# Patient Record
Sex: Female | Born: 1960 | Race: White | Hispanic: No | Smoking: Former smoker
Health system: Southern US, Community
[De-identification: ages and names within clinical notes are randomized; demographics above are authoritative.]

## PROBLEM LIST (undated history)

## (undated) DIAGNOSIS — E782 Mixed hyperlipidemia: Secondary | ICD-10-CM

## (undated) DIAGNOSIS — G43009 Migraine without aura, not intractable, without status migrainosus: Secondary | ICD-10-CM

## (undated) DIAGNOSIS — L551 Sunburn of second degree: Secondary | ICD-10-CM

## (undated) DIAGNOSIS — I10 Essential (primary) hypertension: Secondary | ICD-10-CM

## (undated) DIAGNOSIS — J440 Chronic obstructive pulmonary disease with acute lower respiratory infection: Secondary | ICD-10-CM

## (undated) DIAGNOSIS — C50311 Malignant neoplasm of lower-inner quadrant of right female breast: Secondary | ICD-10-CM

## (undated) HISTORY — DX: Essential (primary) hypertension: I10

## (undated) HISTORY — DX: Sunburn of second degree: L55.1

## (undated) HISTORY — PX: ABDOMINAL HYSTERECTOMY: SHX81

## (undated) HISTORY — DX: Chronic obstructive pulmonary disease with (acute) lower respiratory infection: J44.0

## (undated) HISTORY — DX: Mixed hyperlipidemia: E78.2

## (undated) HISTORY — DX: Malignant neoplasm of lower-inner quadrant of right female breast: C50.311

## (undated) HISTORY — PX: OTHER SURGICAL HISTORY: SHX169

## (undated) HISTORY — DX: Migraine without aura, not intractable, without status migrainosus: G43.009

## (undated) HISTORY — PX: NASAL HEMORRHAGE CONTROL: SHX287

## (undated) HISTORY — PX: CHOLECYSTECTOMY: SHX55

---

## 1998-12-08 HISTORY — PX: CARDIAC CATHETERIZATION: SHX172

## 2016-03-10 DIAGNOSIS — Z6821 Body mass index (BMI) 21.0-21.9, adult: Secondary | ICD-10-CM | POA: Diagnosis not present

## 2016-03-10 DIAGNOSIS — H906 Mixed conductive and sensorineural hearing loss, bilateral: Secondary | ICD-10-CM | POA: Diagnosis not present

## 2016-03-10 DIAGNOSIS — H9190 Unspecified hearing loss, unspecified ear: Secondary | ICD-10-CM | POA: Diagnosis not present

## 2016-03-10 DIAGNOSIS — H90A31 Mixed conductive and sensorineural hearing loss, unilateral, right ear with restricted hearing on the contralateral side: Secondary | ICD-10-CM | POA: Diagnosis not present

## 2016-03-10 DIAGNOSIS — Z974 Presence of external hearing-aid: Secondary | ICD-10-CM | POA: Diagnosis not present

## 2016-03-10 DIAGNOSIS — H90A22 Sensorineural hearing loss, unilateral, left ear, with restricted hearing on the contralateral side: Secondary | ICD-10-CM | POA: Diagnosis not present

## 2016-04-01 DIAGNOSIS — J9801 Acute bronchospasm: Secondary | ICD-10-CM | POA: Diagnosis not present

## 2016-04-01 DIAGNOSIS — J069 Acute upper respiratory infection, unspecified: Secondary | ICD-10-CM | POA: Diagnosis not present

## 2016-04-09 DIAGNOSIS — R290 Tetany: Secondary | ICD-10-CM | POA: Diagnosis not present

## 2016-04-09 DIAGNOSIS — G4452 New daily persistent headache (NDPH): Secondary | ICD-10-CM | POA: Diagnosis not present

## 2016-04-28 DIAGNOSIS — L550 Sunburn of first degree: Secondary | ICD-10-CM | POA: Diagnosis not present

## 2016-06-02 DIAGNOSIS — R1084 Generalized abdominal pain: Secondary | ICD-10-CM | POA: Diagnosis not present

## 2016-06-02 DIAGNOSIS — R0789 Other chest pain: Secondary | ICD-10-CM | POA: Diagnosis not present

## 2016-06-02 DIAGNOSIS — R05 Cough: Secondary | ICD-10-CM | POA: Diagnosis not present

## 2016-06-02 DIAGNOSIS — R0602 Shortness of breath: Secondary | ICD-10-CM | POA: Diagnosis not present

## 2016-06-02 DIAGNOSIS — R079 Chest pain, unspecified: Secondary | ICD-10-CM | POA: Diagnosis not present

## 2016-06-16 DIAGNOSIS — E782 Mixed hyperlipidemia: Secondary | ICD-10-CM | POA: Diagnosis not present

## 2016-06-16 DIAGNOSIS — G43009 Migraine without aura, not intractable, without status migrainosus: Secondary | ICD-10-CM | POA: Diagnosis not present

## 2016-06-16 DIAGNOSIS — F419 Anxiety disorder, unspecified: Secondary | ICD-10-CM | POA: Diagnosis not present

## 2016-06-16 DIAGNOSIS — I10 Essential (primary) hypertension: Secondary | ICD-10-CM | POA: Diagnosis not present

## 2016-06-16 DIAGNOSIS — R5383 Other fatigue: Secondary | ICD-10-CM | POA: Diagnosis not present

## 2016-06-16 DIAGNOSIS — L821 Other seborrheic keratosis: Secondary | ICD-10-CM | POA: Diagnosis not present

## 2016-07-03 DIAGNOSIS — R290 Tetany: Secondary | ICD-10-CM | POA: Diagnosis not present

## 2016-07-03 DIAGNOSIS — Z6820 Body mass index (BMI) 20.0-20.9, adult: Secondary | ICD-10-CM | POA: Diagnosis not present

## 2016-07-03 DIAGNOSIS — G43009 Migraine without aura, not intractable, without status migrainosus: Secondary | ICD-10-CM | POA: Diagnosis not present

## 2016-08-13 DIAGNOSIS — L820 Inflamed seborrheic keratosis: Secondary | ICD-10-CM | POA: Diagnosis not present

## 2016-08-13 DIAGNOSIS — L821 Other seborrheic keratosis: Secondary | ICD-10-CM | POA: Diagnosis not present

## 2016-08-13 DIAGNOSIS — D2239 Melanocytic nevi of other parts of face: Secondary | ICD-10-CM | POA: Diagnosis not present

## 2016-10-16 DIAGNOSIS — R799 Abnormal finding of blood chemistry, unspecified: Secondary | ICD-10-CM | POA: Diagnosis not present

## 2016-10-16 DIAGNOSIS — E782 Mixed hyperlipidemia: Secondary | ICD-10-CM | POA: Diagnosis not present

## 2016-10-23 DIAGNOSIS — J9801 Acute bronchospasm: Secondary | ICD-10-CM | POA: Diagnosis not present

## 2016-10-23 DIAGNOSIS — J060 Acute laryngopharyngitis: Secondary | ICD-10-CM | POA: Diagnosis not present

## 2016-10-23 DIAGNOSIS — J069 Acute upper respiratory infection, unspecified: Secondary | ICD-10-CM | POA: Diagnosis not present

## 2016-12-19 DIAGNOSIS — J180 Bronchopneumonia, unspecified organism: Secondary | ICD-10-CM | POA: Diagnosis not present

## 2016-12-19 DIAGNOSIS — J111 Influenza due to unidentified influenza virus with other respiratory manifestations: Secondary | ICD-10-CM | POA: Diagnosis not present

## 2016-12-30 DIAGNOSIS — G43009 Migraine without aura, not intractable, without status migrainosus: Secondary | ICD-10-CM | POA: Diagnosis not present

## 2016-12-30 DIAGNOSIS — I10 Essential (primary) hypertension: Secondary | ICD-10-CM | POA: Diagnosis not present

## 2016-12-30 DIAGNOSIS — F411 Generalized anxiety disorder: Secondary | ICD-10-CM | POA: Diagnosis not present

## 2016-12-30 DIAGNOSIS — R5383 Other fatigue: Secondary | ICD-10-CM | POA: Diagnosis not present

## 2016-12-30 DIAGNOSIS — E782 Mixed hyperlipidemia: Secondary | ICD-10-CM | POA: Diagnosis not present

## 2016-12-30 DIAGNOSIS — K219 Gastro-esophageal reflux disease without esophagitis: Secondary | ICD-10-CM | POA: Diagnosis not present

## 2017-01-14 DIAGNOSIS — R0789 Other chest pain: Secondary | ICD-10-CM | POA: Diagnosis not present

## 2017-01-14 DIAGNOSIS — Z79899 Other long term (current) drug therapy: Secondary | ICD-10-CM | POA: Diagnosis not present

## 2017-01-14 DIAGNOSIS — R079 Chest pain, unspecified: Secondary | ICD-10-CM | POA: Diagnosis not present

## 2017-01-14 DIAGNOSIS — M79601 Pain in right arm: Secondary | ICD-10-CM | POA: Diagnosis not present

## 2017-05-19 DIAGNOSIS — L551 Sunburn of second degree: Secondary | ICD-10-CM | POA: Diagnosis not present

## 2017-05-25 DIAGNOSIS — W1789XA Other fall from one level to another, initial encounter: Secondary | ICD-10-CM | POA: Diagnosis not present

## 2017-05-25 DIAGNOSIS — S99922A Unspecified injury of left foot, initial encounter: Secondary | ICD-10-CM | POA: Diagnosis not present

## 2017-05-25 DIAGNOSIS — M79672 Pain in left foot: Secondary | ICD-10-CM | POA: Diagnosis not present

## 2017-05-25 DIAGNOSIS — S0990XA Unspecified injury of head, initial encounter: Secondary | ICD-10-CM | POA: Diagnosis not present

## 2017-07-20 DIAGNOSIS — I10 Essential (primary) hypertension: Secondary | ICD-10-CM | POA: Diagnosis not present

## 2017-07-20 DIAGNOSIS — E039 Hypothyroidism, unspecified: Secondary | ICD-10-CM | POA: Diagnosis not present

## 2017-07-20 DIAGNOSIS — E782 Mixed hyperlipidemia: Secondary | ICD-10-CM | POA: Diagnosis not present

## 2017-07-31 DIAGNOSIS — E782 Mixed hyperlipidemia: Secondary | ICD-10-CM | POA: Diagnosis not present

## 2017-07-31 DIAGNOSIS — G43009 Migraine without aura, not intractable, without status migrainosus: Secondary | ICD-10-CM | POA: Diagnosis not present

## 2017-07-31 DIAGNOSIS — J060 Acute laryngopharyngitis: Secondary | ICD-10-CM | POA: Diagnosis not present

## 2017-07-31 DIAGNOSIS — F411 Generalized anxiety disorder: Secondary | ICD-10-CM | POA: Diagnosis not present

## 2017-08-13 DIAGNOSIS — R197 Diarrhea, unspecified: Secondary | ICD-10-CM | POA: Diagnosis not present

## 2017-08-16 DIAGNOSIS — R197 Diarrhea, unspecified: Secondary | ICD-10-CM | POA: Diagnosis not present

## 2017-09-04 DIAGNOSIS — R197 Diarrhea, unspecified: Secondary | ICD-10-CM | POA: Diagnosis not present

## 2017-09-04 DIAGNOSIS — Z1211 Encounter for screening for malignant neoplasm of colon: Secondary | ICD-10-CM | POA: Diagnosis not present

## 2017-09-07 LAB — HM COLONOSCOPY

## 2017-09-22 DIAGNOSIS — K589 Irritable bowel syndrome without diarrhea: Secondary | ICD-10-CM | POA: Diagnosis not present

## 2017-09-28 DIAGNOSIS — Z1231 Encounter for screening mammogram for malignant neoplasm of breast: Secondary | ICD-10-CM | POA: Diagnosis not present

## 2017-10-06 DIAGNOSIS — R922 Inconclusive mammogram: Secondary | ICD-10-CM | POA: Diagnosis not present

## 2017-10-06 DIAGNOSIS — R928 Other abnormal and inconclusive findings on diagnostic imaging of breast: Secondary | ICD-10-CM | POA: Diagnosis not present

## 2017-11-03 DIAGNOSIS — I10 Essential (primary) hypertension: Secondary | ICD-10-CM | POA: Diagnosis not present

## 2017-11-03 DIAGNOSIS — R5383 Other fatigue: Secondary | ICD-10-CM | POA: Diagnosis not present

## 2017-11-03 DIAGNOSIS — K219 Gastro-esophageal reflux disease without esophagitis: Secondary | ICD-10-CM | POA: Diagnosis not present

## 2017-11-03 DIAGNOSIS — E782 Mixed hyperlipidemia: Secondary | ICD-10-CM | POA: Diagnosis not present

## 2017-11-03 DIAGNOSIS — J060 Acute laryngopharyngitis: Secondary | ICD-10-CM | POA: Diagnosis not present

## 2018-03-01 DIAGNOSIS — L2089 Other atopic dermatitis: Secondary | ICD-10-CM | POA: Diagnosis not present

## 2018-04-15 DIAGNOSIS — J060 Acute laryngopharyngitis: Secondary | ICD-10-CM | POA: Diagnosis not present

## 2018-05-06 DIAGNOSIS — R5383 Other fatigue: Secondary | ICD-10-CM | POA: Diagnosis not present

## 2018-05-06 DIAGNOSIS — E782 Mixed hyperlipidemia: Secondary | ICD-10-CM | POA: Diagnosis not present

## 2018-05-06 DIAGNOSIS — L551 Sunburn of second degree: Secondary | ICD-10-CM | POA: Diagnosis not present

## 2018-05-06 DIAGNOSIS — K219 Gastro-esophageal reflux disease without esophagitis: Secondary | ICD-10-CM | POA: Diagnosis not present

## 2018-05-06 DIAGNOSIS — I10 Essential (primary) hypertension: Secondary | ICD-10-CM | POA: Diagnosis not present

## 2018-07-05 DIAGNOSIS — E034 Atrophy of thyroid (acquired): Secondary | ICD-10-CM | POA: Diagnosis not present

## 2018-08-03 DIAGNOSIS — L821 Other seborrheic keratosis: Secondary | ICD-10-CM | POA: Diagnosis not present

## 2018-08-03 DIAGNOSIS — L728 Other follicular cysts of the skin and subcutaneous tissue: Secondary | ICD-10-CM | POA: Diagnosis not present

## 2018-08-03 DIAGNOSIS — L739 Follicular disorder, unspecified: Secondary | ICD-10-CM | POA: Diagnosis not present

## 2018-10-22 DIAGNOSIS — E038 Other specified hypothyroidism: Secondary | ICD-10-CM | POA: Diagnosis not present

## 2018-10-22 DIAGNOSIS — E782 Mixed hyperlipidemia: Secondary | ICD-10-CM | POA: Diagnosis not present

## 2018-10-22 DIAGNOSIS — I10 Essential (primary) hypertension: Secondary | ICD-10-CM | POA: Diagnosis not present

## 2018-10-25 DIAGNOSIS — K219 Gastro-esophageal reflux disease without esophagitis: Secondary | ICD-10-CM | POA: Diagnosis not present

## 2018-10-25 DIAGNOSIS — E782 Mixed hyperlipidemia: Secondary | ICD-10-CM | POA: Diagnosis not present

## 2018-10-25 DIAGNOSIS — I10 Essential (primary) hypertension: Secondary | ICD-10-CM | POA: Diagnosis not present

## 2018-11-11 DIAGNOSIS — L820 Inflamed seborrheic keratosis: Secondary | ICD-10-CM | POA: Diagnosis not present

## 2018-11-11 DIAGNOSIS — L728 Other follicular cysts of the skin and subcutaneous tissue: Secondary | ICD-10-CM | POA: Diagnosis not present

## 2019-02-11 DIAGNOSIS — Z1231 Encounter for screening mammogram for malignant neoplasm of breast: Secondary | ICD-10-CM | POA: Diagnosis not present

## 2019-02-11 LAB — HM MAMMOGRAPHY: HM Mammogram: NORMAL (ref 0–4)

## 2019-05-17 DIAGNOSIS — L551 Sunburn of second degree: Secondary | ICD-10-CM | POA: Diagnosis not present

## 2019-09-26 DIAGNOSIS — I10 Essential (primary) hypertension: Secondary | ICD-10-CM | POA: Diagnosis not present

## 2019-09-26 DIAGNOSIS — K219 Gastro-esophageal reflux disease without esophagitis: Secondary | ICD-10-CM | POA: Diagnosis not present

## 2019-09-26 DIAGNOSIS — E782 Mixed hyperlipidemia: Secondary | ICD-10-CM | POA: Diagnosis not present

## 2019-09-26 DIAGNOSIS — Z23 Encounter for immunization: Secondary | ICD-10-CM | POA: Diagnosis not present

## 2019-09-30 DIAGNOSIS — E782 Mixed hyperlipidemia: Secondary | ICD-10-CM | POA: Diagnosis not present

## 2019-09-30 DIAGNOSIS — R5383 Other fatigue: Secondary | ICD-10-CM | POA: Diagnosis not present

## 2019-09-30 DIAGNOSIS — E038 Other specified hypothyroidism: Secondary | ICD-10-CM | POA: Diagnosis not present

## 2020-01-26 ENCOUNTER — Other Ambulatory Visit: Payer: Self-pay | Admitting: Physician Assistant

## 2020-01-26 ENCOUNTER — Other Ambulatory Visit: Payer: Self-pay | Admitting: Family Medicine

## 2020-02-29 ENCOUNTER — Other Ambulatory Visit: Payer: Self-pay | Admitting: Physician Assistant

## 2020-03-26 ENCOUNTER — Ambulatory Visit: Payer: Self-pay | Admitting: Physician Assistant

## 2020-03-30 ENCOUNTER — Ambulatory Visit: Payer: Self-pay | Admitting: Physician Assistant

## 2020-03-30 ENCOUNTER — Other Ambulatory Visit: Payer: Self-pay | Admitting: Physician Assistant

## 2020-04-10 ENCOUNTER — Ambulatory Visit: Payer: Self-pay | Admitting: Physician Assistant

## 2020-04-19 ENCOUNTER — Ambulatory Visit: Payer: 59 | Admitting: Physician Assistant

## 2020-04-19 ENCOUNTER — Other Ambulatory Visit: Payer: Self-pay

## 2020-04-19 ENCOUNTER — Encounter: Payer: Self-pay | Admitting: Physician Assistant

## 2020-04-19 VITALS — BP 108/62 | HR 70 | Temp 97.5°F | Resp 16 | Wt 125.0 lb

## 2020-04-19 DIAGNOSIS — E038 Other specified hypothyroidism: Secondary | ICD-10-CM | POA: Insufficient documentation

## 2020-04-19 DIAGNOSIS — Z23 Encounter for immunization: Secondary | ICD-10-CM

## 2020-04-19 DIAGNOSIS — Z1231 Encounter for screening mammogram for malignant neoplasm of breast: Secondary | ICD-10-CM

## 2020-04-19 DIAGNOSIS — E782 Mixed hyperlipidemia: Secondary | ICD-10-CM | POA: Insufficient documentation

## 2020-04-19 DIAGNOSIS — J060 Acute laryngopharyngitis: Secondary | ICD-10-CM | POA: Insufficient documentation

## 2020-04-19 DIAGNOSIS — F419 Anxiety disorder, unspecified: Secondary | ICD-10-CM

## 2020-04-19 DIAGNOSIS — D229 Melanocytic nevi, unspecified: Secondary | ICD-10-CM

## 2020-04-19 HISTORY — DX: Other specified hypothyroidism: E03.8

## 2020-04-19 HISTORY — DX: Acute laryngopharyngitis: J06.0

## 2020-04-19 HISTORY — DX: Melanocytic nevi, unspecified: D22.9

## 2020-04-19 HISTORY — DX: Encounter for immunization: Z23

## 2020-04-19 HISTORY — DX: Anxiety disorder, unspecified: F41.9

## 2020-04-19 MED ORDER — TRIAMCINOLONE ACETONIDE 40 MG/ML IJ SUSP
60.0000 mg | Freq: Once | INTRAMUSCULAR | Status: AC
  Administered 2020-04-19: 60 mg via INTRAMUSCULAR

## 2020-04-19 MED ORDER — ALBUTEROL SULFATE HFA 108 (90 BASE) MCG/ACT IN AERS
2.0000 | INHALATION_SPRAY | Freq: Four times a day (QID) | RESPIRATORY_TRACT | 2 refills | Status: DC | PRN
Start: 2020-04-19 — End: 2021-11-05

## 2020-04-19 MED ORDER — AZITHROMYCIN 250 MG PO TABS: ORAL_TABLET | ORAL | 0 refills | Status: DC

## 2020-04-19 NOTE — Assessment & Plan Note (Signed)
Well controlled.  ?No changes to medicines.  ?Continue to work on eating a healthy diet and exercise.  ?Labs drawn today.  ?

## 2020-04-19 NOTE — Assessment & Plan Note (Signed)
Continue current meds as directed 

## 2020-04-19 NOTE — Assessment & Plan Note (Signed)
Kenalog 60 given IM otc decongestants rx for zpack

## 2020-04-19 NOTE — Assessment & Plan Note (Signed)
Referral to dermatology.  

## 2020-04-19 NOTE — Assessment & Plan Note (Signed)
Mammogram scheduled.

## 2020-04-19 NOTE — Progress Notes (Signed)
Established Patient Office Visit  Subjective:  Patient ID: Lori Wise, female    DOB: [birth year]  Age: 59 y.o. MRN: [id number]  CC:  Chief Complaint  Patient presents with  . Hypothyroidism  . Hyperlipidemia  . Hypertension    HPI Lori Wise presents for follow up hypertension  Pt presents for follow up of hypertension. . The patient is tolerating the medication well without side effects. Compliance with treatment has been good; including taking medication as directed , maintains a healthy diet and regular exercise regimen , and following up as directed. Is currently on tenormin 50m qd  Mixed hyperlipidemia  Pt presents with hyperlipidemia.  Compliance with treatment has been good The patient is compliant with medications, maintains a low cholesterol diet , follows up as directed , and maintains an exercise regimen . The patient denies experiencing any hypercholesterolemia related symptoms. Currently on fish oil and zetia  Pt with history of anxiety - doing well on xanax as needed   Pt complains of uri symptoms and wheezing - has been going on for a awhile - has run out of her albuterol   Pt has several moles on her back and body - would like to have referral to dermatology for further evaluation  Pt would like to be scheduled for screening mammogram  Past Medical History:  Diagnosis Date  . [place]   . Essential (primary) hypertension   . Migraine without aura, not intractable, without status migrainosus   . Mixed hyperlipidemia   . Sunburn of second degree     Past Surgical History:  Procedure Laterality Date  . ABDOMINAL HYSTERECTOMY    . CARDIAC CATHETERIZATION  2000   normal  . CHOLECYSTECTOMY    . OTHER SURGICAL HISTORY     Ear surgeries x5    Family History  Problem Relation Age of Onset  . Suicidality Mother   . Lung cancer Father     Social History   Socioeconomic  History  . Marital status: Legally Separated    Spouse name: Not on file  . Number of children: 2  . Years of education: Not on file  . Highest education level: Not on file  Occupational History  . Occupation: [employer] service  Tobacco Use  . Smoking status: Current Every Day Smoker    Packs/day: 1.00    Types: Cigarettes  . Smokeless tobacco: Never Used  Substance and Sexual Activity  . Alcohol use: Not on file    Comment: Drinks approximately two times per week.  . Drug use: Never  . Sexual activity: Not on file  Other Topics Concern  . Not on file  Social History Narrative  . Not on file   Social Determinants of Health   Financial Resource Strain:   . Difficulty of Paying Living Expenses:   Food Insecurity:   . Worried About RCharity fundraiserin the Last Year:   . RArboriculturistin the Last Year:   Transportation Needs:   . LFilm/video editor(Medical):   .Marland KitchenLack of Transportation (Non-Medical):   Physical Activity:   . Days of Exercise per Week:   . Minutes of Exercise per Session:   Stress:   . Feeling of Stress :   Social Connections:   . Frequency of Communication with Friends and Family:   . Frequency of Social Gatherings with Friends and Family:   . Attends Religious Services:   .  Active Member of Clubs or Organizations:   . Attends Archivist Meetings:   Marland Kitchen Marital Status:   Intimate Partner Violence:   . Fear of Current or Ex-Partner:   . Emotionally Abused:   Marland Kitchen Physically Abused:   . Sexually Abused:      Current Outpatient Medications:  .  albuterol (VENTOLIN HFA) 108 (90 Base) MCG/ACT inhaler, Inhale 2 puffs into the lungs every 6 (six) hours as needed for wheezing or shortness of breath., Disp: 8 g, Rfl: 2 .  ALPRAZolam (XANAX) 0.5 MG tablet, TAKE ONE TABLET BY MOUTH 3 TIMES DAILY, Disp: 90 tablet, Rfl: 3 .  aspirin 81 MG chewable tablet, Chew 81 mg by mouth daily., Disp: , Rfl:  .  atenolol (TENORMIN) 25 MG tablet, TAKE 2  TABLETS BY MOUTH EVERY DAY *MUST HAVE APPOINTMENT*, Disp: 60 tablet, Rfl: 2 .  azithromycin (ZITHROMAX) 250 MG tablet, 2 po day one then 1 po days 2-5, Disp: 6 tablet, Rfl: 0 .  ezetimibe (ZETIA) 10 MG tablet, TAKE ONE TABLET BY MOUTH DAILY, Disp: 30 tablet, Rfl: 5 .  ibuprofen (ADVIL) 800 MG tablet, Take 800 mg by mouth every 8 (eight) hours as needed., Disp: , Rfl:  .  levothyroxine (SYNTHROID) 25 MCG tablet, TAKE ONE TABLET BY MOUTH EVERY DAY, Disp: 30 tablet, Rfl: 2 .  Omega-3 1000 MG CAPS, Take by mouth., Disp: , Rfl:  .  omeprazole (PRILOSEC) 20 MG capsule, TAKE ONE CAPSULE BY MOUTH EVERY DAY, Disp: 90 capsule, Rfl: 1 .  PREMARIN 0.625 MG tablet, Take 0.625 mg by mouth daily., Disp: , Rfl:    Allergies  Allergen Reactions  . Codeine Hives, Itching, Other (See Comments), Rash and Nausea And Vomiting    ROS CONSTITUTIONAL: Negative for chills, fatigue, fever, unintentional weight gain and unintentional weight loss.  E/N/T: see ENT CARDIOVASCULAR: Negative for chest pain, dizziness, palpitations and pedal edema.  RESPIRATORY: see ENT GASTROINTESTINAL: Negative for abdominal pain, acid reflux symptoms, constipation, diarrhea, nausea and vomiting.  MSK: Negative for arthralgias and myalgias.  INTEGUMENTARY: Negative for rash.  NEUROLOGICAL: Negative for dizziness and headaches.  PSYCHIATRIC: Negative for sleep disturbance and to question depression screen.  Negative for depression, negative for anhedonia.        Objective:    PHYSICAL EXAM:   VS: BP 108/62   Pulse 70   Temp (!) 97.5 F (36.4 C)   Resp 16   Wt 125 lb (56.7 kg)   SpO2 98%   GEN: Well nourished, well developed, in no acute distress  HEENT: normal external ears and nose - normal external auditory canals and TMS - hearing grossly normal -  - Lips, Teeth and Gums - normal  Oropharynx - normal mucosa, palate, and posterior pharynx Neck: no JVD or masses - no thyromegaly Cardiac: RRR; no murmurs, rubs, or  gallops,no edema - no significant varicosities Respiratory:  normal respiratory rate and pattern with no distress -scattered wheezes noted  MS: no deformity or atrophy  Skin: warm and dry, no rash -  Neuro:  Alert and Oriented x 3, Strength and sensation are intact - CN II-Xii grossly intact Psych: euthymic mood, appropriate affect and demeanor  BP 108/62   Pulse 70   Temp (!) 97.5 F (36.4 C)   Resp 16   Wt 125 lb (56.7 kg)   SpO2 98%  Wt Readings from Last 3 Encounters:  04/19/20 125 lb (56.7 kg)     Health Maintenance Due  Topic Date Due  .  Hepatitis C Screening  Never done  . HIV Screening  Never done  . COVID-19 Vaccine (1) Never done  . PAP SMEAR-Modifier  Never done    There are no preventive care reminders to display for this patient.  No results found for: TSH No results found for: WBC, HGB, HCT, MCV, PLT No results found for: NA, K, CHLORIDE, CO2, GLUCOSE, BUN, CREATININE, BILITOT, ALKPHOS, AST, ALT, PROT, ALBUMIN, CALCIUM, ANIONGAP, EGFR, GFR No results found for: CHOL No results found for: HDL No results found for: LDLCALC No results found for: TRIG No results found for: CHOLHDL No results found for: HGBA1C    Assessment & Plan:   Problem List Items Addressed This Visit      Respiratory   Acute laryngopharyngitis    Kenalog 60 given IM otc decongestants rx for zpack      Relevant Medications   azithromycin (ZITHROMAX) 250 MG tablet     Endocrine   Other specified hypothyroidism    Labs pending Continue current dose of med      Relevant Orders   TSH     Musculoskeletal and Integument   Atypical nevus    Referral to dermatology      Relevant Orders   Ambulatory referral to Dermatology     Other   Mixed hyperlipidemia - Primary    Well controlled.  No changes to medicines.  Continue to work on eating a healthy diet and exercise.  Labs drawn today.        Relevant Orders   CBC with Differential/Platelet   Comprehensive  metabolic panel   Lipid panel   Anxiety    Continue current meds as directed      Visit for screening mammogram    Mammogram scheduled      Relevant Orders   MM Digital Screening      Meds ordered this encounter  Medications  . triamcinolone acetonide (KENALOG-40) injection 60 mg  . albuterol (VENTOLIN HFA) 108 (90 Base) MCG/ACT inhaler    Sig: Inhale 2 puffs into the lungs every 6 (six) hours as needed for wheezing or shortness of breath.    Dispense:  8 g    Refill:  2    Order Specific Question:   Supervising Provider    AnswerRochel Brome [id number]  . azithromycin (ZITHROMAX) 250 MG tablet    Sig: 2 po day one then 1 po days 2-5    Dispense:  6 tablet    Refill:  0    Order Specific Question:   Supervising Provider    AnswerShelton Silvas    Follow-up: Return in about 6 months (around 10/20/2020) for chronic fasting follow up.    SARA R Gibran Veselka, [name]

## 2020-04-19 NOTE — Assessment & Plan Note (Signed)
Labs pending Continue current dose of med

## 2020-04-20 LAB — COMPREHENSIVE METABOLIC PANEL
ALT: 7 IU/L (ref 0–32)
AST: 13 IU/L (ref 0–40)
Albumin/Globulin Ratio: 2.2 (ref 1.2–2.2)
Albumin: 4.4 g/dL (ref 3.8–4.9)
Alkaline Phosphatase: 74 IU/L (ref 39–117)
BUN/Creatinine Ratio: 14 (ref 12–28)
BUN: 10 mg/dL (ref 8–27)
Bilirubin Total: 0.3 mg/dL (ref 0.0–1.2)
CO2: 23 mmol/L (ref 20–29)
Calcium: 9.6 mg/dL (ref 8.7–10.3)
Chloride: 105 mmol/L (ref 96–106)
Creatinine, Ser: 0.69 mg/dL (ref 0.57–1.00)
GFR calc Af Amer: 109 mL/min/{1.73_m2} (ref >59)
GFR calc non Af Amer: 95 mL/min/{1.73_m2} (ref >59)
Globulin, Total: 2 g/dL (ref 1.5–4.5)
Glucose: 89 mg/dL (ref 65–99)
Potassium: 4.2 mmol/L (ref 3.5–5.2)
Sodium: 140 mmol/L (ref 134–144)
Total Protein: 6.4 g/dL (ref 6.0–8.5)

## 2020-04-20 LAB — CBC WITH DIFFERENTIAL/PLATELET
Basophils Absolute: 0.1 10*3/uL (ref 0.0–0.2)
Basos: 1 %
EOS (ABSOLUTE): 0.1 10*3/uL (ref 0.0–0.4)
Eos: 1 %
Hematocrit: 39 % (ref 34.0–46.6)
Hemoglobin: 13.3 g/dL (ref 11.1–15.9)
Immature Grans (Abs): 0 10*3/uL (ref 0.0–0.1)
Immature Granulocytes: 0 %
Lymphocytes Absolute: 3.7 10*3/uL — ABNORMAL HIGH (ref 0.7–3.1)
Lymphs: 37 %
MCH: 30.9 pg (ref 26.6–33.0)
MCHC: 34.1 g/dL (ref 31.5–35.7)
MCV: 91 fL (ref 79–97)
Monocytes Absolute: 0.7 10*3/uL (ref 0.1–0.9)
Monocytes: 7 %
Neutrophils Absolute: 5.4 10*3/uL (ref 1.4–7.0)
Neutrophils: 54 %
Platelets: 340 10*3/uL (ref 150–450)
RBC: 4.3 x10E6/uL (ref 3.77–5.28)
RDW: 13 % (ref 11.7–15.4)
WBC: 10 10*3/uL (ref 3.4–10.8)

## 2020-04-20 LAB — LIPID PANEL
Chol/HDL Ratio: 3.6 ratio (ref 0.0–4.4)
Cholesterol, Total: 216 mg/dL — ABNORMAL HIGH (ref 100–199)
HDL: 60 mg/dL (ref >39)
LDL Chol Calc (NIH): 129 mg/dL — ABNORMAL HIGH (ref 0–99)
Triglycerides: 151 mg/dL — ABNORMAL HIGH (ref 0–149)
VLDL Cholesterol Cal: 27 mg/dL (ref 5–40)

## 2020-04-20 LAB — TSH: TSH: 1.47 u[IU]/mL (ref 0.450–4.500)

## 2020-04-20 LAB — CARDIOVASCULAR RISK ASSESSMENT

## 2020-04-30 ENCOUNTER — Telehealth (INDEPENDENT_AMBULATORY_CARE_PROVIDER_SITE_OTHER): Payer: 59 | Admitting: Physician Assistant

## 2020-04-30 ENCOUNTER — Encounter: Payer: Self-pay | Admitting: Physician Assistant

## 2020-04-30 ENCOUNTER — Other Ambulatory Visit: Payer: Self-pay | Admitting: Physician Assistant

## 2020-04-30 VITALS — BP 127/75 | HR 84 | Temp 98.0°F | Ht 64.0 in

## 2020-04-30 DIAGNOSIS — J40 Bronchitis, not specified as acute or chronic: Secondary | ICD-10-CM

## 2020-04-30 HISTORY — DX: Bronchitis, not specified as acute or chronic: J40

## 2020-04-30 MED ORDER — AMOXICILLIN-POT CLAVULANATE 875-125 MG PO TABS: 1.0000 | ORAL_TABLET | Freq: Two times a day (BID) | ORAL | 0 refills | Status: DC

## 2020-04-30 MED ORDER — BENZONATATE 200 MG PO CAPS: 200.0000 mg | ORAL_CAPSULE | Freq: Two times a day (BID) | ORAL | 1 refills | Status: DC | PRN

## 2020-04-30 MED ORDER — PREDNISONE 20 MG PO TABS: ORAL_TABLET | ORAL | 0 refills | Status: DC

## 2020-04-30 NOTE — Assessment & Plan Note (Signed)
rx for augmentin, prednisone and tessalon Follow up if any symptoms change or worsen

## 2020-04-30 NOTE — Progress Notes (Signed)
Virtual Visit via Telephone Note   This visit type was conducted due to national recommendations for restrictions regarding the COVID-19 Pandemic (e.g. social distancing) in an effort to limit this patient's exposure and mitigate transmission in our community.  Due to her co-morbid illnesses, this patient is at least at moderate risk for complications without adequate follow up.  This format is felt to be most appropriate for this patient at this time.  The patient did not have access to video technology/had technical difficulties with video requiring transitioning to audio format only (telephone).  All issues noted in this document were discussed and addressed.  No physical exam could be performed with this format.  Patient verbally consented to a telehealth visit.   Date:  04/30/2020   ID:  [name], DOB [birth year], MRN [id number]  Patient Location: Home Provider Location: Office  PCP:  Marge Duncans, [name]     Chief Complaint:  Cough and congestion  History of Present Illness:    Lori Wise is a 59 y.o. female with cough --- pt was seen in office last week with allergy and uri symptoms - she states her coughing has worsened and now having more congestion in chest - she states she has not had a fever, her congestion is clear and she has not had malaise  The patient does not have symptoms concerning for COVID-19 infection (fever, chills, cough, or new shortness of breath).    Past Medical History:  Diagnosis Date  . Chronic obstructive pulmonary disease with (acute) lower respiratory infection ([place])   . Essential (primary) hypertension   . Migraine without aura, not intractable, without status migrainosus   . Mixed hyperlipidemia   . Sunburn of second degree    Past Surgical History:  Procedure Laterality Date  . ABDOMINAL HYSTERECTOMY    . CARDIAC CATHETERIZATION  2000   normal  . CHOLECYSTECTOMY    . OTHER SURGICAL HISTORY     Ear surgeries x5     Current Meds   Medication Sig  . albuterol (VENTOLIN HFA) 108 (90 Base) MCG/ACT inhaler Inhale 2 puffs into the lungs every 6 (six) hours as needed for wheezing or shortness of breath.  . ALPRAZolam (XANAX) 0.5 MG tablet TAKE ONE TABLET BY MOUTH 3 TIMES DAILY  . aspirin 81 MG chewable tablet Chew 81 mg by mouth daily.  Marland Kitchen atenolol (TENORMIN) 25 MG tablet TAKE 2 TABLETS BY MOUTH EVERY DAY *MUST HAVE APPOINTMENT*  . ezetimibe (ZETIA) 10 MG tablet TAKE ONE TABLET BY MOUTH DAILY  . ibuprofen (ADVIL) 800 MG tablet Take 800 mg by mouth every 8 (eight) hours as needed.  Marland Kitchen levothyroxine (SYNTHROID) 25 MCG tablet TAKE ONE TABLET BY MOUTH EVERY DAY  . Omega-3 1000 MG CAPS Take by mouth.  Marland Kitchen omeprazole (PRILOSEC) 20 MG capsule TAKE ONE CAPSULE BY MOUTH EVERY DAY  . PREMARIN 0.625 MG tablet Take 0.625 mg by mouth daily.     Allergies:   Codeine   Social History   Tobacco Use  . Smoking status: Current Every Day Smoker    Packs/day: 1.00    Types: Cigarettes  . Smokeless tobacco: Never Used  Substance Use Topics  . Alcohol use: Not on file    Comment: Drinks approximately two times per week.  . Drug use: Never     Family Hx: The patient's family history includes Lung cancer in her father; Suicidality in her mother.  ROS:   Please see the history of present illness.  All other systems reviewed and are negative.  Labs/Other Tests and Data Reviewed:    Recent Labs: 04/19/2020: ALT 7; BUN 10; Creatinine, Ser 0.69; Hemoglobin 13.3; Platelets 340; Potassium 4.2; Sodium 140; TSH 1.470   Recent Lipid Panel Lab Results  Component Value Date/Time   CHOL 216 (H) 04/19/2020 10:57 AM   TRIG 151 (H) 04/19/2020 10:57 AM   HDL 60 04/19/2020 10:57 AM   CHOLHDL 3.6 04/19/2020 10:57 AM   LDLCALC 129 (H) 04/19/2020 10:57 AM    Wt Readings from Last 3 Encounters:  04/19/20 125 lb (56.7 kg)     Objective:    Vital Signs:  BP 127/75   Pulse 84   Temp 98 F (36.7 C)   Ht 5\' 4"  (1.626 m)   BMI 21.46 kg/m     VITAL SIGNS:  reviewed  ASSESSMENT & PLAN:    1. Bronchitis --- will send in rx for augmentin, tessalon and prednisone --- to continue albuterol as directed -  Follow up if any symptoms change or worsen  COVID-19 Education: The signs and symptoms of COVID-19 were discussed with the patient and how to seek care for testing (follow up with PCP or arrange E-visit). The importance of social distancing was discussed today.  Time:   Today, I have spen10 minutes with the patient with telehealth technology discussing the above problems.     Medication Adjustments/Labs and Tests Ordered: Current medicines are reviewed at length with the patient today.  Concerns regarding medicines are outlined above.   Tests Ordered: No orders of the defined types were placed in this encounter.   Medication Changes: Meds ordered this encounter  Medications  . predniSONE (DELTASONE) 20 MG tablet    Sig: 1 po tid for 3 days 1 po bid for 3 days 1 po qd for 3 days    Dispense:  18 tablet    Refill:  0    Order Specific Question:   Supervising Provider    AnswerRochel Brome [id number]  . amoxicillin-clavulanate (AUGMENTIN) 875-125 MG tablet    Sig: Take 1 tablet by mouth 2 (two) times daily.    Dispense:  20 tablet    Refill:  0    Order Specific Question:   Supervising Provider    AnswerRochel Brome [id number]  . benzonatate (TESSALON) 200 MG capsule    Sig: Take 1 capsule (200 mg total) by mouth 2 (two) times daily as needed for cough.    Dispense:  20 capsule    Refill:  1    Order Specific Question:   Supervising Provider    AnswerShelton Silvas    Follow Up:  In Person prn  Signed, [name], [name]  04/30/2020 11:13 AM    [place]

## 2020-05-08 ENCOUNTER — Other Ambulatory Visit: Payer: Self-pay

## 2020-05-08 ENCOUNTER — Encounter: Payer: Self-pay | Admitting: Physician Assistant

## 2020-05-08 ENCOUNTER — Ambulatory Visit (INDEPENDENT_AMBULATORY_CARE_PROVIDER_SITE_OTHER): Payer: 59 | Admitting: Physician Assistant

## 2020-05-08 VITALS — BP 118/72 | HR 72 | Temp 98.4°F | Ht 64.0 in | Wt 122.8 lb

## 2020-05-08 DIAGNOSIS — J40 Bronchitis, not specified as acute or chronic: Secondary | ICD-10-CM

## 2020-05-08 MED ORDER — IPRATROPIUM-ALBUTEROL 0.5-2.5 (3) MG/3ML IN SOLN: RESPIRATORY_TRACT | 0 refills | Status: DC

## 2020-05-08 MED ORDER — PREDNISONE 20 MG PO TABS: ORAL_TABLET | ORAL | 0 refills | Status: DC

## 2020-05-08 MED ORDER — BUDESONIDE-FORMOTEROL FUMARATE 160-4.5 MCG/ACT IN AERO
2.0000 | INHALATION_SPRAY | Freq: Two times a day (BID) | RESPIRATORY_TRACT | 3 refills | Status: DC
Start: 2020-05-08 — End: 2020-08-24

## 2020-05-08 MED ORDER — CEFTRIAXONE SODIUM 1 G IJ SOLR: 1.0000 g | Freq: Once | INTRAMUSCULAR | Status: DC

## 2020-05-08 MED ORDER — LEVOFLOXACIN 500 MG PO TABS: 500.0000 mg | ORAL_TABLET | Freq: Every day | ORAL | 0 refills | Status: DC

## 2020-05-08 MED ORDER — BENZONATATE 200 MG PO CAPS: 200.0000 mg | ORAL_CAPSULE | Freq: Two times a day (BID) | ORAL | 1 refills | Status: DC | PRN

## 2020-05-08 NOTE — Progress Notes (Signed)
Acute Office Visit  Subjective:    Patient ID: Lori Wise, female    DOB: 06/30/1961, 59 y.o.   MRN: [id number]  Chief Complaint  Patient presents with  . Cough    HPI Patient is in today for follow up bronchitis Pt has almost completed Augmentin and steroid dosepak - she is still having cough and wheezing -  She has run out of medication for her nebulizer and is still smoking about 1ppd She has not had a fever - cough at times productive  Past Medical History:  Diagnosis Date  . Chronic obstructive pulmonary disease with (acute) lower respiratory infection ([place])   . Essential (primary) hypertension   . Migraine without aura, not intractable, without status migrainosus   . Mixed hyperlipidemia   . Sunburn of second degree     Past Surgical History:  Procedure Laterality Date  . ABDOMINAL HYSTERECTOMY    . CARDIAC CATHETERIZATION  2000   normal  . CHOLECYSTECTOMY    . OTHER SURGICAL HISTORY     Ear surgeries x5    Family History  Problem Relation Age of Onset  . Suicidality Mother   . Lung cancer Father     Social History   Socioeconomic History  . Marital status: Legally Separated    Spouse name: Not on file  . Number of children: 2  . Years of education: Not on file  . Highest education level: Not on file  Occupational History  . Occupation: [employer] service  Tobacco Use  . Smoking status: Current Every Day Smoker    Packs/day: 1.00    Types: Cigarettes  . Smokeless tobacco: Never Used  Substance and Sexual Activity  . Alcohol use: Not on file    Comment: Drinks approximately two times per week.  . Drug use: Never  . Sexual activity: Not on file  Other Topics Concern  . Not on file  Social History Narrative  . Not on file   Social Determinants of Health   Financial Resource Strain:   . Difficulty of Paying Living Expenses:   Food Insecurity:   . Worried About Charity fundraiser in the Last Year:   . Arboriculturist in the Last Year:     Transportation Needs:   . Film/video editor (Medical):   Marland Kitchen Lack of Transportation (Non-Medical):   Physical Activity:   . Days of Exercise per Week:   . Minutes of Exercise per Session:   Stress:   . Feeling of Stress :   Social Connections:   . Frequency of Communication with Friends and Family:   . Frequency of Social Gatherings with Friends and Family:   . Attends Religious Services:   . Active Member of Clubs or Organizations:   . Attends Archivist Meetings:   Marland Kitchen Marital Status:   Intimate Partner Violence:   . Fear of Current or Ex-Partner:   . Emotionally Abused:   Marland Kitchen Physically Abused:   . Sexually Abused:      Current Outpatient Medications:  .  albuterol (VENTOLIN HFA) 108 (90 Base) MCG/ACT inhaler, Inhale 2 puffs into the lungs every 6 (six) hours as needed for wheezing or shortness of breath., Disp: 8 g, Rfl: 2 .  ALPRAZolam (XANAX) 0.5 MG tablet, TAKE ONE TABLET BY MOUTH 3 TIMES DAILY, Disp: 90 tablet, Rfl: 3 .  amoxicillin-clavulanate (AUGMENTIN) 875-125 MG tablet, Take 1 tablet by mouth 2 (two) times daily., Disp: 20 tablet, Rfl: 0 .  Ascorbic Acid (VITAMIN C PO), Take by mouth., Disp: , Rfl:  .  aspirin 81 MG chewable tablet, Chew 81 mg by mouth daily., Disp: , Rfl:  .  atenolol (TENORMIN) 25 MG tablet, TAKE 2 TABLETS BY MOUTH EVERY DAY *MUST HAVE APPOINTMENT*, Disp: 60 tablet, Rfl: 2 .  benzonatate (TESSALON) 200 MG capsule, Take 1 capsule (200 mg total) by mouth 2 (two) times daily as needed for cough., Disp: 30 capsule, Rfl: 1 .  Cyanocobalamin (VITAMIN B-12 PO), Take by mouth., Disp: , Rfl:  .  ezetimibe (ZETIA) 10 MG tablet, TAKE ONE TABLET BY MOUTH DAILY, Disp: 30 tablet, Rfl: 5 .  ibuprofen (ADVIL) 800 MG tablet, Take 800 mg by mouth every 8 (eight) hours as needed., Disp: , Rfl:  .  levothyroxine (SYNTHROID) 25 MCG tablet, TAKE ONE TABLET BY MOUTH EVERY DAY, Disp: 30 tablet, Rfl: 2 .  Omega-3 1000 MG CAPS, Take by mouth., Disp: , Rfl:  .   omeprazole (PRILOSEC) 20 MG capsule, TAKE ONE CAPSULE BY MOUTH EVERY DAY, Disp: 90 capsule, Rfl: 1 .  PREMARIN 0.625 MG tablet, Take 0.625 mg by mouth daily., Disp: , Rfl:  .  VITAMIN E PO, Take by mouth., Disp: , Rfl:  .  ipratropium-albuterol (DUONEB) 0.5-2.5 (3) MG/3ML SOLN, 1 vial in neb q 4-6 hours prn, Disp: 360 mL, Rfl: 0 .  levofloxacin (LEVAQUIN) 500 MG tablet, Take 1 tablet (500 mg total) by mouth daily., Disp: 10 tablet, Rfl: 0 .  predniSONE (DELTASONE) 20 MG tablet, 1 po tid for 3 days 1 po bid for 3 days 1 po qd for 3 days, Disp: 18 tablet, Rfl: 0  Current Facility-Administered Medications:  .  cefTRIAXone (ROCEPHIN) injection 1 g, 1 g, Intramuscular, Once, Marge Duncans, [name]   Allergies  Allergen Reactions  . Codeine Hives, Itching, Other (See Comments), Rash and Nausea And Vomiting    CONSTITUTIONAL: Negative for chills, fatigue, fever, unintentional weight gain and unintentional weight loss.  E/N/T: Negative for ear pain, nasal congestion and sore throat.  CARDIOVASCULAR: Negative for chest pain, dizziness, palpitations and pedal edema.  RESPIRATORY: see HPI  NEUROLOGICAL: Negative for dizziness and headaches.  PSYCHIATRIC: Negative for sleep disturbance and to question depression screen.  Negative for depression, negative for anhedonia.         Objective:    PHYSICAL EXAM:   VS: BP 118/72 (BP Location: Left Arm, Patient Position: Sitting)   Pulse 72   Temp 98.4 F (36.9 C) (Temporal)   Ht 5\' 4"  (1.626 m)   Wt 122 lb 12.8 oz (55.7 kg)   SpO2 95%   BMI 21.08 kg/m   GEN: Well nourished, well developed, in no acute distress   Oropharynx - normal mucosa, palate, and posterior pharynx  Cardiac: RRR; no murmurs, rubs, or gallops,no edema - no significant varicosities Respiratory:  scattered rhonchi and wheezes noted  Skin: warm and dry, no rash  Neuro:  Alert and Oriented x 3, Strength and sensation are intact - CN II-Xii grossly intact Psych: euthymic mood,  appropriate affect and demeanor   Wt Readings from Last 3 Encounters:  05/08/20 122 lb 12.8 oz (55.7 kg)  04/19/20 125 lb (56.7 kg)    Health Maintenance Due  Topic Date Due  . Hepatitis C Screening  Never done  . COVID-19 Vaccine (1) Never done  . HIV Screening  Never done  . PAP SMEAR-Modifier  Never done    There are no preventive care reminders to display for this  patient.        Assessment & Plan:   Problem List Items Addressed This Visit      Respiratory   Bronchitis - Primary    Chest xray ordered Rocephin 1 gram IM rx for levaquin Sample given of breztri - 2 inh twice daily Use nebulizer as directed STOP SMOKING      Relevant Medications   cefTRIAXone (ROCEPHIN) injection 1 g (Start on 05/08/2020  2:00 PM)   predniSONE (DELTASONE) 20 MG tablet   levofloxacin (LEVAQUIN) 500 MG tablet   ipratropium-albuterol (DUONEB) 0.5-2.5 (3) MG/3ML SOLN   benzonatate (TESSALON) 200 MG capsule   Other Relevant Orders   DG Chest 2 View       Meds ordered this encounter  Medications  . cefTRIAXone (ROCEPHIN) injection 1 g  . predniSONE (DELTASONE) 20 MG tablet    Sig: 1 po tid for 3 days 1 po bid for 3 days 1 po qd for 3 days    Dispense:  18 tablet    Refill:  0    Order Specific Question:   Supervising Provider    AnswerRochel Brome [id number]  . levofloxacin (LEVAQUIN) 500 MG tablet    Sig: Take 1 tablet (500 mg total) by mouth daily.    Dispense:  10 tablet    Refill:  0    Order Specific Question:   Supervising Provider    AnswerRochel Brome [id number]  . ipratropium-albuterol (DUONEB) 0.5-2.5 (3) MG/3ML SOLN    Sig: 1 vial in neb q 4-6 hours prn    Dispense:  360 mL    Refill:  0    Order Specific Question:   Supervising Provider    AnswerRochel Brome [id number]  . benzonatate (TESSALON) 200 MG capsule    Sig: Take 1 capsule (200 mg total) by mouth 2 (two) times daily as needed for cough.    Dispense:  30 capsule    Refill:  1    Order Specific  Question:   Supervising Provider    Answer:   COX, KIRSTEN [id number]     [place], [name]

## 2020-05-08 NOTE — Assessment & Plan Note (Signed)
Chest xray ordered Rocephin 1 gram IM rx for levaquin Sample given of breztri - 2 inh twice daily Use nebulizer as directed STOP SMOKING

## 2020-05-22 ENCOUNTER — Ambulatory Visit: Payer: 59 | Admitting: Physician Assistant

## 2020-05-24 ENCOUNTER — Other Ambulatory Visit: Payer: Self-pay | Admitting: Physician Assistant

## 2020-05-28 ENCOUNTER — Other Ambulatory Visit: Payer: Self-pay

## 2020-05-28 ENCOUNTER — Ambulatory Visit: Payer: 59 | Admitting: Physician Assistant

## 2020-05-28 ENCOUNTER — Encounter: Payer: Self-pay | Admitting: Physician Assistant

## 2020-05-28 VITALS — BP 118/58 | HR 73 | Temp 97.5°F | Ht 64.0 in | Wt 127.0 lb

## 2020-05-28 DIAGNOSIS — J40 Bronchitis, not specified as acute or chronic: Secondary | ICD-10-CM

## 2020-05-28 NOTE — Progress Notes (Signed)
Acute Office Visit  Subjective:    Patient ID: Lori Wise, female    DOB: 07-Nov-1961, 59 y.o.   MRN: [id number]  Chief Complaint  Patient presents with   Bronchitis    2 week follow up    HPI Patient is in today for follow up bronchitis - chest xray that was done was normal Pt has finished her antibiotics and prednisone - overall states she is feeling better - not coughing as much or wheezing - states oxygen at home is normal She has had some fatigue (labs one month ago normal) but otherwise no concerns Is still smoking  Past Medical History:  Diagnosis Date   Chronic obstructive pulmonary disease with (acute) lower respiratory infection ([place])    Essential (primary) hypertension    Migraine without aura, not intractable, without status migrainosus    Mixed hyperlipidemia    Sunburn of second degree     Past Surgical History:  Procedure Laterality Date   ABDOMINAL HYSTERECTOMY     CARDIAC CATHETERIZATION  2000   normal   CHOLECYSTECTOMY     OTHER SURGICAL HISTORY     Ear surgeries x5    Family History  Problem Relation Age of Onset   Suicidality Mother    Lung cancer Father     Social History   Socioeconomic History   Marital status: Legally Separated    Spouse name: Not on file   Number of children: 2   Years of education: Not on file   Highest education level: Not on file  Occupational History   Occupation: [employer] service  Tobacco Use   Smoking status: Current Every Day Smoker    Packs/day: 1.00    Types: Cigarettes   Smokeless tobacco: Never Used  Vaping Use   Vaping Use: Never used  Substance and Sexual Activity   Alcohol use: Not on file    Comment: Drinks approximately two times per week.   Drug use: Never   Sexual activity: Not on file  Other Topics Concern   Not on file  Social History Narrative   Not on file   Social Determinants of Health   Financial Resource Strain:    Difficulty of Paying Living  Expenses:   Food Insecurity:    Worried About [place] in the Last Year:    Arboriculturist in the Last Year:   Transportation Needs:    Film/video editor (Medical):    Lack of Transportation (Non-Medical):   Physical Activity:    Days of Exercise per Week:    Minutes of Exercise per Session:   Stress:    Feeling of Stress :   Social Connections:    Frequency of Communication with Friends and Family:    Frequency of Social Gatherings with Friends and Family:    Attends Religious Services:    Active Member of Clubs or Organizations:    Attends Music therapist:    Marital Status:   Intimate Partner Violence:    Fear of Current or Ex-Partner:    Emotionally Abused:    Physically Abused:    Sexually Abused:      Current Outpatient Medications:    albuterol (VENTOLIN HFA) 108 (90 Base) MCG/ACT inhaler, Inhale 2 puffs into the lungs every 6 (six) hours as needed for wheezing or shortness of breath., Disp: 8 g, Rfl: 2   ALPRAZolam (XANAX) 0.5 MG tablet, TAKE ONE TABLET BY MOUTH 3 TIMES DAILY, Disp: 90 tablet,  Rfl: 3   Ascorbic Acid (VITAMIN C PO), Take by mouth., Disp: , Rfl:    aspirin 81 MG chewable tablet, Chew 81 mg by mouth daily., Disp: , Rfl:    atenolol (TENORMIN) 25 MG tablet, TAKE 2 TABLETS BY MOUTH EVERY DAY *MUST HAVE APPOINTMENT*, Disp: 60 tablet, Rfl: 2   benzonatate (TESSALON) 200 MG capsule, Take 1 capsule (200 mg total) by mouth 2 (two) times daily as needed for cough., Disp: 30 capsule, Rfl: 1   budesonide-formoterol (SYMBICORT) 160-4.5 MCG/ACT inhaler, Inhale 2 puffs into the lungs 2 (two) times daily., Disp: 1 Inhaler, Rfl: 3   Cyanocobalamin (VITAMIN B-12 PO), Take by mouth., Disp: , Rfl:    ezetimibe (ZETIA) 10 MG tablet, TAKE ONE TABLET BY MOUTH DAILY, Disp: 30 tablet, Rfl: 5   ibuprofen (ADVIL) 800 MG tablet, Take 800 mg by mouth every 8 (eight) hours as needed., Disp: , Rfl:    ipratropium-albuterol  (DUONEB) 0.5-2.5 (3) MG/3ML SOLN, 1 vial in neb q 4-6 hours prn, Disp: 360 mL, Rfl: 0   levothyroxine (SYNTHROID) 25 MCG tablet, TAKE ONE TABLET BY MOUTH EVERY DAY, Disp: 30 tablet, Rfl: 2   Omega-3 1000 MG CAPS, Take by mouth., Disp: , Rfl:    omeprazole (PRILOSEC) 20 MG capsule, TAKE ONE CAPSULE BY MOUTH EVERY DAY, Disp: 90 capsule, Rfl: 1   PREMARIN 0.625 MG tablet, Take 0.625 mg by mouth daily., Disp: , Rfl:    VITAMIN E PO, Take by mouth., Disp: , Rfl:    Allergies  Allergen Reactions   Codeine Hives, Itching, Other (See Comments), Rash and Nausea And Vomiting    CONSTITUTIONAL: see HPI E/N/T: Negative for ear pain, nasal congestion and sore throat.  CARDIOVASCULAR: Negative for chest pain, dizziness, palpitations and pedal edema.  RESPIRATORY: see HPI  NEUROLOGICAL: Negative for dizziness and headaches.  PSYCHIATRIC: Negative for sleep disturbance and to question depression screen.  Negative for depression, negative for anhedonia.         Objective:    PHYSICAL EXAM:   VS: BP (!) 118/58 (BP Location: Left Arm, Patient Position: Sitting)    Pulse 73    Temp (!) 97.5 F (36.4 C) (Temporal)    Ht 5\' 4"  (1.626 m)    Wt 127 lb (57.6 kg)    SpO2 98%    BMI 21.80 kg/m   GEN: Well nourished, well developed, in no acute distress   Oropharynx - normal mucosa, palate, and posterior pharynx  Cardiac: RRR; no murmurs, rubs, or gallops,no edema - no significant varicosities Respiratory:  rare exp wheezes noted  Skin: warm and dry, no rash  Neuro:  Alert and Oriented x 3, Strength and sensation are intact - CN II-Xii grossly intact Psych: euthymic mood, appropriate affect and demeanor   Wt Readings from Last 3 Encounters:  05/28/20 127 lb (57.6 kg)  05/08/20 122 lb 12.8 oz (55.7 kg)  04/19/20 125 lb (56.7 kg)    Health Maintenance Due  Topic Date Due   Hepatitis C Screening  Never done   COVID-19 Vaccine (1) Never done   HIV Screening  Never done   PAP  SMEAR-Modifier  Never done    There are no preventive care reminders to display for this patient.        Assessment & Plan:   Problem List Items Addressed This Visit      Respiratory   Bronchitis - Primary    Continue current meds as directed Stop smoking  No orders of the defined types were placed in this encounter.    SARA R Kentley Blyden, [name]

## 2020-05-28 NOTE — Assessment & Plan Note (Signed)
Continue current meds as directed Stop smoking

## 2020-05-29 ENCOUNTER — Encounter: Payer: Self-pay | Admitting: Physician Assistant

## 2020-05-30 ENCOUNTER — Other Ambulatory Visit: Payer: Self-pay | Admitting: Family Medicine

## 2020-07-23 ENCOUNTER — Encounter: Payer: Self-pay | Admitting: Physician Assistant

## 2020-07-23 ENCOUNTER — Other Ambulatory Visit: Payer: Self-pay

## 2020-07-23 ENCOUNTER — Ambulatory Visit: Payer: 59 | Admitting: Physician Assistant

## 2020-07-23 VITALS — BP 134/68 | HR 69 | Temp 97.2°F | Ht 64.0 in | Wt 129.0 lb

## 2020-07-23 DIAGNOSIS — J40 Bronchitis, not specified as acute or chronic: Secondary | ICD-10-CM

## 2020-07-23 DIAGNOSIS — M79604 Pain in right leg: Secondary | ICD-10-CM | POA: Diagnosis not present

## 2020-07-23 HISTORY — DX: Pain in right leg: M79.604

## 2020-07-23 NOTE — Assessment & Plan Note (Signed)
Resolved Continue to use inhalers as directed

## 2020-07-23 NOTE — Assessment & Plan Note (Signed)
Use ibuprofen as needed

## 2020-07-23 NOTE — Progress Notes (Signed)
Acute Office Visit  Subjective:    Patient ID: Lori Wise, female    DOB: 05/28/1961, 59 y.o.   MRN: [id number]  Chief Complaint  Patient presents with   Right leg giving out    Also patient wants lungs rechecked    HPI Patient is in today for right leg pain Pt states that she lost her balance while working in her closet and got stuck behind a dresser - did cause some mild bruising on her upper leg - says overall is feeling better  Pt with recent history of bronchitis - says she still has a slight occasional cough - wanted to recheck lungs because she is getting married this weekend Denies wheezing - did have chest xray done which was normal  Past Medical History:  Diagnosis Date   Chronic obstructive pulmonary disease with (acute) lower respiratory infection ([place])    Essential (primary) hypertension    Migraine without aura, not intractable, without status migrainosus    Mixed hyperlipidemia    Sunburn of second degree     Past Surgical History:  Procedure Laterality Date   ABDOMINAL HYSTERECTOMY     CARDIAC CATHETERIZATION  2000   normal   CHOLECYSTECTOMY     OTHER SURGICAL HISTORY     Ear surgeries x5    Family History  Problem Relation Age of Onset   Suicidality Mother    Lung cancer Father     Social History   Socioeconomic History   Marital status: Legally Separated    Spouse name: Not on file   Number of children: 2   Years of education: Not on file   Highest education level: Not on file  Occupational History   Occupation: [employer] service  Tobacco Use   Smoking status: Current Every Day Smoker    Packs/day: 1.00    Types: Cigarettes   Smokeless tobacco: Never Used  Vaping Use   Vaping Use: Never used  Substance and Sexual Activity   Alcohol use: Not on file    Comment: Drinks approximately two times per week.   Drug use: Never   Sexual activity: Not on file  Other Topics Concern   Not on file  Social History  Narrative   Not on file   Social Determinants of Health   Financial Resource Strain:    Difficulty of Paying Living Expenses:   Food Insecurity:    Worried About [place] in the Last Year:    Arboriculturist in the Last Year:   Transportation Needs:    Film/video editor (Medical):    Lack of Transportation (Non-Medical):   Physical Activity:    Days of Exercise per Week:    Minutes of Exercise per Session:   Stress:    Feeling of Stress :   Social Connections:    Frequency of Communication with Friends and Family:    Frequency of Social Gatherings with Friends and Family:    Attends Religious Services:    Active Member of Clubs or Organizations:    Attends Music therapist:    Marital Status:   Intimate Partner Violence:    Fear of Current or Ex-Partner:    Emotionally Abused:    Physically Abused:    Sexually Abused:      Current Outpatient Medications:    albuterol (VENTOLIN HFA) 108 (90 Base) MCG/ACT inhaler, Inhale 2 puffs into the lungs every 6 (six) hours as needed for wheezing or shortness of  breath., Disp: 8 g, Rfl: 2   ALPRAZolam (XANAX) 0.5 MG tablet, TAKE ONE TABLET BY MOUTH 3 TIMES DAILY, Disp: 90 tablet, Rfl: 1   Ascorbic Acid (VITAMIN C PO), Take by mouth., Disp: , Rfl:    aspirin 81 MG chewable tablet, Chew 81 mg by mouth daily., Disp: , Rfl:    atenolol (TENORMIN) 25 MG tablet, TAKE 2 TABLETS BY MOUTH EVERY DAY *MUST HAVE APPOINTMENT*, Disp: 60 tablet, Rfl: 2   benzonatate (TESSALON) 200 MG capsule, Take 1 capsule (200 mg total) by mouth 2 (two) times daily as needed for cough., Disp: 30 capsule, Rfl: 1   budesonide-formoterol (SYMBICORT) 160-4.5 MCG/ACT inhaler, Inhale 2 puffs into the lungs 2 (two) times daily., Disp: 1 Inhaler, Rfl: 3   Cyanocobalamin (VITAMIN B-12 PO), Take by mouth., Disp: , Rfl:    ezetimibe (ZETIA) 10 MG tablet, TAKE ONE TABLET BY MOUTH DAILY, Disp: 30 tablet, Rfl: 5    ibuprofen (ADVIL) 800 MG tablet, Take 800 mg by mouth every 8 (eight) hours as needed., Disp: , Rfl:    ipratropium-albuterol (DUONEB) 0.5-2.5 (3) MG/3ML SOLN, 1 vial in neb q 4-6 hours prn, Disp: 360 mL, Rfl: 0   levothyroxine (SYNTHROID) 25 MCG tablet, TAKE ONE TABLET BY MOUTH EVERY DAY, Disp: 30 tablet, Rfl: 2   Omega-3 1000 MG CAPS, Take by mouth., Disp: , Rfl:    omeprazole (PRILOSEC) 20 MG capsule, TAKE ONE CAPSULE BY MOUTH EVERY DAY, Disp: 90 capsule, Rfl: 1   PREMARIN 0.625 MG tablet, Take 0.625 mg by mouth daily., Disp: , Rfl:    VITAMIN E PO, Take by mouth., Disp: , Rfl:    Allergies  Allergen Reactions   Codeine Hives, Itching, Other (See Comments), Rash and Nausea And Vomiting    CONSTITUTIONAL: Negative for chills, fatigue, fever, unintentional weight gain and unintentional weight loss.  CARDIOVASCULAR: Negative for chest pain, dizziness, palpitations and pedal edema.  RESPIRATORY: see HPI  MSK: see HPI INTEGUMENTARY: see HPI      Objective:    PHYSICAL EXAM:   VS: BP 134/68 (BP Location: Left Arm, Patient Position: Sitting)    Pulse 69    Temp (!) 97.2 F (36.2 C) (Temporal)    Ht 5\' 4"  (1.626 m)    Wt 129 lb (58.5 kg)    SpO2 99%    BMI 22.14 kg/m   GEN: Well nourished, well developed, in no acute distress   Cardiac: RRR; no murmurs, rubs, or gallops,no edema -  Respiratory:  normal respiratory rate and pattern with no distress - normal breath sounds with no rales, rhonchi, wheezes or rubs MS: small bruises noted on upper right leg Psych: euthymic mood, appropriate affect and demeanor   Wt Readings from Last 3 Encounters:  07/23/20 129 lb (58.5 kg)  05/28/20 127 lb (57.6 kg)  05/08/20 122 lb 12.8 oz (55.7 kg)    Health Maintenance Due  Topic Date Due   Hepatitis C Screening  Never done   COVID-19 Vaccine (1) Never done   HIV Screening  Never done   PAP SMEAR-Modifier  Never done   INFLUENZA VACCINE  07/08/2020    There are no preventive  care reminders to display for this patient.        Assessment & Plan:   Problem List Items Addressed This Visit      Respiratory   Bronchitis - Primary    Resolved Continue to use inhalers as directed        Other  Right leg pain    Use ibuprofen as needed          No orders of the defined types were placed in this encounter.    SARA R Embree Brawley, [name]

## 2020-07-25 ENCOUNTER — Other Ambulatory Visit: Payer: Self-pay | Admitting: Physician Assistant

## 2020-07-25 ENCOUNTER — Other Ambulatory Visit: Payer: Self-pay | Admitting: Family Medicine

## 2020-08-07 ENCOUNTER — Other Ambulatory Visit: Payer: Self-pay | Admitting: Physician Assistant

## 2020-08-24 ENCOUNTER — Other Ambulatory Visit: Payer: Self-pay | Admitting: Physician Assistant

## 2020-08-24 ENCOUNTER — Other Ambulatory Visit: Payer: Self-pay | Admitting: Family Medicine

## 2020-09-26 ENCOUNTER — Other Ambulatory Visit: Payer: Self-pay | Admitting: Physician Assistant

## 2020-09-26 ENCOUNTER — Other Ambulatory Visit: Payer: Self-pay | Admitting: Family Medicine

## 2020-10-22 ENCOUNTER — Ambulatory Visit: Payer: 59 | Admitting: Family Medicine

## 2020-10-22 ENCOUNTER — Ambulatory Visit: Payer: 59 | Admitting: Physician Assistant

## 2020-10-22 ENCOUNTER — Other Ambulatory Visit: Payer: Self-pay

## 2020-10-22 ENCOUNTER — Encounter: Payer: Self-pay | Admitting: Family Medicine

## 2020-10-22 VITALS — BP 128/78 | HR 72 | Temp 98.0°F | Ht 64.0 in | Wt 128.0 lb

## 2020-10-22 DIAGNOSIS — Z23 Encounter for immunization: Secondary | ICD-10-CM

## 2020-10-22 DIAGNOSIS — K219 Gastro-esophageal reflux disease without esophagitis: Secondary | ICD-10-CM

## 2020-10-22 DIAGNOSIS — Z79890 Hormone replacement therapy (postmenopausal): Secondary | ICD-10-CM

## 2020-10-22 DIAGNOSIS — J449 Chronic obstructive pulmonary disease, unspecified: Secondary | ICD-10-CM

## 2020-10-22 DIAGNOSIS — E038 Other specified hypothyroidism: Secondary | ICD-10-CM | POA: Diagnosis not present

## 2020-10-22 DIAGNOSIS — F419 Anxiety disorder, unspecified: Secondary | ICD-10-CM

## 2020-10-22 DIAGNOSIS — E782 Mixed hyperlipidemia: Secondary | ICD-10-CM | POA: Diagnosis not present

## 2020-10-22 HISTORY — DX: Gastro-esophageal reflux disease without esophagitis: K21.9

## 2020-10-22 HISTORY — DX: Hormone replacement therapy: Z79.890

## 2020-10-22 HISTORY — DX: Chronic obstructive pulmonary disease, unspecified: J44.9

## 2020-10-22 MED ORDER — ESTRADIOL 0.5 MG PO TABS: 0.5000 mg | ORAL_TABLET | Freq: Every day | ORAL | 3 refills | Status: DC

## 2020-10-22 NOTE — Patient Instructions (Addendum)
Generalized Anxiety Disorder, Adult Generalized anxiety disorder (GAD) is a mental health disorder. People with this condition constantly worry about everyday events. Unlike normal anxiety, worry related to GAD is not triggered by a specific event. These worries also do not fade or get better with time. GAD interferes with life functions, including relationships, work, and school. GAD can vary from mild to severe. People with severe GAD can have intense waves of anxiety with physical symptoms (panic attacks). What are the causes? The exact cause of GAD is not known. What increases the risk? This condition is more likely to develop in:  Women.  People who have a family history of anxiety disorders.  People who are very shy.  People who experience very stressful life events, such as the death of a loved one.  People who have a very stressful family environment. What are the signs or symptoms? People with GAD often worry excessively about many things in their lives, such as their health and family. They may also be overly concerned about:  Doing well at work.  Being on time.  Natural disasters.  Friendships. Physical symptoms of GAD include:  Fatigue.  Muscle tension or having muscle twitches.  Trembling or feeling shaky.  Being easily startled.  Feeling like your heart is pounding or racing.  Feeling out of breath or like you cannot take a deep breath.  Having trouble falling asleep or staying asleep.  Sweating.  Nausea, diarrhea, or irritable bowel syndrome (IBS).  Headaches.  Trouble concentrating or remembering facts.  Restlessness.  Irritability. How is this diagnosed? Your health care provider can diagnose GAD based on your symptoms and medical history. You will also have a physical exam. The health care provider will ask specific questions about your symptoms, including how severe they are, when they started, and if they come and go. Your health care  provider may ask you about your use of alcohol or drugs, including prescription medicines. Your health care provider may refer you to a mental health specialist for further evaluation. Your health care provider will do a thorough examination and may perform additional tests to rule out other possible causes of your symptoms. To be diagnosed with GAD, a person must have anxiety that:  Is out of his or her control.  Affects several different aspects of his or her life, such as work and relationships.  Causes distress that makes him or her unable to take part in normal activities.  Includes at least three physical symptoms of GAD, such as restlessness, fatigue, trouble concentrating, irritability, muscle tension, or sleep problems. Before your health care provider can confirm a diagnosis of GAD, these symptoms must be present more days than they are not, and they must last for six months or longer. How is this treated? The following therapies are usually used to treat GAD:  Medicine. Antidepressant medicine is usually prescribed for long-term daily control. Antianxiety medicines may be added in severe cases, especially when panic attacks occur.  Talk therapy (psychotherapy). Certain types of talk therapy can be helpful in treating GAD by providing support, education, and guidance. Options include: ? Cognitive behavioral therapy (CBT). People learn coping skills and techniques to ease their anxiety. They learn to identify unrealistic or negative thoughts and behaviors and to replace them with positive ones. ? Acceptance and commitment therapy (ACT). This treatment teaches people how to be mindful as a way to cope with unwanted thoughts and feelings. ? Biofeedback. This process trains you to manage your body's response (  physiological response) through breathing techniques and relaxation methods. You will work with a therapist while machines are used to monitor your physical symptoms.  Stress  management techniques. These include yoga, meditation, and exercise. A mental health specialist can help determine which treatment is best for you. Some people see improvement with one type of therapy. However, other people require a combination of therapies. Follow these instructions at home:  Take over-the-counter and prescription medicines only as told by your health care provider.  Try to maintain a normal routine.  Try to anticipate stressful situations and allow extra time to manage them.  Practice any stress management or self-calming techniques as taught by your health care provider.  Do not punish yourself for setbacks or for not making progress.  Try to recognize your accomplishments, even if they are small.  Keep all follow-up visits as told by your health care provider. This is important. Contact a health care provider if:  Your symptoms do not get better.  Your symptoms get worse.  You have signs of depression, such as: ? A persistently sad, cranky, or irritable mood. ? Loss of enjoyment in activities that used to bring you joy. ? Change in weight or eating. ? Changes in sleeping habits. ? Avoiding friends or family members. ? Loss of energy for normal tasks. ? Feelings of guilt or worthlessness. Get help right away if:  You have serious thoughts about hurting yourself or others. If you ever feel like you may hurt yourself or others, or have thoughts about taking your own life, get help right away. You can go to your nearest emergency department or call:  Your local emergency services (911 in the [place]  A suicide crisis helpline, such as the [facility] at 787-794-3488. This is open 24 hours a day. Summary  Generalized anxiety disorder (GAD) is a mental health disorder that involves worry that is not triggered by a specific event.  People with GAD often worry excessively about many things in their lives, such as their health and  family.  GAD may cause physical symptoms such as restlessness, trouble concentrating, sleep problems, frequent sweating, nausea, diarrhea, headaches, and trembling or muscle twitching.  A mental health specialist can help determine which treatment is best for you. Some people see improvement with one type of therapy. However, other people require a combination of therapies. This information is not intended to replace advice given to you by your health care provider. Make sure you discuss any questions you have with your health care provider. Document Revised: 11/06/2017 Document Reviewed: 10/14/2016 Elsevier Patient Education  2020 [employer].  Menopause and Hormone Replacement Therapy Menopause is a normal time of life when menstrual periods stop completely and the ovaries stop producing the female hormones estrogen and progesterone. This lack of hormones can affect your health and cause undesirable symptoms. Hormone replacement therapy (HRT) can relieve some of those symptoms. What is hormone replacement therapy? HRT is the use of artificial (synthetic) hormones to replace hormones that your body has stopped producing because you have reached menopause. What are my options for HRT?  HRT may consist of the synthetic hormones estrogen and progestin, or it may consist of only estrogen (estrogen-only therapy). You and your health care provider will decide which form of HRT is best for you. If you choose to be on HRT and you have a uterus, estrogen and progestin are usually prescribed. Estrogen-only therapy is used for women who do not have a uterus. Possible options  for taking HRT include:  Pills.  Patches.  Gels.  Sprays.  Vaginal cream.  Vaginal rings.  Vaginal inserts. The amount of hormone(s) that you take and how long you take the hormone(s) varies according to your health. It is important to:  Begin HRT with the lowest possible dosage.  Stop HRT as soon as your health care  provider tells you to stop.  Work with your health care provider so that you feel informed and comfortable with your decisions. What are the benefits of HRT? HRT can reduce the frequency and severity of menopausal symptoms. Benefits of HRT vary according to the kind of symptoms that you have, how severe they are, and your overall health. HRT may help to improve the following symptoms of menopause:  Hot flashes and night sweats. These are sudden feelings of heat that spread over the face and body. The skin may turn red, like a blush. Night sweats are hot flashes that happen while you are sleeping or trying to sleep.  Bone loss (osteoporosis). The body loses calcium more quickly after menopause, causing the bones to become weaker. This can increase the risk for bone breaks (fractures).  Vaginal dryness. The lining of the vagina can become thin and dry, which can cause pain during sex or cause infection, burning, or itching.  Urinary tract infections.  Urinary incontinence. This is the inability to control when you pass urine.  Irritability.  Short-term memory problems. What are the risks of HRT? Risks of HRT vary depending on your individual health and medical history. Risks of HRT also depend on whether you receive both estrogen and progestin or you receive estrogen only. HRT may increase the risk of:  Spotting. This is when a small amount of blood leaks from the vagina unexpectedly.  Endometrial cancer. This cancer is in the lining of the uterus (endometrium).  Breast cancer.  Increased density of breast tissue. This can make it harder to find breast cancer on a breast X-ray (mammogram).  Stroke.  Heart disease.  Blood clots.  Gallbladder disease.  Liver disease. Risks of HRT can increase if you have any of the following conditions:  Endometrial cancer.  Liver disease.  Heart disease.  Breast cancer.  History of blood clots.  History of stroke. Follow these  instructions at home:  Take over-the-counter and prescription medicines only as told by your health care provider.  Get mammograms, pelvic exams, and medical checkups as often as told by your health care provider.  Have Pap tests done as often as told by your health care provider. A Pap test is sometimes called a Pap smear. It is a screening test that is used to check for signs of cancer of the cervix and vagina. A Pap test can also identify the presence of infection or precancerous changes. Pap tests may be done: ? Every 3 years, starting at age 31. ? Every 5 years, starting after age 19, in combination with testing for human papillomavirus (HPV). ? More often or less often depending on other medical conditions you have, your age, and other risk factors.  It is up to you to get the results of your Pap test. Ask your health care provider, or the department that is doing the test, when your results will be ready.  Keep all follow-up visits as told by your health care provider. This is important. Contact a health care provider if you have:  Pain or swelling in your legs.  Shortness of breath.  Chest pain.  Lumps or changes in your breasts or armpits.  Slurred speech.  Pain, burning, or bleeding when you urinate.  Unusual vaginal bleeding.  Dizziness or headaches.  Weakness or numbness in any part of your arms or legs.  Pain in your abdomen. Summary  Menopause is a normal time of life when menstrual periods stop completely and the ovaries stop producing the female hormones estrogen and progesterone.  Hormone replacement therapy (HRT) can relieve some of the symptoms of menopause.  HRT can reduce the frequency and severity of menopausal symptoms.  Risks of HRT vary depending on your individual health and medical history. This information is not intended to replace advice given to you by your health care provider. Make sure you discuss any questions you have with your health  care provider. Document Revised: 07/27/2018 Document Reviewed: 07/27/2018 Elsevier Patient Education  2020 [employer].

## 2020-10-22 NOTE — Progress Notes (Signed)
Established Patient Office Visit  Subjective:  Patient ID: Lori Wise, female    DOB: 1961-09-18  Age: 59 y.o. MRN: [id number]  CC:  Chief Complaint  Patient presents with  . Hypothyroidism    69M follow up    HPI [name] presents for COPD/HTN/Migraine/Hyperlipidemia/HTN COPD-sat-use inhalers once a week-trigger-smoking/cold weather/nasal drainage-last time 3 nights ago-duo-neb HTN-checks bp at home 123/87-elevated with anxiety Hyperlipidemia-zetia/diet modification/omega 3 Anxiety-brother with lung cancer-pt helps with doctor and medication-pt uses Xanax prn for anxiety treatment GERD-omeprazole-no longer taking daily, no gallbladder-irritable bowel symptoms HRT-premarin 0.625-pt takes daily-trial of every other day caused worsening symptoms-Wise a new HRT-pt feels premarin is to expensive and wants generic due to cost. Pt continues to smoke knowing risk of HRT + tob use Hysterectomy-cyst behind ovaries-total hysterectomy-89 Past Medical History:  Diagnosis Date  . Chronic obstructive pulmonary disease with (acute) lower respiratory infection ([place])   . Essential (primary) hypertension   . Migraine without aura, not intractable, without status migrainosus   . Mixed hyperlipidemia   . Sunburn of second degree     Past Surgical History:  Procedure Laterality Date  . ABDOMINAL HYSTERECTOMY    . CARDIAC CATHETERIZATION  2000   normal  . CHOLECYSTECTOMY    . OTHER SURGICAL HISTORY     Ear surgeries x5    Family History  Problem Relation Age of Onset  . Suicidality Mother   . Lung cancer Father     Social History   Socioeconomic History  . Marital status: Legally Separated    Spouse name: Not on file  . Number of children: 2  . Years of education: Not on file  . Highest education level: Not on file  Occupational History  . Occupation: [employer] service  Tobacco Use  . Smoking status: Current Every Day Smoker    Packs/day: 1.00    Types: Cigarettes  .  Smokeless tobacco: Never Used  Vaping Use  . Vaping Use: Never used  Substance and Sexual Activity  . Alcohol use: Not on file    Comment: Drinks approximately two times per week.  . Drug use: Never  . Sexual activity: Not on file  Other Topics Concern  . Not on file  Social History Narrative  . Not on file   Social Determinants of Health   Financial Resource Strain:   . Difficulty of Paying Living Expenses: Not on file  Food Insecurity:   . Worried About Charity fundraiser in the Last Year: Not on file  . Ran Out of Food in the Last Year: Not on file  Transportation Wise:   . Lack of Transportation (Medical): Not on file  . Lack of Transportation (Non-Medical): Not on file  Physical Activity:   . Days of Exercise per Week: Not on file  . Minutes of Exercise per Session: Not on file  Stress:   . Feeling of Stress : Not on file  Social Connections:   . Frequency of Communication with Friends and Family: Not on file  . Frequency of Social Gatherings with Friends and Family: Not on file  . Attends Religious Services: Not on file  . Active Member of Clubs or Organizations: Not on file  . Attends Archivist Meetings: Not on file  . Marital Status: Not on file  Intimate Partner Violence:   . Fear of Current or Ex-Partner: Not on file  . Emotionally Abused: Not on file  . Physically Abused: Not on file  .  Sexually Abused: Not on file    Outpatient Medications Prior to Visit  Medication Sig Dispense Refill  . albuterol (VENTOLIN HFA) 108 (90 Base) MCG/ACT inhaler Inhale 2 puffs into the lungs every 6 (six) hours as needed for wheezing or shortness of breath. 8 g 2  . ALPRAZolam (XANAX) 0.5 MG tablet TAKE ONE TABLET BY MOUTH 3 TIMES DAILY 90 tablet 1  . Ascorbic Acid (VITAMIN C PO) Take by mouth.    Marland Kitchen aspirin 81 MG chewable tablet Chew 81 mg by mouth daily.    Marland Kitchen atenolol (TENORMIN) 25 MG tablet TAKE 2 TABLETS BY MOUTH EVERY DAY *MUST HAVE APPOINTMENT* 60 tablet 2    . benzonatate (TESSALON) 200 MG capsule Take 1 capsule (200 mg total) by mouth 2 (two) times daily as needed for cough. 30 capsule 1  . budesonide-formoterol (SYMBICORT) 160-4.5 MCG/ACT inhaler Inhale 2 puffs into the lungs 2 (two) times daily. 10.2 g 3  . Cyanocobalamin (VITAMIN B-12 PO) Take by mouth.    . ezetimibe (ZETIA) 10 MG tablet TAKE ONE TABLET BY MOUTH DAILY 90 tablet 0  . ibuprofen (ADVIL) 800 MG tablet TAKE ONE TABLET BY MOUTH EVERY 8 HOURS AS NEEDED FOR PAIN 60 tablet 2  . ipratropium-albuterol (DUONEB) 0.5-2.5 (3) MG/3ML SOLN 1 vial in neb q 4-6 hours prn 360 mL 0  . levothyroxine (SYNTHROID) 25 MCG tablet TAKE ONE TABLET BY MOUTH EVERY DAY 30 tablet 2  . Omega-3 1000 MG CAPS Take by mouth.    Marland Kitchen omeprazole (PRILOSEC) 20 MG capsule TAKE ONE CAPSULE BY MOUTH EVERY DAY 90 capsule 0  . PREMARIN 0.625 MG tablet Take 0.625 mg by mouth daily.    . Vitamin D, Ergocalciferol, (DRISDOL) 1.25 MG (50000 UNIT) CAPS capsule Take 50,000 Units by mouth every 7 (seven) days.    Marland Kitchen VITAMIN E PO Take by mouth.     No facility-administered medications prior to visit.    Allergies  Allergen Reactions  . Codeine Hives, Itching, Other (See Comments), Rash and Nausea And Vomiting    ROS Review of Systems  Constitutional: Positive for fatigue. Negative for fever.  HENT: Positive for congestion.   Respiratory: Positive for cough and shortness of breath.   Cardiovascular: Negative.   Gastrointestinal: Positive for diarrhea.       GERD  Skin: Negative.   Allergic/Immunologic: Positive for environmental allergies.  Neurological: Positive for headaches.  Psychiatric/Behavioral: The patient is nervous/anxious.       Objective:    Physical Exam Constitutional:      Appearance: Normal appearance.  HENT:     Head: Normocephalic and atraumatic.  Eyes:     Conjunctiva/sclera: Conjunctivae normal.  Cardiovascular:     Rate and Rhythm: Normal rate.     Pulses: Normal pulses.     Heart  sounds: Normal heart sounds.  Pulmonary:     Effort: Pulmonary effort is normal.     Breath sounds: Normal breath sounds.  Musculoskeletal:        General: Normal range of motion.     Cervical back: Normal range of motion and neck supple.  Neurological:     Mental Status: She is alert and oriented to person, place, and time.  Psychiatric:        Mood and Affect: Mood normal.        Behavior: Behavior normal.     BP 128/78 (BP Location: Right Arm, Patient Position: Sitting, Cuff Size: Normal)   Pulse 72   Temp 98 F (  36.7 C) (Temporal)   Ht '5\' 4"'  (1.626 m)   Wt 128 lb (58.1 kg)   SpO2 99%   BMI 21.97 kg/m  Wt Readings from Last 3 Encounters:  10/22/20 128 lb (58.1 kg)  07/23/20 129 lb (58.5 kg)  05/28/20 127 lb (57.6 kg)     Health Maintenance Due  Topic Date Due  . Hepatitis C Screening  Never done  . HIV Screening  Never done  . PAP SMEAR-Modifier  Never done     Lab Results  Component Value Date   TSH 1.470 04/19/2020   Lab Results  Component Value Date   WBC 10.0 04/19/2020   HGB 13.3 04/19/2020   HCT 39.0 04/19/2020   MCV 91 04/19/2020   PLT 340 04/19/2020   Lab Results  Component Value Date   NA 140 04/19/2020   K 4.2 04/19/2020   CO2 23 04/19/2020   GLUCOSE 89 04/19/2020   BUN 10 04/19/2020   CREATININE 0.69 04/19/2020   BILITOT 0.3 04/19/2020   ALKPHOS 74 04/19/2020   AST 13 04/19/2020   ALT 7 04/19/2020   PROT 6.4 04/19/2020   ALBUMIN 4.4 04/19/2020   CALCIUM 9.6 04/19/2020   Lab Results  Component Value Date   CHOL 216 (H) 04/19/2020   Lab Results  Component Value Date   HDL 60 04/19/2020   Lab Results  Component Value Date   LDLCALC 129 (H) 04/19/2020   Lab Results  Component Value Date   TRIG 151 (H) 04/19/2020   Lab Results  Component Value Date   CHOLHDL 3.6 04/19/2020     Assessment & Plan:   1. Encounter for immunization - Flu Vaccine MDCK QUAD PF  2. Mixed hyperlipidemia - Lipid Profile - CMP14+EGFR - CBC  with Differential/Platelet zetia daily-stable 3. Other specified hypothyroidism - T4 AND TSH Levothyroxine 98mg daily 4. Anxiety - CBC with Differential/Platelet Xanax prn-pt understand addictive potential  5. Chronic obstructive pulmonary disease, unspecified COPD type (HCC) symbicort-stable Albuterol prn Pt unable to quit smoking  6. Hormone replacement therapy (HRT) Premarin -pt request change to generic due to cost-d/w risk of HRT and concerns about generic vs brand. Pt understands change in medication may have side effects and differences in effectiveness for menopausal symptoms. Pt and I discussed concerns about HRT and tobacco use-encouraged pt to quit smoking. Estrace low dose -rx daily-pt understands risk/benefit of treatment. Pt tried to wean dose to every other day HRT unsuccessfully. 6/21 mammogram report reviewed-pt understands risk of breast CA and HRT-yearly mammograms 7. Gastroesophageal reflux disease, unspecified whether esophagitis present Omeprazole daily Follow-up: 3 months-COPD/HRT-recent change to Estrace-low dose daily instead of Premarin qod,    Nikoletta Varma LHannah Beat MD

## 2020-10-23 ENCOUNTER — Other Ambulatory Visit: Payer: Self-pay | Admitting: Family Medicine

## 2020-10-23 LAB — LIPID PANEL
Chol/HDL Ratio: 3.8 ratio (ref 0.0–4.4)
Cholesterol, Total: 227 mg/dL — ABNORMAL HIGH (ref 100–199)
HDL: 60 mg/dL (ref >39)
LDL Chol Calc (NIH): 143 mg/dL — ABNORMAL HIGH (ref 0–99)
Triglycerides: 136 mg/dL (ref 0–149)
VLDL Cholesterol Cal: 24 mg/dL (ref 5–40)

## 2020-10-23 LAB — CMP14+EGFR
ALT: 9 IU/L (ref 0–32)
AST: 15 IU/L (ref 0–40)
Albumin/Globulin Ratio: 2.2 (ref 1.2–2.2)
Albumin: 4.3 g/dL (ref 3.8–4.9)
Alkaline Phosphatase: 76 IU/L (ref 44–121)
BUN/Creatinine Ratio: 20 (ref 12–28)
BUN: 12 mg/dL (ref 8–27)
Bilirubin Total: 0.3 mg/dL (ref 0.0–1.2)
CO2: 18 mmol/L — ABNORMAL LOW (ref 20–29)
Calcium: 9.5 mg/dL (ref 8.7–10.3)
Chloride: 106 mmol/L (ref 96–106)
Creatinine, Ser: 0.61 mg/dL (ref 0.57–1.00)
GFR calc Af Amer: 114 mL/min/{1.73_m2} (ref >59)
GFR calc non Af Amer: 99 mL/min/{1.73_m2} (ref >59)
Globulin, Total: 2 g/dL (ref 1.5–4.5)
Glucose: 89 mg/dL (ref 65–99)
Potassium: 4.2 mmol/L (ref 3.5–5.2)
Sodium: 140 mmol/L (ref 134–144)
Total Protein: 6.3 g/dL (ref 6.0–8.5)

## 2020-10-23 LAB — CBC WITH DIFFERENTIAL/PLATELET
Basophils Absolute: 0.1 10*3/uL (ref 0.0–0.2)
Basos: 1 %
EOS (ABSOLUTE): 0.2 10*3/uL (ref 0.0–0.4)
Eos: 2 %
Hematocrit: 39.4 % (ref 34.0–46.6)
Hemoglobin: 13.5 g/dL (ref 11.1–15.9)
Immature Grans (Abs): 0 10*3/uL (ref 0.0–0.1)
Immature Granulocytes: 0 %
Lymphocytes Absolute: 4.4 10*3/uL — ABNORMAL HIGH (ref 0.7–3.1)
Lymphs: 39 %
MCH: 31.4 pg (ref 26.6–33.0)
MCHC: 34.3 g/dL (ref 31.5–35.7)
MCV: 92 fL (ref 79–97)
Monocytes Absolute: 0.7 10*3/uL (ref 0.1–0.9)
Monocytes: 6 %
Neutrophils Absolute: 5.8 10*3/uL (ref 1.4–7.0)
Neutrophils: 52 %
Platelets: 341 10*3/uL (ref 150–450)
RBC: 4.3 x10E6/uL (ref 3.77–5.28)
RDW: 12.9 % (ref 11.7–15.4)
WBC: 11.3 10*3/uL — ABNORMAL HIGH (ref 3.4–10.8)

## 2020-10-23 LAB — T4 AND TSH
T4, Total: 8.8 ug/dL (ref 4.5–12.0)
TSH: 1.88 u[IU]/mL (ref 0.450–4.500)

## 2020-10-23 LAB — CARDIOVASCULAR RISK ASSESSMENT

## 2020-10-23 NOTE — Progress Notes (Unsigned)
cxr Cough-elevated WBC

## 2020-10-25 ENCOUNTER — Other Ambulatory Visit: Payer: Self-pay | Admitting: Physician Assistant

## 2020-11-21 ENCOUNTER — Other Ambulatory Visit: Payer: Self-pay | Admitting: Physician Assistant

## 2020-11-21 ENCOUNTER — Other Ambulatory Visit: Payer: Self-pay | Admitting: Family Medicine

## 2020-12-02 ENCOUNTER — Other Ambulatory Visit: Payer: Self-pay | Admitting: Physician Assistant

## 2020-12-04 ENCOUNTER — Other Ambulatory Visit: Payer: Self-pay | Admitting: Physician Assistant

## 2020-12-04 MED ORDER — IBUPROFEN 800 MG PO TABS: 800.0000 mg | ORAL_TABLET | Freq: Three times a day (TID) | ORAL | 2 refills | Status: DC | PRN

## 2020-12-05 ENCOUNTER — Other Ambulatory Visit: Payer: Self-pay | Admitting: Physician Assistant

## 2020-12-26 ENCOUNTER — Other Ambulatory Visit: Payer: Self-pay | Admitting: Family Medicine

## 2021-01-03 ENCOUNTER — Telehealth (INDEPENDENT_AMBULATORY_CARE_PROVIDER_SITE_OTHER): Payer: 59 | Admitting: Physician Assistant

## 2021-01-03 ENCOUNTER — Encounter: Payer: Self-pay | Admitting: Physician Assistant

## 2021-01-03 VITALS — BP 120/68 | HR 91 | Temp 98.9°F | Ht 65.0 in | Wt 128.0 lb

## 2021-01-03 DIAGNOSIS — J060 Acute laryngopharyngitis: Secondary | ICD-10-CM

## 2021-01-03 DIAGNOSIS — R509 Fever, unspecified: Secondary | ICD-10-CM

## 2021-01-03 DIAGNOSIS — R5381 Other malaise: Secondary | ICD-10-CM

## 2021-01-03 LAB — POC COVID19 BINAXNOW: SARS Coronavirus 2 Ag: POSITIVE — AB

## 2021-01-03 MED ORDER — PREDNISONE 20 MG PO TABS: ORAL_TABLET | ORAL | 0 refills | Status: AC

## 2021-01-03 MED ORDER — BENZONATATE 200 MG PO CAPS: 200.0000 mg | ORAL_CAPSULE | Freq: Two times a day (BID) | ORAL | 1 refills | Status: DC | PRN

## 2021-01-03 MED ORDER — AMOXICILLIN-POT CLAVULANATE 875-125 MG PO TABS: 1.0000 | ORAL_TABLET | Freq: Two times a day (BID) | ORAL | 0 refills | Status: DC

## 2021-01-03 NOTE — Progress Notes (Signed)
Virtual Visit via Telephone Note   This visit type was conducted due to national recommendations for restrictions regarding the COVID-19 Pandemic (e.g. social distancing) in an effort to limit this patient's exposure and mitigate transmission in our community.  Due to her co-morbid illnesses, this patient is at least at moderate risk for complications without adequate follow up.  This format is felt to be most appropriate for this patient at this time.  The patient did not have access to video technology/had technical difficulties with video requiring transitioning to audio format only (telephone).  All issues noted in this document were discussed and addressed.  No physical exam could be performed with this format.  Patient verbally consented to a telehealth visit.   Date:  01/03/2021   ID:  Lori Wise, DOB May 24, 1961, MRN [id number]  Patient Location: Home  Chief Complaint: cpugh/uri Provider Location: Office  PCP:  Marianne Sofia, [name]    History of Present Illness:    Lori Wise is a 60 y.o. female with complaints of fever up to 101, cough, wheezing, headache and malaise - states she is using her inhalers as directed - has had COVID exposure and daughter with same symptoms  The patient does have symptoms concerning for COVID-19 infection (fever, chills, cough, or new shortness of breath).    Past Medical History:  Diagnosis Date  . Chronic obstructive pulmonary disease with (acute) lower respiratory infection ([facility])   . Essential (primary) hypertension   . Migraine without aura, not intractable, without status migrainosus   . Mixed hyperlipidemia   . Sunburn of second degree    Past Surgical History:  Procedure Laterality Date  . ABDOMINAL HYSTERECTOMY    . CARDIAC CATHETERIZATION  2000   normal  . CHOLECYSTECTOMY    . OTHER SURGICAL HISTORY     Ear surgeries x5     Current Meds  Medication Sig  . albuterol (VENTOLIN HFA) 108 (90 Base) MCG/ACT inhaler Inhale 2 puffs  into the lungs every 6 (six) hours as needed for wheezing or shortness of breath.  . ALPRAZolam (XANAX) 0.5 MG tablet TAKE ONE TABLET BY MOUTH 3 TIMES DAILY  . amoxicillin-clavulanate (AUGMENTIN) 875-125 MG tablet Take 1 tablet by mouth 2 (two) times daily.  . Ascorbic Acid (VITAMIN C PO) Take by mouth.  Marland Kitchen aspirin 81 MG chewable tablet Chew 81 mg by mouth daily.  Marland Kitchen atenolol (TENORMIN) 25 MG tablet TAKE 2 TABLETS BY MOUTH EVERY DAY *MUST HAVE APPOINTMENT*  . benzonatate (TESSALON) 200 MG capsule Take 1 capsule (200 mg total) by mouth 2 (two) times daily as needed for cough.  . budesonide-formoterol (SYMBICORT) 160-4.5 MCG/ACT inhaler Inhale 2 puffs into the lungs 2 (two) times daily.  . Cyanocobalamin (VITAMIN B-12 PO) Take by mouth.  . estradiol (ESTRACE) 0.5 MG tablet Take 1 tablet (0.5 mg total) by mouth daily.  Marland Kitchen ezetimibe (ZETIA) 10 MG tablet TAKE ONE TABLET BY MOUTH DAILY  . ibuprofen (ADVIL) 800 MG tablet TAKE ONE TABLET BY MOUTH EVERY 8 HOURS AS NEEDED FOR PAIN  . ipratropium-albuterol (DUONEB) 0.5-2.5 (3) MG/3ML SOLN 1 vial in neb q 4-6 hours prn  . levothyroxine (SYNTHROID) 25 MCG tablet TAKE ONE TABLET BY MOUTH EVERY DAY  . Omega-3 1000 MG CAPS Take by mouth.  Marland Kitchen omeprazole (PRILOSEC) 20 MG capsule TAKE ONE CAPSULE BY MOUTH EVERY DAY  . predniSONE (DELTASONE) 20 MG tablet Take 3 tablets (60 mg total) by mouth daily with breakfast for 3 days, THEN 2 tablets (  40 mg total) daily with breakfast for 3 days, THEN 1 tablet (20 mg total) daily with breakfast for 3 days.  . Vitamin D, Ergocalciferol, (DRISDOL) 1.25 MG (50000 UNIT) CAPS capsule Take 50,000 Units by mouth every 7 (seven) days.  Marland Kitchen VITAMIN E PO Take by mouth.  . [DISCONTINUED] benzonatate (TESSALON) 200 MG capsule Take 1 capsule (200 mg total) by mouth 2 (two) times daily as needed for cough.     Allergies:   Codeine   Social History   Tobacco Use  . Smoking status: Current Every Day Smoker    Packs/day: 1.00    Types:  Cigarettes  . Smokeless tobacco: Never Used  Vaping Use  . Vaping Use: Never used  Substance Use Topics  . Alcohol use: Not Currently    Comment: Drinks approximately two times per week.  . Drug use: Never     Family Hx: The patient's family history includes Lung cancer in her father; Suicidality in her mother.  ROS:   Please see the history of present illness.    All other systems reviewed and are negative.  Labs/Other Tests and Data Reviewed:    Recent Labs: 10/22/2020: ALT 9; BUN 12; Creatinine, Ser 0.61; Hemoglobin 13.5; Platelets 341; Potassium 4.2; Sodium 140; TSH 1.880   Recent Lipid Panel Lab Results  Component Value Date/Time   CHOL 227 (H) 10/22/2020 10:14 AM   TRIG 136 10/22/2020 10:14 AM   HDL 60 10/22/2020 10:14 AM   CHOLHDL 3.8 10/22/2020 10:14 AM   LDLCALC 143 (H) 10/22/2020 10:14 AM    Wt Readings from Last 3 Encounters:  01/03/21 128 lb (58.1 kg)  10/22/20 128 lb (58.1 kg)  07/23/20 129 lb (58.5 kg)     Objective:    Vital Signs:  BP 120/68   Pulse 91   Temp 98.9 F (37.2 C)   Ht 5\' 5"  (1.651 m)   Wt 128 lb (58.1 kg)   SpO2 98%   BMI 21.30 kg/m    VITAL SIGNS:  reviewed Video Visit on 01/03/2021  Component Date Value Ref Range Status  . SARS Coronavirus 2 Ag 01/03/2021 Positive* Negative Final   patient aware    ASSESSMENT & PLAN:   1. COVID 19 - recommend rest, fluids, and quarantine per CDC guideline 2. URI/cough - rx for augmentin, prednisone and tessalon  COVID-19 Education: The signs and symptoms of COVID-19 were discussed with the patient and how to seek care for testing (follow up with PCP or arrange E-visit). The importance of social distancing was discussed today.  Time:   Today, I have spent 10 minutes with the patient with telehealth technology discussing the above problems.     Medication Adjustments/Labs and Tests Ordered: Current medicines are reviewed at length with the patient today.  Concerns regarding medicines  are outlined above.   Tests Ordered: Orders Placed This Encounter  Procedures  . POC COVID-19 BinaxNow    Medication Changes: Meds ordered this encounter  Medications  . predniSONE (DELTASONE) 20 MG tablet    Sig: Take 3 tablets (60 mg total) by mouth daily with breakfast for 3 days, THEN 2 tablets (40 mg total) daily with breakfast for 3 days, THEN 1 tablet (20 mg total) daily with breakfast for 3 days.    Dispense:  18 tablet    Refill:  0    Order Specific Question:   Supervising Provider    AnswerRochel Brome [id number]  . amoxicillin-clavulanate (AUGMENTIN) 875-125 MG tablet  Sig: Take 1 tablet by mouth 2 (two) times daily.    Dispense:  20 tablet    Refill:  0    Order Specific Question:   Supervising Provider    AnswerRochel Brome [id number]  . benzonatate (TESSALON) 200 MG capsule    Sig: Take 1 capsule (200 mg total) by mouth 2 (two) times daily as needed for cough.    Dispense:  30 capsule    Refill:  1    Order Specific Question:   Supervising Provider    AnswerShelton Silvas    Follow Up:  In Person prn  Signed, [name], [name]  01/03/2021 2:14 PM    [place]

## 2021-01-23 ENCOUNTER — Other Ambulatory Visit: Payer: Self-pay | Admitting: Physician Assistant

## 2021-01-23 ENCOUNTER — Other Ambulatory Visit: Payer: Self-pay | Admitting: Family Medicine

## 2021-01-23 NOTE — Telephone Encounter (Signed)
Yours

## 2021-02-01 ENCOUNTER — Ambulatory Visit (INDEPENDENT_AMBULATORY_CARE_PROVIDER_SITE_OTHER): Payer: 59 | Admitting: Physician Assistant

## 2021-02-01 ENCOUNTER — Encounter: Payer: Self-pay | Admitting: Physician Assistant

## 2021-02-01 ENCOUNTER — Other Ambulatory Visit: Payer: Self-pay

## 2021-02-01 VITALS — BP 112/62 | HR 78 | Temp 97.5°F | Ht 64.0 in | Wt 129.0 lb

## 2021-02-01 DIAGNOSIS — J3089 Other allergic rhinitis: Secondary | ICD-10-CM | POA: Diagnosis not present

## 2021-02-01 DIAGNOSIS — Z79890 Hormone replacement therapy (postmenopausal): Secondary | ICD-10-CM | POA: Diagnosis not present

## 2021-02-01 DIAGNOSIS — E782 Mixed hyperlipidemia: Secondary | ICD-10-CM

## 2021-02-01 DIAGNOSIS — J449 Chronic obstructive pulmonary disease, unspecified: Secondary | ICD-10-CM | POA: Diagnosis not present

## 2021-02-01 DIAGNOSIS — E559 Vitamin D deficiency, unspecified: Secondary | ICD-10-CM

## 2021-02-01 DIAGNOSIS — E038 Other specified hypothyroidism: Secondary | ICD-10-CM

## 2021-02-01 MED ORDER — ESTRADIOL 0.5 MG PO TABS: 0.5000 mg | ORAL_TABLET | Freq: Every day | ORAL | 1 refills | Status: DC

## 2021-02-01 MED ORDER — CETIRIZINE HCL 10 MG PO TABS: 10.0000 mg | ORAL_TABLET | Freq: Every day | ORAL | 11 refills | Status: DC

## 2021-02-01 MED ORDER — EZETIMIBE 10 MG PO TABS: 10.0000 mg | ORAL_TABLET | Freq: Every day | ORAL | 1 refills | Status: DC

## 2021-02-01 NOTE — Progress Notes (Signed)
Established Patient Office Visit  Subjective:  Patient ID: Lori Wise, female    DOB: January 11, 1961  Age: 60 y.o. MRN: [id number]  CC:  Chief Complaint  Patient presents with  . Hyperlipidemia    HPI Lori Wise presents for follow up hypertension  Pt presents for follow up of hypertension. . The patient is tolerating the medication well without side effects. Compliance with treatment has been good; including taking medication as directed , maintains a healthy diet and regular exercise regimen , and following up as directed. Is currently on tenormin 25mg  qd  Mixed hyperlipidemia  Pt presents with hyperlipidemia.  Compliance with treatment has been good The patient is compliant with medications, maintains a low cholesterol diet , follows up as directed , and maintains an exercise regimen . The patient denies experiencing any hypercholesterolemia related symptoms. Currently on fish oil and zetia  Pt with history of anxiety - doing well on xanax as needed   Pt with history of asthma - symptoms stable at this time - currently uses ventolin and duoneb - would also like rx for zyrtec for her allergies  Pt with history of hypothyroidism - currently on synthroid 21mcg qd    Past Medical History:  Diagnosis Date  . Chronic obstructive pulmonary disease with (acute) lower respiratory infection ([place])   . Essential (primary) hypertension   . Migraine without aura, not intractable, without status migrainosus   . Mixed hyperlipidemia   . Sunburn of second degree     Past Surgical History:  Procedure Laterality Date  . ABDOMINAL HYSTERECTOMY    . CARDIAC CATHETERIZATION  2000   normal  . CHOLECYSTECTOMY    . OTHER SURGICAL HISTORY     Ear surgeries x5    Family History  Problem Relation Age of Onset  . Suicidality Mother   . Lung cancer Father     Social History   Socioeconomic History  . Marital status: Legally Separated    Spouse name: Not on file  . Number of children: 2   . Years of education: Not on file  . Highest education level: Not on file  Occupational History  . Occupation: [employer] service  Tobacco Use  . Smoking status: Current Every Day Smoker    Packs/day: 1.00    Types: Cigarettes  . Smokeless tobacco: Never Used  Vaping Use  . Vaping Use: Never used  Substance and Sexual Activity  . Alcohol use: Not Currently    Comment: Drinks approximately two times per week.  . Drug use: Never  . Sexual activity: Not on file  Other Topics Concern  . Not on file  Social History Narrative  . Not on file   Social Determinants of Health   Financial Resource Strain: Not on file  Food Insecurity: Not on file  Transportation Needs: Not on file  Physical Activity: Not on file  Stress: Not on file  Social Connections: Not on file  Intimate Partner Violence: Not on file     Current Outpatient Medications:  .  albuterol (VENTOLIN HFA) 108 (90 Base) MCG/ACT inhaler, Inhale 2 puffs into the lungs every 6 (six) hours as needed for wheezing or shortness of breath., Disp: 8 g, Rfl: 2 .  ALPRAZolam (XANAX) 0.5 MG tablet, TAKE ONE TABLET BY MOUTH 3 TIMES DAILY, Disp: 90 tablet, Rfl: 1 .  Ascorbic Acid (VITAMIN C PO), Take by mouth., Disp: , Rfl:  .  aspirin 81 MG chewable tablet, Chew 81 mg by mouth  daily., Disp: , Rfl:  .  atenolol (TENORMIN) 25 MG tablet, TAKE 2 TABLETS BY MOUTH EVERY DAY *MUST HAVE APPOINTMENT*, Disp: 60 tablet, Rfl: 3 .  cetirizine (ZYRTEC) 10 MG tablet, Take 1 tablet (10 mg total) by mouth daily., Disp: 30 tablet, Rfl: 11 .  Cyanocobalamin (VITAMIN B-12 PO), Take by mouth., Disp: , Rfl:  .  ibuprofen (ADVIL) 800 MG tablet, TAKE ONE TABLET BY MOUTH EVERY 8 HOURS AS NEEDED FOR PAIN, Disp: 60 tablet, Rfl: 2 .  ipratropium-albuterol (DUONEB) 0.5-2.5 (3) MG/3ML SOLN, 1 vial in neb q 4-6 hours prn, Disp: 360 mL, Rfl: 0 .  levothyroxine (SYNTHROID) 25 MCG tablet, TAKE ONE TABLET BY MOUTH EVERY DAY, Disp: 30 tablet, Rfl: 2 .  Omega-3 1000  MG CAPS, Take by mouth., Disp: , Rfl:  .  omeprazole (PRILOSEC) 20 MG capsule, TAKE ONE CAPSULE BY MOUTH EVERY DAY, Disp: 30 capsule, Rfl: 1 .  SYMBICORT 160-4.5 MCG/ACT inhaler, Inhale 2 puffs into the lungs 2 (two) times daily., Disp: 10.2 g, Rfl: 3 .  Vitamin D, Ergocalciferol, (DRISDOL) 1.25 MG (50000 UNIT) CAPS capsule, Take 50,000 Units by mouth every 7 (seven) days., Disp: , Rfl:  .  VITAMIN E PO, Take by mouth., Disp: , Rfl:  .  estradiol (ESTRACE) 0.5 MG tablet, Take 1 tablet (0.5 mg total) by mouth daily., Disp: 90 tablet, Rfl: 1 .  ezetimibe (ZETIA) 10 MG tablet, Take 1 tablet (10 mg total) by mouth daily., Disp: 90 tablet, Rfl: 1   Allergies  Allergen Reactions  . Codeine Hives, Itching, Other (See Comments), Rash and Nausea And Vomiting    ROS CONSTITUTIONAL: Negative for chills, fatigue, fever, unintentional weight gain and unintentional weight loss.  E/N/T: Negative for ear pain, nasal congestion and sore throat.  CARDIOVASCULAR: Negative for chest pain, dizziness, palpitations and pedal edema.  RESPIRATORY: Negative for recent cough and dyspnea.  GASTROINTESTINAL: Negative for abdominal pain, acid reflux symptoms, constipation, diarrhea, nausea and vomiting.  MSK: Negative for arthralgias and myalgias.  INTEGUMENTARY: Negative for rash.  NEUROLOGICAL: Negative for dizziness and headaches.  PSYCHIATRIC: Negative for sleep disturbance and to question depression screen.  Negative for depression, negative for anhedonia.            Objective:    PHYSICAL EXAM:   VS: BP 112/62 (BP Location: Left Arm, Patient Position: Sitting, Cuff Size: Normal)   Pulse 78   Temp (!) 97.5 F (36.4 C) (Temporal)   Ht 5\' 4"  (1.626 m)   Wt 129 lb (58.5 kg)   SpO2 94%   BMI 22.14 kg/m   PHYSICAL EXAM:   VS: BP 112/62 (BP Location: Left Arm, Patient Position: Sitting, Cuff Size: Normal)   Pulse 78   Temp (!) 97.5 F (36.4 C) (Temporal)   Ht 5\' 4"  (1.626 m)   Wt 129 lb (58.5 kg)    SpO2 94%   BMI 22.14 kg/m   GEN: Well nourished, well developed, in no acute distress  Cardiac: RRR; no murmurs, rubs, or gallops,no edema -  Respiratory:  normal respiratory rate and pattern with no distress - normal breath sounds with no rales, rhonchi, wheezes or rubs GI: normal bowel sounds, no masses or tenderness MS: no deformity or atrophy  Skin: warm and dry, no rash  Neuro:  Alert and Oriented x 3, Strength and sensation are intact - CN II-Xii grossly intact Psych: euthymic mood, appropriate affect and demeanor   BP 112/62 (BP Location: Left Arm, Patient Position: Sitting, Cuff Size:  Normal)   Pulse 78   Temp (!) 97.5 F (36.4 C) (Temporal)   Ht 5\' 4"  (1.626 m)   Wt 129 lb (58.5 kg)   SpO2 94%   BMI 22.14 kg/m  Wt Readings from Last 3 Encounters:  02/01/21 129 lb (58.5 kg)  01/03/21 128 lb (58.1 kg)  10/22/20 128 lb (58.1 kg)     Health Maintenance Due  Topic Date Due  . PAP SMEAR-Modifier  Never done  . COVID-19 Vaccine (3 - Moderna risk 4-dose series) 09/30/2020    There are no preventive care reminders to display for this patient.  Lab Results  Component Value Date   TSH 1.880 10/22/2020   Lab Results  Component Value Date   WBC 11.3 (H) 10/22/2020   HGB 13.5 10/22/2020   HCT 39.4 10/22/2020   MCV 92 10/22/2020   PLT 341 10/22/2020   Lab Results  Component Value Date   NA 140 10/22/2020   K 4.2 10/22/2020   CO2 18 (L) 10/22/2020   GLUCOSE 89 10/22/2020   BUN 12 10/22/2020   CREATININE 0.61 10/22/2020   BILITOT 0.3 10/22/2020   ALKPHOS 76 10/22/2020   AST 15 10/22/2020   ALT 9 10/22/2020   PROT 6.3 10/22/2020   ALBUMIN 4.3 10/22/2020   CALCIUM 9.5 10/22/2020   Lab Results  Component Value Date   CHOL 227 (H) 10/22/2020   Lab Results  Component Value Date   HDL 60 10/22/2020   Lab Results  Component Value Date   LDLCALC 143 (H) 10/22/2020   Lab Results  Component Value Date   TRIG 136 10/22/2020   Lab Results  Component  Value Date   CHOLHDL 3.8 10/22/2020   No results found for: HGBA1C    Assessment & Plan:   Problem List Items Addressed This Visit      Respiratory   Chronic obstructive pulmonary disease (HCC)   Relevant Medications   cetirizine (ZYRTEC) 10 MG tablet     Endocrine   Other specified hypothyroidism   Relevant Orders   TSH     Other   Mixed hyperlipidemia   Relevant Medications   ezetimibe (ZETIA) 10 MG tablet   Other Relevant Orders   CBC with Differential/Platelet   Comprehensive metabolic panel   Lipid panel   Hormone replacement therapy (HRT)   Relevant Medications   estradiol (ESTRACE) 0.5 MG tablet    Other Visit Diagnoses    Seasonal allergic rhinitis due to other allergic trigger    -  Primary   Relevant Medications   cetirizine (ZYRTEC) 10 MG tablet   Vitamin D insufficiency       Relevant Orders   VITAMIN D 25 Hydroxy (Vit-D Deficiency, Fractures)      Meds ordered this encounter  Medications  . estradiol (ESTRACE) 0.5 MG tablet    Sig: Take 1 tablet (0.5 mg total) by mouth daily.    Dispense:  90 tablet    Refill:  1    Order Specific Question:   Supervising Provider    AnswerRochel Brome [id number]  . ezetimibe (ZETIA) 10 MG tablet    Sig: Take 1 tablet (10 mg total) by mouth daily.    Dispense:  90 tablet    Refill:  1    Order Specific Question:   Supervising Provider    AnswerRochel Brome [id number]  . cetirizine (ZYRTEC) 10 MG tablet    Sig: Take 1 tablet (10 mg total) by  mouth daily.    Dispense:  30 tablet    Refill:  11    Order Specific Question:   Supervising Provider    AnswerRochel Brome [phone]    Follow-up: Return in about 3 months (around 05/01/2021) for chronic fasting follow up.    SARA R Yassine Brunsman, [name]

## 2021-02-02 LAB — CBC WITH DIFFERENTIAL/PLATELET
Basophils Absolute: 0.1 10*3/uL (ref 0.0–0.2)
Basos: 1 %
EOS (ABSOLUTE): 0.3 10*3/uL (ref 0.0–0.4)
Eos: 2 %
Hematocrit: 40.5 % (ref 34.0–46.6)
Hemoglobin: 13.6 g/dL (ref 11.1–15.9)
Immature Grans (Abs): 0.1 10*3/uL (ref 0.0–0.1)
Immature Granulocytes: 1 %
Lymphocytes Absolute: 3.9 10*3/uL — ABNORMAL HIGH (ref 0.7–3.1)
Lymphs: 36 %
MCH: 30.8 pg (ref 26.6–33.0)
MCHC: 33.6 g/dL (ref 31.5–35.7)
MCV: 92 fL (ref 79–97)
Monocytes Absolute: 0.8 10*3/uL (ref 0.1–0.9)
Monocytes: 7 %
Neutrophils Absolute: 5.7 10*3/uL (ref 1.4–7.0)
Neutrophils: 53 %
Platelets: 455 10*3/uL — ABNORMAL HIGH (ref 150–450)
RBC: 4.42 x10E6/uL (ref 3.77–5.28)
RDW: 12.7 % (ref 11.7–15.4)
WBC: 10.8 10*3/uL (ref 3.4–10.8)

## 2021-02-02 LAB — COMPREHENSIVE METABOLIC PANEL
ALT: 11 IU/L (ref 0–32)
AST: 17 IU/L (ref 0–40)
Albumin/Globulin Ratio: 2 (ref 1.2–2.2)
Albumin: 4.1 g/dL (ref 3.8–4.8)
Alkaline Phosphatase: 89 IU/L (ref 44–121)
BUN/Creatinine Ratio: 13 (ref 12–28)
BUN: 7 mg/dL — ABNORMAL LOW (ref 8–27)
Bilirubin Total: 0.2 mg/dL (ref 0.0–1.2)
CO2: 21 mmol/L (ref 20–29)
Calcium: 9.9 mg/dL (ref 8.7–10.3)
Chloride: 105 mmol/L (ref 96–106)
Creatinine, Ser: 0.56 mg/dL — ABNORMAL LOW (ref 0.57–1.00)
GFR calc Af Amer: 116 mL/min/{1.73_m2} (ref >59)
GFR calc non Af Amer: 101 mL/min/{1.73_m2} (ref >59)
Globulin, Total: 2.1 g/dL (ref 1.5–4.5)
Glucose: 92 mg/dL (ref 65–99)
Potassium: 4.3 mmol/L (ref 3.5–5.2)
Sodium: 140 mmol/L (ref 134–144)
Total Protein: 6.2 g/dL (ref 6.0–8.5)

## 2021-02-02 LAB — LIPID PANEL
Chol/HDL Ratio: 3.9 ratio (ref 0.0–4.4)
Cholesterol, Total: 217 mg/dL — ABNORMAL HIGH (ref 100–199)
HDL: 56 mg/dL (ref >39)
LDL Chol Calc (NIH): 137 mg/dL — ABNORMAL HIGH (ref 0–99)
Triglycerides: 135 mg/dL (ref 0–149)
VLDL Cholesterol Cal: 24 mg/dL (ref 5–40)

## 2021-02-02 LAB — TSH: TSH: 1.98 u[IU]/mL (ref 0.450–4.500)

## 2021-02-02 LAB — VITAMIN D 25 HYDROXY (VIT D DEFICIENCY, FRACTURES): Vit D, 25-Hydroxy: 30.2 ng/mL (ref 30.0–100.0)

## 2021-02-02 LAB — CARDIOVASCULAR RISK ASSESSMENT

## 2021-02-21 ENCOUNTER — Other Ambulatory Visit: Payer: Self-pay | Admitting: Physician Assistant

## 2021-03-20 ENCOUNTER — Telehealth: Payer: Self-pay

## 2021-03-20 NOTE — Telephone Encounter (Signed)
As far as cardiac symptoms would recommend to be seen if having chest pain/flutters etc Xanax four times daily is not advised --- may need to adjust/start other medications Needs appt --- 30 min -

## 2021-03-20 NOTE — Telephone Encounter (Signed)
Pt scheduled an appointment for tomorrow.   Pt is no longer having symptoms. Has not had symptoms today. Called initially for "reassurance." Pt agreed and stated she did not advise herself taking xanax 4 times a day.   Royce Macadamia, [place] 03/20/21 3:03 PM

## 2021-03-20 NOTE — Telephone Encounter (Signed)
Pt calling questioning what to do about anxiety. She has had flutters, "brick on my chest," BP did go up but now back down. Pt has been monitoring BP. Most recent BP taken this morning was 136/63 pulse 80. Pt has been stressed as her brother passed away last 03-13-23. She was staying with him while he was sick. When he passed and before she came home there were family disputes. Pt began taken 4 xanax a day instead of 3. Since she has returned home she has been taking 3 a day. She just wants reassurance from PA. Does not want medication for breakthrough symptoms. Just wants reassurance.   Royce Macadamia, [place] 03/20/21 2:27 PM

## 2021-03-21 ENCOUNTER — Ambulatory Visit: Payer: 59 | Admitting: Physician Assistant

## 2021-03-21 ENCOUNTER — Encounter: Payer: Self-pay | Admitting: Physician Assistant

## 2021-03-21 ENCOUNTER — Other Ambulatory Visit: Payer: Self-pay

## 2021-03-21 VITALS — BP 156/82 | HR 72 | Temp 97.2°F | Ht 65.0 in | Wt 126.0 lb

## 2021-03-21 DIAGNOSIS — R0789 Other chest pain: Secondary | ICD-10-CM

## 2021-03-21 DIAGNOSIS — I10 Essential (primary) hypertension: Secondary | ICD-10-CM | POA: Insufficient documentation

## 2021-03-21 DIAGNOSIS — F419 Anxiety disorder, unspecified: Secondary | ICD-10-CM | POA: Diagnosis not present

## 2021-03-21 HISTORY — DX: Essential (primary) hypertension: I10

## 2021-03-21 HISTORY — DX: Other chest pain: R07.89

## 2021-03-21 NOTE — Progress Notes (Signed)
Subjective:  Patient ID: Lori Wise, female    DOB: 12-27-1960  Age: 60 y.o. MRN: [id number]  Chief Complaint  Patient presents with  . Hypertension    Has only been taking Atenolol 1 time daily which has been controlling her b/p readings. Patient is POA of her Brother (passed away last week). Family members are now stressing patient out. Yesterday had some tightness in her chest and felt some flutters.    HPI  pt complains of chest tightness and palpitations for the past week - this was after her brother recently passed away - she states she has been very stressed out by family and that is when all of her symptoms started She does have history of htn and supposed to be on atenolol 25mg  2 po qd but has only been taking one daily She states her bp has been elevated 160s/90s at home and pulse up to 120 States she has not seen cardiologist 'in awhile'  Pt is currently on xanax as needed for her anxiety and states she would not like to add in other medication for her stress/anxiety at this time Current Outpatient Medications on File Prior to Visit  Medication Sig Dispense Refill  . albuterol (VENTOLIN HFA) 108 (90 Base) MCG/ACT inhaler Inhale 2 puffs into the lungs every 6 (six) hours as needed for wheezing or shortness of breath. 8 g 2  . ALPRAZolam (XANAX) 0.5 MG tablet TAKE ONE TABLET BY MOUTH 3 TIMES DAILY 90 tablet 1  . Ascorbic Acid (VITAMIN C PO) Take by mouth.    Marland Kitchen aspirin 81 MG chewable tablet Chew 81 mg by mouth daily.    Marland Kitchen atenolol (TENORMIN) 25 MG tablet TAKE 2 TABLETS BY MOUTH EVERY DAY *MUST HAVE APPOINTMENT* 60 tablet 3  . cetirizine (ZYRTEC) 10 MG tablet Take 1 tablet (10 mg total) by mouth daily. 30 tablet 11  . Cyanocobalamin (VITAMIN B-12 PO) Take by mouth.    . estradiol (ESTRACE) 0.5 MG tablet Take 1 tablet (0.5 mg total) by mouth daily. 90 tablet 1  . ezetimibe (ZETIA) 10 MG tablet Take 1 tablet (10 mg total) by mouth daily. 90 tablet 1  . ibuprofen (ADVIL) 800 MG  tablet TAKE ONE TABLET BY MOUTH EVERY 8 HOURS AS NEEDED FOR PAIN 60 tablet 2  . ipratropium-albuterol (DUONEB) 0.5-2.5 (3) MG/3ML SOLN 1 vial in neb q 4-6 hours prn 360 mL 0  . levothyroxine (SYNTHROID) 25 MCG tablet TAKE ONE TABLET BY MOUTH EVERY DAY 30 tablet 2  . Omega-3 1000 MG CAPS Take by mouth.    Marland Kitchen omeprazole (PRILOSEC) 20 MG capsule TAKE ONE CAPSULE BY MOUTH EVERY DAY 30 capsule 1  . SYMBICORT 160-4.5 MCG/ACT inhaler Inhale 2 puffs into the lungs 2 (two) times daily. 10.2 g 3  . Vitamin D, Ergocalciferol, (DRISDOL) 1.25 MG (50000 UNIT) CAPS capsule Take 50,000 Units by mouth every 7 (seven) days.    Marland Kitchen VITAMIN E PO Take by mouth.     No current facility-administered medications on file prior to visit.   Past Medical History:  Diagnosis Date  . Chronic obstructive pulmonary disease with (acute) lower respiratory infection ([place])   . Essential (primary) hypertension   . Migraine without aura, not intractable, without status migrainosus   . Mixed hyperlipidemia   . Sunburn of second degree    Past Surgical History:  Procedure Laterality Date  . ABDOMINAL HYSTERECTOMY    . CARDIAC CATHETERIZATION  2000   normal  . CHOLECYSTECTOMY    .  OTHER SURGICAL HISTORY     Ear surgeries x5    Family History  Problem Relation Age of Onset  . Suicidality Mother   . Lung cancer Father   . Cancer Brother        Brain Cancer  . Cancer Sister        Cell-1  . Lung cancer Brother    Social History   Socioeconomic History  . Marital status: Legally Separated    Spouse name: Not on file  . Number of children: 2  . Years of education: Not on file  . Highest education level: Not on file  Occupational History  . Occupation: [employer] service  Tobacco Use  . Smoking status: Current Every Day Smoker    Packs/day: 1.00    Types: Cigarettes  . Smokeless tobacco: Never Used  Vaping Use  . Vaping Use: Never used  Substance and Sexual Activity  . Alcohol use: Not Currently    Comment:  Drinks approximately two times per week.  . Drug use: Never  . Sexual activity: Not on file  Other Topics Concern  . Not on file  Social History Narrative  . Not on file   Social Determinants of Health   Financial Resource Strain: Not on file  Food Insecurity: Not on file  Transportation Needs: Not on file  Physical Activity: Not on file  Stress: Not on file  Social Connections: Not on file    Review of Systems  CONSTITUTIONAL: Negative for chills, fatigue, fever, unintentional weight gain and unintentional weight loss.  E/N/T: Negative for ear pain, nasal congestion and sore throat.  CARDIOVASCULAR: see HPI RESPIRATORY: Negative for recent cough and dyspnea.  GASTROINTESTINAL: Negative for abdominal pain, acid reflux symptoms, constipation, diarrhea, nausea and vomiting.  PSYCHIATRIC: see HPI     Objective:  BP (!) 156/82   Pulse 72   Temp (!) 97.2 F (36.2 C)   Ht 5\' 5"  (1.651 m)   Wt 126 lb (57.2 kg)   SpO2 99%   BMI 20.97 kg/m   BP/Weight 03/21/2021 02/01/2021 07/17/1750  Systolic BP 025 852 778  Diastolic BP 82 62 68  Wt. (Lbs) 126 129 128  BMI 20.97 22.14 21.3    Physical Exam PHYSICAL EXAM:   VS: BP (!) 156/82   Pulse 72   Temp (!) 97.2 F (36.2 C)   Ht 5\' 5"  (1.651 m)   Wt 126 lb (57.2 kg)   SpO2 99%   BMI 20.97 kg/m   GEN: Well nourished, well developed, in no acute distress  Cardiac: RRR; no murmurs, rubs, or gallops,no edema -  Respiratory:  normal respiratory rate and pattern with no distress - normal breath sounds with no rales, rhonchi, wheezes or rubs Neuro:  Alert and Oriented x 3, Strength and sensation are intact - CN II-Xii grossly intact Psych: euthymic mood, appropriate affect and demeanor  ekg normal Diabetic Foot Exam - Simple   No data filed      Lab Results  Component Value Date   WBC 10.8 02/01/2021   HGB 13.6 02/01/2021   HCT 40.5 02/01/2021   PLT 455 (H) 02/01/2021   GLUCOSE 92 02/01/2021   CHOL 217 (H) 02/01/2021    TRIG 135 02/01/2021   HDL 56 02/01/2021   LDLCALC 137 (H) 02/01/2021   ALT 11 02/01/2021   AST 17 02/01/2021   NA 140 02/01/2021   K 4.3 02/01/2021   CL 105 02/01/2021   CREATININE 0.56 (L) 02/01/2021  BUN 7 (L) 02/01/2021   CO2 21 02/01/2021   TSH 1.980 02/01/2021      Assessment & Plan:   1. Other chest pain - EKG 12-Lead - CBC with Differential/Platelet - Comprehensive metabolic panel - TSH Referral to cardiology 2. Primary hypertension Take atenolol 25mg  bid Recheck bp in 1-2 weeks 3. Anxiety  continue current meds  No orders of the defined types were placed in this encounter.   Orders Placed This Encounter  Procedures  . CBC with Differential/Platelet  . Comprehensive metabolic panel  . TSH  . Ambulatory referral to Cardiology  . EKG 12-Lead     Follow-up: No follow-ups on file.  An After Visit Summary was printed and given to the patient.  Yetta Flock [facility] [phone]

## 2021-03-22 LAB — COMPREHENSIVE METABOLIC PANEL
ALT: 9 IU/L (ref 0–32)
AST: 16 IU/L (ref 0–40)
Albumin/Globulin Ratio: 2.1 (ref 1.2–2.2)
Albumin: 3.9 g/dL (ref 3.8–4.8)
Alkaline Phosphatase: 91 IU/L (ref 44–121)
BUN/Creatinine Ratio: 10 — ABNORMAL LOW (ref 12–28)
BUN: 5 mg/dL — ABNORMAL LOW (ref 8–27)
Bilirubin Total: 0.2 mg/dL (ref 0.0–1.2)
CO2: 21 mmol/L (ref 20–29)
Calcium: 9.2 mg/dL (ref 8.7–10.3)
Chloride: 105 mmol/L (ref 96–106)
Creatinine, Ser: 0.49 mg/dL — ABNORMAL LOW (ref 0.57–1.00)
Globulin, Total: 1.9 g/dL (ref 1.5–4.5)
Glucose: 88 mg/dL (ref 65–99)
Potassium: 3.9 mmol/L (ref 3.5–5.2)
Sodium: 144 mmol/L (ref 134–144)
Total Protein: 5.8 g/dL — ABNORMAL LOW (ref 6.0–8.5)
eGFR: 107 mL/min/{1.73_m2} (ref >59)

## 2021-03-22 LAB — CBC WITH DIFFERENTIAL/PLATELET
Basophils Absolute: 0.1 10*3/uL (ref 0.0–0.2)
Basos: 1 %
EOS (ABSOLUTE): 0.2 10*3/uL (ref 0.0–0.4)
Eos: 2 %
Hematocrit: 38.1 % (ref 34.0–46.6)
Hemoglobin: 12.5 g/dL (ref 11.1–15.9)
Immature Grans (Abs): 0 10*3/uL (ref 0.0–0.1)
Immature Granulocytes: 0 %
Lymphocytes Absolute: 3.7 10*3/uL — ABNORMAL HIGH (ref 0.7–3.1)
Lymphs: 37 %
MCH: 30.6 pg (ref 26.6–33.0)
MCHC: 32.8 g/dL (ref 31.5–35.7)
MCV: 93 fL (ref 79–97)
Monocytes Absolute: 0.6 10*3/uL (ref 0.1–0.9)
Monocytes: 6 %
Neutrophils Absolute: 5.3 10*3/uL (ref 1.4–7.0)
Neutrophils: 54 %
Platelets: 333 10*3/uL (ref 150–450)
RBC: 4.08 x10E6/uL (ref 3.77–5.28)
RDW: 12.8 % (ref 11.7–15.4)
WBC: 10 10*3/uL (ref 3.4–10.8)

## 2021-03-22 LAB — TSH: TSH: 1.86 u[IU]/mL (ref 0.450–4.500)

## 2021-03-27 ENCOUNTER — Other Ambulatory Visit: Payer: Self-pay | Admitting: Physician Assistant

## 2021-03-27 DIAGNOSIS — E782 Mixed hyperlipidemia: Secondary | ICD-10-CM

## 2021-04-01 ENCOUNTER — Other Ambulatory Visit: Payer: Self-pay

## 2021-04-01 ENCOUNTER — Ambulatory Visit (INDEPENDENT_AMBULATORY_CARE_PROVIDER_SITE_OTHER): Payer: 59

## 2021-04-01 VITALS — BP 122/62

## 2021-04-01 DIAGNOSIS — I10 Essential (primary) hypertension: Secondary | ICD-10-CM

## 2021-04-01 DIAGNOSIS — J440 Chronic obstructive pulmonary disease with acute lower respiratory infection: Secondary | ICD-10-CM | POA: Insufficient documentation

## 2021-04-01 DIAGNOSIS — L551 Sunburn of second degree: Secondary | ICD-10-CM | POA: Insufficient documentation

## 2021-04-01 DIAGNOSIS — G43009 Migraine without aura, not intractable, without status migrainosus: Secondary | ICD-10-CM | POA: Insufficient documentation

## 2021-04-01 NOTE — Progress Notes (Signed)
      Patient came in today for a BP check today.  BP today is 122/60.  She denies any dizziness or feeling light headed.    She was in to see her PCP on 03/21/21 and BP was 156/82 with elevated home readings.  She was not taking her Atenolol 25 mg twice daily as prescribed at that time.    She states that she is now taking her medication as directed.  She brought in her home BP machine and readings and they are within normal range.  Shelle Iron, LPN 06/00/45 99:77 AM

## 2021-04-04 ENCOUNTER — Encounter: Payer: Self-pay | Admitting: Cardiology

## 2021-04-04 ENCOUNTER — Ambulatory Visit (INDEPENDENT_AMBULATORY_CARE_PROVIDER_SITE_OTHER): Payer: 59 | Admitting: Cardiology

## 2021-04-04 ENCOUNTER — Other Ambulatory Visit: Payer: Self-pay

## 2021-04-04 VITALS — BP 132/78 | HR 73 | Ht 65.0 in | Wt 125.6 lb

## 2021-04-04 DIAGNOSIS — Z720 Tobacco use: Secondary | ICD-10-CM

## 2021-04-04 DIAGNOSIS — I10 Essential (primary) hypertension: Secondary | ICD-10-CM

## 2021-04-04 DIAGNOSIS — R0789 Other chest pain: Secondary | ICD-10-CM | POA: Diagnosis not present

## 2021-04-04 DIAGNOSIS — E785 Hyperlipidemia, unspecified: Secondary | ICD-10-CM

## 2021-04-04 MED ORDER — METOPROLOL TARTRATE 100 MG PO TABS: ORAL_TABLET | ORAL | 0 refills | Status: DC

## 2021-04-04 NOTE — Patient Instructions (Addendum)
Medication Instructions:  Your physician recommends that you continue on your current medications as directed. Please refer to the Current Medication list given to you today. *If you need a refill on your cardiac medications before your next appointment, please call your pharmacy*   Lab Work: Your physician recommends that you return for lab work: 3-7 days before CT: BMET, Mag If you have labs (blood work) drawn today and your tests are completely normal, you will receive your results only by: Marland Kitchen MyChart Message (if you have MyChart) OR . A paper copy in the mail If you have any lab test that is abnormal or we need to change your treatment, we will call you to review the results.   Testing/Procedures: Your cardiac CT will be scheduled at:  [facility] [address] [place], [place] [address] [phone]   If scheduled at [facility], please arrive at the [facility] main entrance (entrance A) of [facility] 30 minutes prior to test start time. Proceed to the [facility] Radiology Department (first floor) to check-in and test prep.   Please follow these instructions carefully (unless otherwise directed):   On the Night Before the Test: . Be sure to Drink plenty of water. . Do not consume any caffeinated/decaffeinated beverages or chocolate 12 hours prior to your test. . Do not take any antihistamines 12 hours prior to your test.  On the Day of the Test: . Drink plenty of water until 1 hour prior to the test. . Do not eat any food 4 hours prior to the test. . You may take your regular medications prior to the test. . HOLD Atenolol on the day of procedure.   . Take metoprolol (Lopressor) two hours prior to test. . FEMALES- please wear underwire-free bra if available.       After the Test: . Drink plenty of water. . After receiving IV contrast, you may experience a mild flushed feeling. This is normal. . On occasion, you may experience a mild  rash up to 24 hours after the test. This is not dangerous. If this occurs, you can take Benadryl 25 mg and increase your fluid intake. . If you experience trouble breathing, this can be serious. If it is severe call 911 IMMEDIATELY. If it is mild, please call our office. . If you take any of these medications: Glipizide/Metformin, Avandament, Glucavance, please do not take 48 hours after completing test unless otherwise instructed.   Once we have confirmed authorization from your insurance company, we will call you to set up a date and time for your test. Based on how quickly your insurance processes prior authorizations requests, please allow up to 4 weeks to be contacted for scheduling your Cardiac CT appointment. Be advised that routine Cardiac CT appointments could be scheduled as many as 8 weeks after your provider has ordered it.  For non-scheduling related questions, please contact the cardiac imaging nurse navigator should you have any questions/concerns: Marchia Bond, Cardiac Imaging Nurse Navigator Gordy Clement, Cardiac Imaging Nurse Navigator [facility] Direct Office Dial: [phone]   For scheduling needs, including cancellations and rescheduling, please call [place], [phone].   Follow-Up: At [facility], you and your health needs are our priority.  As part of our continuing mission to provide you with exceptional heart care, we have created designated Provider Care Teams.  These Care Teams include your primary Cardiologist (physician) and Advanced Practice Providers (APPs -  Physician Assistants and Nurse  Practitioners) who all work together to provide you with the care you need, when you need it.  We recommend signing up for the patient portal called "MyChart".  Sign up information is provided on this After Visit Summary.  MyChart is used to connect with patients for Virtual Visits (Telemedicine).  Patients are able to view lab/test results,  encounter notes, upcoming appointments, etc.  Non-urgent messages can be sent to your provider as well.   To learn more about what you can do with MyChart, go to [email or web address].    Your next appointment:   3 month(s)  The format for your next appointment:   In Person  Provider:   Berniece Salines, DO   Other Instructions

## 2021-04-04 NOTE — Progress Notes (Signed)
Cardiology Office Note:    Date:  04/04/2021   ID:  Lori Wise, DOB 18-Jul-1961, MRN [id number]  PCP:  Marge Duncans, [name]  Cardiologist:  Berniece Salines, DO  Electrophysiologist:  None   Referring MD: Marge Duncans, [name]   I am having chest pain  History of Present Illness:    Lori Wise is a 60 y.o. female with a hx of hypertension, hyperlipidemia, COPD presents to be evaluated for chest pain and request of her primary doctor.  The patient tells me that over the last 3 weeks she has had significant chest discomfort.  She tells me this started when she lost her brother 3 weeks ago.  She notes that she has significant family pressure for everybody wanting to take her brother thanks.  The pain started as a midsternal chest discomfort which was intermittent.  She did see her PCP who recommended she see cardiology.  She tells me the pain has resolved but is concerned.  Past Medical History:  Diagnosis Date  . Acute laryngopharyngitis 04/19/2020  . Anxiety 04/19/2020  . Atypical nevus 04/19/2020  . Bronchitis 04/30/2020  . Chronic obstructive pulmonary disease ([place]) 10/22/2020  . Chronic obstructive pulmonary disease with (acute) lower respiratory infection ([place])   . Encounter for immunization 04/19/2020  . Essential (primary) hypertension   . Gastroesophageal reflux disease 10/22/2020  . Hormone replacement therapy (HRT) 10/22/2020  . Migraine without aura, not intractable, without status migrainosus   . Mixed hyperlipidemia   . Other chest pain 03/21/2021  . Other specified hypothyroidism 04/19/2020  . Primary hypertension 03/21/2021  . Right leg pain 07/23/2020  . Sunburn of second degree     Past Surgical History:  Procedure Laterality Date  . ABDOMINAL HYSTERECTOMY    . CARDIAC CATHETERIZATION  2000   normal  . CHOLECYSTECTOMY    . OTHER SURGICAL HISTORY     Ear surgeries x5    Current Medications: Current Meds  Medication Sig  . albuterol (VENTOLIN HFA) 108 (90 Base) MCG/ACT  inhaler Inhale 2 puffs into the lungs every 6 (six) hours as needed for wheezing or shortness of breath.  . ALPRAZolam (XANAX) 0.5 MG tablet TAKE ONE TABLET BY MOUTH 3 TIMES DAILY  . Ascorbic Acid (VITAMIN C PO) Take by mouth.  Marland Kitchen aspirin 81 MG chewable tablet Chew 81 mg by mouth daily.  Marland Kitchen atenolol (TENORMIN) 25 MG tablet TAKE 2 TABLETS BY MOUTH EVERY DAY *MUST HAVE APPOINTMENT*  . cetirizine (ZYRTEC) 10 MG tablet Take 1 tablet (10 mg total) by mouth daily.  . Cyanocobalamin (VITAMIN B-12 PO) Take by mouth.  . estradiol (ESTRACE) 0.5 MG tablet Take 1 tablet (0.5 mg total) by mouth daily.  Marland Kitchen ezetimibe (ZETIA) 10 MG tablet TAKE ONE TABLET BY MOUTH DAILY  . ibuprofen (ADVIL) 800 MG tablet TAKE ONE TABLET BY MOUTH EVERY 8 HOURS AS NEEDED FOR PAIN  . ipratropium-albuterol (DUONEB) 0.5-2.5 (3) MG/3ML SOLN 1 vial in neb q 4-6 hours prn  . levothyroxine (SYNTHROID) 25 MCG tablet TAKE ONE TABLET BY MOUTH EVERY DAY  . metoprolol tartrate (LOPRESSOR) 100 MG tablet Take 2 hours prior to CT  . Omega-3 1000 MG CAPS Take by mouth.  Marland Kitchen omeprazole (PRILOSEC) 20 MG capsule TAKE ONE CAPSULE BY MOUTH EVERY DAY  . SYMBICORT 160-4.5 MCG/ACT inhaler Inhale 2 puffs into the lungs 2 (two) times daily.  . Vitamin D, Ergocalciferol, (DRISDOL) 1.25 MG (50000 UNIT) CAPS capsule Take 50,000 Units by mouth every 7 (seven) days.  Marland Kitchen  VITAMIN E PO Take by mouth.     Allergies:   Codeine   Social History   Socioeconomic History  . Marital status: Legally Separated    Spouse name: Not on file  . Number of children: 2  . Years of education: Not on file  . Highest education level: Not on file  Occupational History  . Occupation: [employer] service  Tobacco Use  . Smoking status: Current Every Day Smoker    Packs/day: 1.00    Types: Cigarettes  . Smokeless tobacco: Never Used  Vaping Use  . Vaping Use: Never used  Substance and Sexual Activity  . Alcohol use: Not Currently    Comment: Drinks approximately two times  per week.  . Drug use: Never  . Sexual activity: Not on file  Other Topics Concern  . Not on file  Social History Narrative  . Not on file   Social Determinants of Health   Financial Resource Strain: Not on file  Food Insecurity: Not on file  Transportation Needs: Not on file  Physical Activity: Not on file  Stress: Not on file  Social Connections: Not on file     Family History: The patient's family history includes Cancer in her brother and sister; Lung cancer in her brother and father; Suicidality in her mother.  ROS:   Review of Systems  Constitution: Negative for decreased appetite, fever and weight gain.  HENT: Negative for congestion, ear discharge, hoarse voice and sore throat.   Eyes: Negative for discharge, redness, vision loss in right eye and visual halos.  Cardiovascular: Reports chest pain.  Negative for dyspnea on exertion, leg swelling, orthopnea and palpitations.  Respiratory: Negative for cough, hemoptysis, shortness of breath and snoring.   Endocrine: Negative for heat intolerance and polyphagia.  Hematologic/Lymphatic: Negative for bleeding problem. Does not bruise/bleed easily.  Skin: Negative for flushing, nail changes, rash and suspicious lesions.  Musculoskeletal: Negative for arthritis, joint pain, muscle cramps, myalgias, neck pain and stiffness.  Gastrointestinal: Negative for abdominal pain, bowel incontinence, diarrhea and excessive appetite.  Genitourinary: Negative for decreased libido, genital sores and incomplete emptying.  Neurological: Negative for brief paralysis, focal weakness, headaches and loss of balance.  Psychiatric/Behavioral: Negative for altered mental status, depression and suicidal ideas.  Allergic/Immunologic: Negative for HIV exposure and persistent infections.    EKGs/Labs/Other Studies Reviewed:    The following studies were reviewed today:   EKG:  The ekg ordered today demonstrates sinus rhythm, heart rate 73  bpm  Recent Labs: 03/21/2021: ALT 9; BUN 5; Creatinine, Ser 0.49; Hemoglobin 12.5; Platelets 333; Potassium 3.9; Sodium 144; TSH 1.860  Recent Lipid Panel    Component Value Date/Time   CHOL 217 (H) 02/01/2021 1022   TRIG 135 02/01/2021 1022   HDL 56 02/01/2021 1022   CHOLHDL 3.9 02/01/2021 1022   LDLCALC 137 (H) 02/01/2021 1022    Physical Exam:    VS:  BP 132/78 (BP Location: Right Arm)   Pulse 73   Ht 5\' 5"  (1.651 m)   Wt 125 lb 9.6 oz (57 kg)   SpO2 97%   BMI 20.90 kg/m     Wt Readings from Last 3 Encounters:  04/04/21 125 lb 9.6 oz (57 kg)  03/21/21 126 lb (57.2 kg)  02/01/21 129 lb (58.5 kg)     GEN: Well nourished, well developed in no acute distress HEENT: Normal NECK: No JVD; No carotid bruits LYMPHATICS: No lymphadenopathy CARDIAC: S1S2 noted,RRR, no murmurs, rubs, gallops RESPIRATORY:  Clear  to auscultation without rales, wheezing or rhonchi  ABDOMEN: Soft, non-tender, non-distended, +bowel sounds, no guarding. EXTREMITIES: No edema, No cyanosis, no clubbing MUSCULOSKELETAL:  No deformity  SKIN: Warm and dry NEUROLOGIC:  Alert and oriented x 3, non-focal PSYCHIATRIC:  Normal affect, good insight  ASSESSMENT:    1. Tobacco use   2. Other chest pain   3. Hypertension, unspecified type   4. Hyperlipidemia, unspecified hyperlipidemia type    PLAN:     The symptoms of chest pain is concerning, this patient does have intermediate risk for coronary artery disease and at this time I would like to pursue an ischemic evaluation in this patient.  Shared decision a coronary CTA at this time is appropriate.  I have discussed with the patient about the testing.  The patient has no IV contrast allergy and is agreeable to proceed with this test.  Her blood pressure deceptively in the office today.  I encouraged the patient to continue taking her atenolol 25 mg twice daily as prescribed by her PCP.  Continue patient on her lipid-lowering agent.  Smoking cessation  advised.  The patient is in agreement with the above plan. The patient left the office in stable condition.  The patient will follow up in   Medication Adjustments/Labs and Tests Ordered: Current medicines are reviewed at length with the patient today.  Concerns regarding medicines are outlined above.  Orders Placed This Encounter  Procedures  . CT CORONARY MORPH W/CTA COR W/SCORE W/CA W/CM &/OR WO/CM  . CT CORONARY FRACTIONAL FLOW RESERVE DATA PREP  . CT CORONARY FRACTIONAL FLOW RESERVE FLUID ANALYSIS  . Basic metabolic panel  . Magnesium  . EKG 12-Lead   Meds ordered this encounter  Medications  . metoprolol tartrate (LOPRESSOR) 100 MG tablet    Sig: Take 2 hours prior to CT    Dispense:  1 tablet    Refill:  0    Patient Instructions  Medication Instructions:  Your physician recommends that you continue on your current medications as directed. Please refer to the Current Medication list given to you today. *If you need a refill on your cardiac medications before your next appointment, please call your pharmacy*   Lab Work: Your physician recommends that you return for lab work: 3-7 days before CT: BMET, Mag If you have labs (blood work) drawn today and your tests are completely normal, you will receive your results only by: Marland Kitchen MyChart Message (if you have MyChart) OR . A paper copy in the mail If you have any lab test that is abnormal or we need to change your treatment, we will call you to review the results.   Testing/Procedures: Your cardiac CT will be scheduled at:  [facility] [address] [place], [place] [address] [phone]   If scheduled at [facility], please arrive at the [facility] main entrance (entrance A) of [facility] 30 minutes prior to test start time. Proceed to the [facility] Radiology Department (first floor) to check-in and test prep.   Please follow these instructions carefully (unless otherwise  directed):   On the Night Before the Test: . Be sure to Drink plenty of water. . Do not consume any caffeinated/decaffeinated beverages or chocolate 12 hours prior to your test. . Do not take any antihistamines 12 hours prior to your test.  On the Day of the Test: . Drink plenty of water until 1 hour prior to the test. . Do not eat any food  4 hours prior to the test. . You may take your regular medications prior to the test. . HOLD Atenolol on the day of procedure.   . Take metoprolol (Lopressor) two hours prior to test. . FEMALES- please wear underwire-free bra if available.       After the Test: . Drink plenty of water. . After receiving IV contrast, you may experience a mild flushed feeling. This is normal. . On occasion, you may experience a mild rash up to 24 hours after the test. This is not dangerous. If this occurs, you can take Benadryl 25 mg and increase your fluid intake. . If you experience trouble breathing, this can be serious. If it is severe call 911 IMMEDIATELY. If it is mild, please call our office. . If you take any of these medications: Glipizide/Metformin, Avandament, Glucavance, please do not take 48 hours after completing test unless otherwise instructed.   Once we have confirmed authorization from your insurance company, we will call you to set up a date and time for your test. Based on how quickly your insurance processes prior authorizations requests, please allow up to 4 weeks to be contacted for scheduling your Cardiac CT appointment. Be advised that routine Cardiac CT appointments could be scheduled as many as 8 weeks after your provider has ordered it.  For non-scheduling related questions, please contact the cardiac imaging nurse navigator should you have any questions/concerns: Marchia Bond, Cardiac Imaging Nurse Navigator Gordy Clement, Cardiac Imaging Nurse Navigator [facility] Direct Office Dial: [phone]   For  scheduling needs, including cancellations and rescheduling, please call [place], [phone].   Follow-Up: At [facility], you and your health needs are our priority.  As part of our continuing mission to provide you with exceptional heart care, we have created designated Provider Care Teams.  These Care Teams include your primary Cardiologist (physician) and Advanced Practice Providers (APPs -  Physician Assistants and Nurse Practitioners) who all work together to provide you with the care you need, when you need it.  We recommend signing up for the patient portal called "MyChart".  Sign up information is provided on this After Visit Summary.  MyChart is used to connect with patients for Virtual Visits (Telemedicine).  Patients are able to view lab/test results, encounter notes, upcoming appointments, etc.  Non-urgent messages can be sent to your provider as well.   To learn more about what you can do with MyChart, go to [email or web address].    Your next appointment:   3 month(s)  The format for your next appointment:   In Person  Provider:   Berniece Salines, DO   Other Instructions      Adopting a Healthy Lifestyle.  Know what a healthy weight is for you (roughly BMI <25) and aim to maintain this   Aim for 7+ servings of fruits and vegetables daily   65-80+ fluid ounces of water or unsweet tea for healthy kidneys   Limit to max 1 drink of alcohol per day; avoid smoking/tobacco   Limit animal fats in diet for cholesterol and heart health - choose grass fed whenever available   Avoid highly processed foods, and foods high in saturated/trans fats   Aim for low stress - take time to unwind and care for your mental health   Aim for 150 min of moderate intensity exercise weekly for heart health, and weights twice weekly for bone health   Aim for 7-9 hours of sleep daily   When it  comes to diets, agreement about the perfect plan isnt easy to find, even among the  experts. Experts at the [employer] developed an idea known as the Healthy Eating Plate. Just imagine a plate divided into logical, healthy portions.   The emphasis is on diet quality:   Load up on vegetables and fruits - one-half of your plate: Aim for color and variety, and remember that potatoes dont count.   Go for whole grains - one-quarter of your plate: Whole wheat, barley, wheat berries, quinoa, oats, brown rice, and foods made with them. If you want pasta, go with whole wheat pasta.   Protein power - one-quarter of your plate: Fish, chicken, beans, and nuts are all healthy, versatile protein sources. Limit red meat.   The diet, however, does go beyond the plate, offering a few other suggestions.   Use healthy plant oils, such as olive, canola, soy, corn, sunflower and peanut. Check the labels, and avoid partially hydrogenated oil, which have unhealthy trans fats.   If youre thirsty, drink water. Coffee and tea are good in moderation, but skip sugary drinks and limit milk and dairy products to one or two daily servings.   The type of carbohydrate in the diet is more important than the amount. Some sources of carbohydrates, such as vegetables, fruits, whole grains, and beans-are healthier than others.   Finally, stay active  Signed, Berniece Salines, DO  04/04/2021 4:48 PM    [facility]

## 2021-04-11 ENCOUNTER — Telehealth (HOSPITAL_COMMUNITY): Payer: Self-pay | Admitting: *Deleted

## 2021-04-11 LAB — MAGNESIUM: Magnesium: 1.9 mg/dL (ref 1.6–2.3)

## 2021-04-11 LAB — BASIC METABOLIC PANEL
BUN/Creatinine Ratio: 16 (ref 12–28)
BUN: 9 mg/dL (ref 8–27)
CO2: 24 mmol/L (ref 20–29)
Calcium: 9.5 mg/dL (ref 8.7–10.3)
Chloride: 109 mmol/L — ABNORMAL HIGH (ref 96–106)
Creatinine, Ser: 0.56 mg/dL — ABNORMAL LOW (ref 0.57–1.00)
Glucose: 116 mg/dL — ABNORMAL HIGH (ref 65–99)
Potassium: 3.4 mmol/L — ABNORMAL LOW (ref 3.5–5.2)
Sodium: 142 mmol/L (ref 134–144)
eGFR: 104 mL/min/{1.73_m2} (ref >59)

## 2021-04-11 NOTE — Telephone Encounter (Signed)
Reaching out to patient to offer assistance regarding upcoming cardiac imaging study; pt verbalizes understanding of appt date/time, parking situation and where to check in, pre-test NPO status and medications ordered, and verified current allergies; name and call back number provided for further questions should they arise  Marthann Abshier RN Navigator Cardiac Imaging [facility] [phone] office [phone] cell  Pt to take 100mg metoprolol tartrate 2 hours prior to cardiac CT scan. 

## 2021-04-12 ENCOUNTER — Other Ambulatory Visit: Payer: Self-pay

## 2021-04-12 ENCOUNTER — Encounter (HOSPITAL_COMMUNITY): Payer: Self-pay

## 2021-04-12 ENCOUNTER — Ambulatory Visit (HOSPITAL_COMMUNITY)
Admission: RE | Admit: 2021-04-12 | Discharge: 2021-04-12 | Disposition: A | Discharge status: Home / Self Care | Payer: 59 | Source: Ambulatory Visit | Attending: Cardiology | Admitting: Cardiology

## 2021-04-12 DIAGNOSIS — R0789 Other chest pain: Secondary | ICD-10-CM | POA: Diagnosis not present

## 2021-04-12 DIAGNOSIS — Z006 Encounter for examination for normal comparison and control in clinical research program: Secondary | ICD-10-CM

## 2021-04-12 MED ORDER — NITROGLYCERIN 0.4 MG SL SUBL
SUBLINGUAL_TABLET | SUBLINGUAL | Status: AC
  Administered 2021-04-12: 0.8 mg via SUBLINGUAL
  Filled 2021-04-12: qty 2

## 2021-04-12 MED ORDER — NITROGLYCERIN 0.4 MG SL SUBL: 0.8000 mg | SUBLINGUAL_TABLET | Freq: Once | SUBLINGUAL | Status: AC

## 2021-04-12 MED ORDER — IOHEXOL 350 MG/ML SOLN
80.0000 mL | Freq: Once | INTRAVENOUS | Status: AC | PRN
  Administered 2021-04-12: 80 mL via INTRAVENOUS

## 2021-04-12 NOTE — Research (Signed)
IDENTIFY Informed Consent                  Subject Name: Lori Wise. Shall   Subject met inclusion and exclusion criteria.  The informed consent form, study requirements and expectations were reviewed with the subject and questions and concerns were addressed prior to the signing of the consent form.  The subject verbalized understanding of the trial requirements.  The subject agreed to participate in the IDENTIFY trial and signed the informed consent at 11:37AM on 04/12/21.  The informed consent was obtained prior to performance of any protocol-specific procedures for the subject.  A copy of the signed informed consent was given to the subject and a copy was placed in the subject's medical record.   Sallye Ober , Research Assistant

## 2021-05-01 ENCOUNTER — Other Ambulatory Visit: Payer: Self-pay | Admitting: Physician Assistant

## 2021-05-01 DIAGNOSIS — Z1211 Encounter for screening for malignant neoplasm of colon: Secondary | ICD-10-CM

## 2021-05-10 ENCOUNTER — Other Ambulatory Visit: Payer: Self-pay | Admitting: Physician Assistant

## 2021-05-10 DIAGNOSIS — E782 Mixed hyperlipidemia: Secondary | ICD-10-CM

## 2021-05-27 ENCOUNTER — Other Ambulatory Visit: Payer: Self-pay | Admitting: Physician Assistant

## 2021-05-28 NOTE — Telephone Encounter (Signed)
Call pt and ask if she is taking atenolol or lopressor Should not be on both

## 2021-05-29 NOTE — Telephone Encounter (Signed)
Please correct in chart -- -take out the metoprolol please

## 2021-05-29 NOTE — Telephone Encounter (Signed)
Pt takes atenolol. Does not take metoprolol.   Lori Wise, [place] 05/29/21 11:24 AM

## 2021-06-07 ENCOUNTER — Other Ambulatory Visit: Payer: Self-pay

## 2021-06-07 DIAGNOSIS — Z1231 Encounter for screening mammogram for malignant neoplasm of breast: Secondary | ICD-10-CM

## 2021-07-04 ENCOUNTER — Ambulatory Visit (INDEPENDENT_AMBULATORY_CARE_PROVIDER_SITE_OTHER): Payer: 59 | Admitting: Physician Assistant

## 2021-07-04 ENCOUNTER — Encounter: Payer: Self-pay | Admitting: Physician Assistant

## 2021-07-04 ENCOUNTER — Other Ambulatory Visit: Payer: Self-pay

## 2021-07-04 VITALS — BP 130/74 | HR 74 | Temp 97.5°F | Ht 65.0 in | Wt 130.4 lb

## 2021-07-04 DIAGNOSIS — I10 Essential (primary) hypertension: Secondary | ICD-10-CM

## 2021-07-04 DIAGNOSIS — F419 Anxiety disorder, unspecified: Secondary | ICD-10-CM | POA: Diagnosis not present

## 2021-07-04 DIAGNOSIS — R5383 Other fatigue: Secondary | ICD-10-CM

## 2021-07-04 MED ORDER — ESCITALOPRAM OXALATE 10 MG PO TABS: 10.0000 mg | ORAL_TABLET | Freq: Every day | ORAL | 2 refills | Status: DC

## 2021-07-04 NOTE — Addendum Note (Signed)
Addended byMarge Duncans on: 07/04/2021 10:56 AM   Modules accepted: Orders

## 2021-07-04 NOTE — Progress Notes (Signed)
Subjective:  Patient ID: Lori Wise, female    DOB: 1961-02-13  Age: 60 y.o. MRN: [id number]  Chief Complaint  Patient presents with   Hypertension    HPI  Pt here for follow up of hypertension - she is doing overall well with bp readings and voices no problems or concerns -- had also seen cardiology for issues and all of her testing has been normal - she will see Dr Harriet Masson in September  Pt states she is having symptoms of irritability and feeling overwhelmed - currently only taking xanax as needed for anxiety --- has not been on SSRI in many years Recommend to start treatment Current Outpatient Medications on File Prior to Visit  Medication Sig Dispense Refill   albuterol (VENTOLIN HFA) 108 (90 Base) MCG/ACT inhaler Inhale 2 puffs into the lungs every 6 (six) hours as needed for wheezing or shortness of breath. 8 g 2   ALPRAZolam (XANAX) 0.5 MG tablet TAKE ONE TABLET BY MOUTH 3 TIMES DAILY 90 tablet 1   Ascorbic Acid (VITAMIN C PO) Take by mouth.     aspirin 81 MG chewable tablet Chew 81 mg by mouth daily.     atenolol (TENORMIN) 25 MG tablet 2 po qd 60 tablet 3   cetirizine (ZYRTEC) 10 MG tablet Take 1 tablet (10 mg total) by mouth daily. 30 tablet 11   Cyanocobalamin (VITAMIN B-12 PO) Take by mouth.     estradiol (ESTRACE) 0.5 MG tablet Take 1 tablet (0.5 mg total) by mouth daily. 90 tablet 1   ezetimibe (ZETIA) 10 MG tablet TAKE ONE TABLET BY MOUTH DAILY 30 tablet 1   ibuprofen (ADVIL) 800 MG tablet TAKE ONE TABLET BY MOUTH EVERY 8 HOURS AS NEEDED FOR PAIN 60 tablet 2   ipratropium-albuterol (DUONEB) 0.5-2.5 (3) MG/3ML SOLN 1 vial in neb q 4-6 hours prn 360 mL 0   levothyroxine (SYNTHROID) 25 MCG tablet TAKE ONE TABLET BY MOUTH EVERY DAY 30 tablet 2   Omega-3 1000 MG CAPS Take by mouth.     omeprazole (PRILOSEC) 20 MG capsule TAKE ONE CAPSULE BY MOUTH EVERY DAY 30 capsule 1   SYMBICORT 160-4.5 MCG/ACT inhaler Inhale 2 puffs into the lungs 2 (two) times daily. 10.2 g 3   Vitamin D,  Ergocalciferol, (DRISDOL) 1.25 MG (50000 UNIT) CAPS capsule Take 50,000 Units by mouth every 7 (seven) days.     VITAMIN E PO Take by mouth.     No current facility-administered medications on file prior to visit.   Past Medical History:  Diagnosis Date   Acute laryngopharyngitis 04/19/2020   Anxiety 04/19/2020   Atypical nevus 04/19/2020   Bronchitis 04/30/2020   Chronic obstructive pulmonary disease ([place]) 10/22/2020   Chronic obstructive pulmonary disease with (acute) lower respiratory infection ([place])    Encounter for immunization 04/19/2020   Essential (primary) hypertension    Gastroesophageal reflux disease 10/22/2020   Hormone replacement therapy (HRT) 10/22/2020   Migraine without aura, not intractable, without status migrainosus    Mixed hyperlipidemia    Other chest pain 03/21/2021   Other specified hypothyroidism 04/19/2020   Primary hypertension 03/21/2021   Right leg pain 07/23/2020   Sunburn of second degree    Past Surgical History:  Procedure Laterality Date   ABDOMINAL HYSTERECTOMY     CARDIAC CATHETERIZATION  2000   normal   CHOLECYSTECTOMY     OTHER SURGICAL HISTORY     Ear surgeries x5    Family History  Problem Relation Age of  Onset   Suicidality Mother    Lung cancer Father    Cancer Brother        Brain Cancer   Cancer Sister        Cell-1   Lung cancer Brother    Social History   Socioeconomic History   Marital status: Legally Separated    Spouse name: Not on file   Number of children: 2   Years of education: Not on file   Highest education level: Not on file  Occupational History   Occupation: [employer]  Tobacco Use   Smoking status: Every Day    Packs/day: 1.00    Types: Cigarettes   Smokeless tobacco: Never  Vaping Use   Vaping Use: Never used  Substance and Sexual Activity   Alcohol use: Not Currently    Comment: Drinks approximately two times per week.   Drug use: Never   Sexual activity: Not on file  Other Topics Concern    Not on file  Social History Narrative   Not on file   Social Determinants of Health   Financial Resource Strain: Not on file  Food Insecurity: Not on file  Transportation Needs: Not on file  Physical Activity: Not on file  Stress: Not on file  Social Connections: Not on file    Review of Systems CONSTITUTIONAL: see HPI CARDIOVASCULAR: Negative for chest pain, dizziness, palpitations and pedal edema.  RESPIRATORY: Negative for recent cough and dyspnea.  GASTROINTESTINAL: Negative for abdominal pain, acid reflux symptoms, constipation, diarrhea, nausea and vomiting.  NEUROLOGICAL: Negative for dizziness and headaches.  PSYCHIATRIC: see HPI      Objective:  BP 130/74 (BP Location: Left Arm, Patient Position: Sitting, Cuff Size: Normal)   Pulse 74   Temp (!) 97.5 F (36.4 C) (Temporal)   Ht '5\' 5"'$  (1.651 m)   Wt 130 lb 6.4 oz (59.1 kg)   SpO2 98%   BMI 21.70 kg/m   BP/Weight 07/04/2021 04/12/2021 [id number]  Systolic BP [id number]  Diastolic BP 74 48 78  Wt. (Lbs) 130.4 - 125.6  BMI 21.7 - 20.9    Physical Exam PHYSICAL EXAM:   VS: BP 130/74 (BP Location: Left Arm, Patient Position: Sitting, Cuff Size: Normal)   Pulse 74   Temp (!) 97.5 F (36.4 C) (Temporal)   Ht '5\' 5"'$  (1.651 m)   Wt 130 lb 6.4 oz (59.1 kg)   SpO2 98%   BMI 21.70 kg/m   GEN: Well nourished, well developed, in no acute distress  Cardiac: RRR; no murmurs, rubs, or gallops, Respiratory:  normal respiratory rate and pattern with no distress - normal breath sounds with no rales, rhonchi, wheezes or rubs Psych: euthymic mood, appropriate affect and demeanor  Diabetic Foot Exam - Simple   No data filed      Lab Results  Component Value Date   WBC 10.0 03/21/2021   HGB 12.5 03/21/2021   HCT 38.1 03/21/2021   PLT 333 03/21/2021   GLUCOSE 116 (H) 04/11/2021   CHOL 217 (H) 02/01/2021   TRIG 135 02/01/2021   HDL 56 02/01/2021   LDLCALC 137 (H) 02/01/2021   ALT 9 03/21/2021   AST 16  03/21/2021   NA 142 04/11/2021   K 3.4 (L) 04/11/2021   CL 109 (H) 04/11/2021   CREATININE 0.56 (L) 04/11/2021   BUN 9 04/11/2021   CO2 24 04/11/2021   TSH 1.860 03/21/2021      Assessment & Plan:   1. Primary  hypertension Continue current meds 2. Anxiety - escitalopram (LEXAPRO) 10 MG tablet; Take 1 tablet (10 mg total) by mouth daily.  Dispense: 30 tablet; Refill: 2   Meds ordered this encounter  Medications   escitalopram (LEXAPRO) 10 MG tablet    Sig: Take 1 tablet (10 mg total) by mouth daily.    Dispense:  30 tablet    Refill:  2    Order Specific Question:   Supervising Provider    Answer:   [name]    No orders of the defined types were placed in this encounter.    Follow-up: Return in about 3 months (around 10/04/2021) for follow up.  An After Visit Summary was printed and given to the patient.  Yetta Flock [facility] [phone]

## 2021-07-05 LAB — COMPREHENSIVE METABOLIC PANEL
ALT: 10 IU/L (ref 0–32)
AST: 19 IU/L (ref 0–40)
Albumin/Globulin Ratio: 1.9 (ref 1.2–2.2)
Albumin: 4.1 g/dL (ref 3.8–4.8)
Alkaline Phosphatase: 78 IU/L (ref 44–121)
BUN/Creatinine Ratio: 12 (ref 12–28)
BUN: 9 mg/dL (ref 8–27)
Bilirubin Total: 0.3 mg/dL (ref 0.0–1.2)
CO2: 21 mmol/L (ref 20–29)
Calcium: 9.6 mg/dL (ref 8.7–10.3)
Chloride: 107 mmol/L — ABNORMAL HIGH (ref 96–106)
Creatinine, Ser: 0.74 mg/dL (ref 0.57–1.00)
Globulin, Total: 2.2 g/dL (ref 1.5–4.5)
Glucose: 92 mg/dL (ref 65–99)
Potassium: 4.1 mmol/L (ref 3.5–5.2)
Sodium: 143 mmol/L (ref 134–144)
Total Protein: 6.3 g/dL (ref 6.0–8.5)
eGFR: 92 mL/min/{1.73_m2} (ref >59)

## 2021-07-05 LAB — CBC WITH DIFFERENTIAL/PLATELET
Basophils Absolute: 0.1 10*3/uL (ref 0.0–0.2)
Basos: 1 %
EOS (ABSOLUTE): 0.3 10*3/uL (ref 0.0–0.4)
Eos: 3 %
Hematocrit: 41.5 % (ref 34.0–46.6)
Hemoglobin: 13.3 g/dL (ref 11.1–15.9)
Immature Grans (Abs): 0.1 10*3/uL (ref 0.0–0.1)
Immature Granulocytes: 1 %
Lymphocytes Absolute: 4.9 10*3/uL — ABNORMAL HIGH (ref 0.7–3.1)
Lymphs: 45 %
MCH: 29.5 pg (ref 26.6–33.0)
MCHC: 32 g/dL (ref 31.5–35.7)
MCV: 92 fL (ref 79–97)
Monocytes Absolute: 0.8 10*3/uL (ref 0.1–0.9)
Monocytes: 8 %
Neutrophils Absolute: 4.4 10*3/uL (ref 1.4–7.0)
Neutrophils: 42 %
Platelets: 326 10*3/uL (ref 150–450)
RBC: 4.51 x10E6/uL (ref 3.77–5.28)
RDW: 12.7 % (ref 11.7–15.4)
WBC: 10.5 10*3/uL (ref 3.4–10.8)

## 2021-07-05 LAB — TSH: TSH: 3.14 u[IU]/mL (ref 0.450–4.500)

## 2021-07-10 ENCOUNTER — Other Ambulatory Visit: Payer: Self-pay | Admitting: Physician Assistant

## 2021-07-10 DIAGNOSIS — E782 Mixed hyperlipidemia: Secondary | ICD-10-CM

## 2021-07-11 ENCOUNTER — Telehealth: Payer: Self-pay | Admitting: *Deleted

## 2021-07-11 DIAGNOSIS — Z006 Encounter for examination for normal comparison and control in clinical research program: Secondary | ICD-10-CM

## 2021-07-11 NOTE — Telephone Encounter (Signed)
I called patient for 90-day Identify phone call and left message for patient to call me. I also e-mailed patient.

## 2021-07-22 ENCOUNTER — Encounter: Payer: Self-pay | Admitting: *Deleted

## 2021-07-22 NOTE — Research (Unsigned)
Patient called for 72 IDENTIFY phone call. States she is doing well and not having any more problems. We will call her in May for her final call for the study.       [place] Khary Schaben,Research Assistant  07/22/2020  14:05

## 2021-07-23 ENCOUNTER — Other Ambulatory Visit: Payer: Self-pay | Admitting: Physician Assistant

## 2021-07-24 ENCOUNTER — Ambulatory Visit: Payer: 59 | Admitting: Cardiology

## 2021-07-24 ENCOUNTER — Other Ambulatory Visit: Payer: Self-pay | Admitting: Physician Assistant

## 2021-08-02 ENCOUNTER — Other Ambulatory Visit: Payer: Self-pay | Admitting: Physician Assistant

## 2021-08-02 ENCOUNTER — Ambulatory Visit (INDEPENDENT_AMBULATORY_CARE_PROVIDER_SITE_OTHER): Payer: 59

## 2021-08-02 ENCOUNTER — Other Ambulatory Visit: Payer: Self-pay

## 2021-08-02 DIAGNOSIS — Z23 Encounter for immunization: Secondary | ICD-10-CM

## 2021-08-02 DIAGNOSIS — Z1231 Encounter for screening mammogram for malignant neoplasm of breast: Secondary | ICD-10-CM

## 2021-08-03 ENCOUNTER — Other Ambulatory Visit: Payer: Self-pay | Admitting: Physician Assistant

## 2021-08-03 DIAGNOSIS — Z79890 Hormone replacement therapy (postmenopausal): Secondary | ICD-10-CM

## 2021-08-05 ENCOUNTER — Telehealth: Payer: Self-pay | Admitting: Physician Assistant

## 2021-08-05 NOTE — Telephone Encounter (Signed)
   Lori Wise has been scheduled for the following appointment:  WHAT: SCREENING MAMMOGRAM WHERE: [facility] DATE: 08/13/21 TIME: 3:00 PM ARRIVAL TIME  Patient has been made aware.

## 2021-08-14 ENCOUNTER — Other Ambulatory Visit: Payer: Self-pay | Admitting: Physician Assistant

## 2021-08-14 ENCOUNTER — Ambulatory Visit: Payer: 59 | Admitting: Cardiology

## 2021-08-26 ENCOUNTER — Other Ambulatory Visit: Payer: Self-pay | Admitting: Physician Assistant

## 2021-08-26 DIAGNOSIS — J40 Bronchitis, not specified as acute or chronic: Secondary | ICD-10-CM

## 2021-08-29 ENCOUNTER — Other Ambulatory Visit: Payer: Self-pay | Admitting: Physician Assistant

## 2021-08-29 DIAGNOSIS — E782 Mixed hyperlipidemia: Secondary | ICD-10-CM

## 2021-08-30 ENCOUNTER — Other Ambulatory Visit: Payer: Self-pay

## 2021-08-30 ENCOUNTER — Encounter: Payer: Self-pay | Admitting: Cardiology

## 2021-08-30 ENCOUNTER — Ambulatory Visit (INDEPENDENT_AMBULATORY_CARE_PROVIDER_SITE_OTHER): Payer: 59 | Admitting: Cardiology

## 2021-08-30 ENCOUNTER — Ambulatory Visit: Payer: 59 | Admitting: Cardiology

## 2021-08-30 VITALS — BP 134/84 | HR 65 | Ht 65.0 in | Wt 134.2 lb

## 2021-08-30 DIAGNOSIS — F17200 Nicotine dependence, unspecified, uncomplicated: Secondary | ICD-10-CM

## 2021-08-30 DIAGNOSIS — J449 Chronic obstructive pulmonary disease, unspecified: Secondary | ICD-10-CM | POA: Diagnosis not present

## 2021-08-30 DIAGNOSIS — R0789 Other chest pain: Secondary | ICD-10-CM

## 2021-08-30 DIAGNOSIS — E782 Mixed hyperlipidemia: Secondary | ICD-10-CM

## 2021-08-30 DIAGNOSIS — I10 Essential (primary) hypertension: Secondary | ICD-10-CM

## 2021-08-30 NOTE — Progress Notes (Signed)
Cardiology Office Note:    Date:  08/30/2021   ID:  Lori Wise, DOB 12/05/1961, MRN [id number]  PCP:  Marge Duncans, [name]  Cardiologist:  Jenne Campus, MD    Referring MD: Marge Duncans, [name]   Chief Complaint  Patient presents with   Follow-up   Establish Care    History of Present Illness:    Lori Wise is a 60 y.o. female who is a chronic smoker she used to smoke about 2 packs/day now with about 1 pack/day, she does have already COPD, essential hypertension, dyslipidemia.  Recently she was seen because of atypical chest pain.  Coronary CT angio was performed which was normal there was no coronary artery disease.  She comes today to talk about it.  She said she does not have any chest pain anymore.  She gets short of breath easily while walking however she try to exercise some and is not aerobic exercise.  She still continues to smoke.  She reduce amount of cigarettes from 2 packs/day to only 1 pack/day.  Past Medical History:  Diagnosis Date   Acute laryngopharyngitis 04/19/2020   Anxiety 04/19/2020   Atypical nevus 04/19/2020   Bronchitis 04/30/2020   Chronic obstructive pulmonary disease ([place]) 10/22/2020   Chronic obstructive pulmonary disease with (acute) lower respiratory infection ([place])    Encounter for immunization 04/19/2020   Essential (primary) hypertension    Gastroesophageal reflux disease 10/22/2020   Hormone replacement therapy (HRT) 10/22/2020   Migraine without aura, not intractable, without status migrainosus    Mixed hyperlipidemia    Other chest pain 03/21/2021   Other specified hypothyroidism 04/19/2020   Primary hypertension 03/21/2021   Right leg pain 07/23/2020   Sunburn of second degree     Past Surgical History:  Procedure Laterality Date   ABDOMINAL HYSTERECTOMY     CARDIAC CATHETERIZATION  2000   normal   CHOLECYSTECTOMY     OTHER SURGICAL HISTORY     Ear surgeries x5    Current Medications: Current Meds  Medication Sig   albuterol (VENTOLIN  HFA) 108 (90 Base) MCG/ACT inhaler Inhale 2 puffs into the lungs every 6 (six) hours as needed for wheezing or shortness of breath.   ALPRAZolam (XANAX) 0.5 MG tablet TAKE ONE TABLET BY MOUTH 3 TIMES DAILY (Patient taking differently: Take 0.5 mg by mouth 3 (three) times daily.)   Ascorbic Acid (VITAMIN C PO) Take 1 tablet by mouth daily. Unknown strength   aspirin 81 MG chewable tablet Chew 81 mg by mouth daily.   atenolol (TENORMIN) 25 MG tablet TAKE 2 TABLETS BY MOUTH EVERY DAY (Patient taking differently: Take 50 mg by mouth daily. TAKE 2 TABLETS BY MOUTH EVERY DAY)   cetirizine (ZYRTEC) 10 MG tablet Take 1 tablet (10 mg total) by mouth daily.   Cyanocobalamin (VITAMIN B-12 PO) Take 1 tablet by mouth daily. Unknown strenght   escitalopram (LEXAPRO) 10 MG tablet Take 1 tablet (10 mg total) by mouth daily.   estradiol (ESTRACE) 0.5 MG tablet Take 1 tablet (0.5 mg total) by mouth daily.   ezetimibe (ZETIA) 10 MG tablet TAKE ONE TABLET BY MOUTH DAILY (Patient taking differently: Take 10 mg by mouth daily.)   ibuprofen (ADVIL) 800 MG tablet TAKE ONE TABLET BY MOUTH EVERY 8 HOURS AS NEEDED FOR PAIN (Patient taking differently: Take 800 mg by mouth every 8 (eight) hours as needed for mild pain or moderate pain.)   ipratropium-albuterol (DUONEB) 0.5-2.5 (3) MG/3ML SOLN USE 1 VIAL IN NEBULIZER  EVERY 4 TO 6 HOURS AS NEEDED (Patient taking differently: Inhale 3 mLs into the lungs every 4 (four) hours as needed (SOB). USE 1 VIAL IN NEBULIZER EVERY 4 TO 6 HOURS AS NEEDED)   levothyroxine (SYNTHROID) 25 MCG tablet TAKE ONE TABLET BY MOUTH EVERY DAY (Patient taking differently: Take 25 mcg by mouth daily before breakfast.)   Omega-3 1000 MG CAPS Take 1 capsule by mouth daily.   omeprazole (PRILOSEC) 20 MG capsule TAKE ONE CAPSULE BY MOUTH EVERY DAY (Patient taking differently: Take 20 mg by mouth daily.)   SYMBICORT 160-4.5 MCG/ACT inhaler Inhale 2 puffs into the lungs 2 (two) times daily. (Patient taking  differently: Inhale 2 puffs into the lungs in the morning and at bedtime.)   Vitamin D, Ergocalciferol, (DRISDOL) 1.25 MG (50000 UNIT) CAPS capsule Take 50,000 Units by mouth every 7 (seven) days.   VITAMIN E PO Take 1 tablet by mouth daily at 6 (six) AM. Unknown strength     Allergies:   Codeine   Social History   Socioeconomic History   Marital status: Legally Separated    Spouse name: Not on file   Number of children: 2   Years of education: Not on file   Highest education level: Not on file  Occupational History   Occupation: [employer]  Tobacco Use   Smoking status: Every Day    Packs/day: 1.00    Types: Cigarettes   Smokeless tobacco: Never  Vaping Use   Vaping Use: Never used  Substance and Sexual Activity   Alcohol use: Not Currently    Comment: Drinks approximately two times per week.   Drug use: Never   Sexual activity: Not on file  Other Topics Concern   Not on file  Social History Narrative   Not on file   Social Determinants of Health   Financial Resource Strain: Not on file  Food Insecurity: Not on file  Transportation Needs: Not on file  Physical Activity: Not on file  Stress: Not on file  Social Connections: Not on file     Family History: The patient's family history includes Cancer in her brother and sister; Lung cancer in her brother and father; Suicidality in her mother. ROS:   Please see the history of present illness.    All 14 point review of systems negative except as described per history of present illness  EKGs/Labs/Other Studies Reviewed:      Recent Labs: 04/11/2021: Magnesium 1.9 07/04/2021: ALT 10; BUN 9; Creatinine, Ser 0.74; Hemoglobin 13.3; Platelets 326; Potassium 4.1; Sodium 143; TSH 3.140  Recent Lipid Panel    Component Value Date/Time   CHOL 217 (H) 02/01/2021 1022   TRIG 135 02/01/2021 1022   HDL 56 02/01/2021 1022   CHOLHDL 3.9 02/01/2021 1022   LDLCALC 137 (H) 02/01/2021 1022    Physical Exam:    VS:   BP 134/84 (BP Location: Left Arm, Patient Position: Sitting)   Pulse 65   Ht 5\' 5"  (1.651 m)   Wt 134 lb 3.2 oz (60.9 kg)   SpO2 98%   BMI 22.33 kg/m     Wt Readings from Last 3 Encounters:  08/30/21 134 lb 3.2 oz (60.9 kg)  07/04/21 130 lb 6.4 oz (59.1 kg)  04/04/21 125 lb 9.6 oz (57 kg)     GEN:  Well nourished, well developed in no acute distress HEENT: Normal NECK: No JVD; No carotid bruits LYMPHATICS: No lymphadenopathy CARDIAC: RRR, no murmurs, no rubs, no gallops RESPIRATORY:  Clear to auscultation without rales, wheezing or rhonchi  ABDOMEN: Soft, non-tender, non-distended MUSCULOSKELETAL:  No edema; No deformity  SKIN: Warm and dry LOWER EXTREMITIES: no swelling NEUROLOGIC:  Alert and oriented x 3 PSYCHIATRIC:  Normal affect   ASSESSMENT:    1. Atypical chest pain   2. Mixed hyperlipidemia   3. Chronic obstructive pulmonary disease, unspecified COPD type ([place])   4. Primary hypertension   5. Smoking    PLAN:    In order of problems listed above:  Atypical chest pain denies having any, coronary CT angio reviewed with her showing no abnormalities.  The key will be now risk factors modifications.  She is on aspirin 81 mg daily which I will continue, of course we spent at least 10 minutes talking about need to quit smoking I did presented her with different alternatives to quit smoking.  She told me straight that she is not much interested in it.  And honestly I do not think she will be able to do that. Dyslipidemia I did review her K PN which show me her LDL of 137 HDL 56 this is on Zetia.  I will recheck her cholesterol today and then most likely she required statin. COPD already present.  Noted of course this is related to smoking. Essential hypertension her blood pressure seems to be well controlled continue present management.   Medication Adjustments/Labs and Tests Ordered: Current medicines are reviewed at length with the patient today.  Concerns regarding  medicines are outlined above.  No orders of the defined types were placed in this encounter.  Medication changes: No orders of the defined types were placed in this encounter.   Signed, Park Liter, MD, [facility] 08/30/2021 9:36 AM    [place]

## 2021-08-30 NOTE — Patient Instructions (Signed)
Medication Instructions:  Your physician recommends that you continue on your current medications as directed. Please refer to the Current Medication list given to you today.  *If you need a refill on your cardiac medications before your next appointment, please call your pharmacy*   Lab Work: Your physician has recommended you have your lipids checked.  If you have labs (blood work) drawn today and your tests are completely normal, you will receive your results only by: [place] (if you have MyChart) OR A paper copy in the mail If you have any lab test that is abnormal or we need to change your treatment, we will call you to review the results.   Testing/Procedures: None ordered   Follow-Up: At [facility], you and your health needs are our priority.  As part of our continuing mission to provide you with exceptional heart care, we have created designated Provider Care Teams.  These Care Teams include your primary Cardiologist (physician) and Advanced Practice Providers (APPs -  Physician Assistants and Nurse Practitioners) who all work together to provide you with the care you need, when you need it.  We recommend signing up for the patient portal called "MyChart".  Sign up information is provided on this After Visit Summary.  MyChart is used to connect with patients for Virtual Visits (Telemedicine).  Patients are able to view lab/test results, encounter notes, upcoming appointments, etc.  Non-urgent messages can be sent to your provider as well.   To learn more about what you can do with MyChart, go to [email or web address].    Your next appointment:   6 month(s)  The format for your next appointment:   In Person  Provider:   Jenne Campus, MD   Other Instructions NA

## 2021-08-31 LAB — LIPID PANEL
Chol/HDL Ratio: 3.3 ratio (ref 0.0–4.4)
Cholesterol, Total: 216 mg/dL — ABNORMAL HIGH (ref 100–199)
HDL: 66 mg/dL (ref >39)
LDL Chol Calc (NIH): 133 mg/dL — ABNORMAL HIGH (ref 0–99)
Triglycerides: 98 mg/dL (ref 0–149)
VLDL Cholesterol Cal: 17 mg/dL (ref 5–40)

## 2021-09-02 MED ORDER — ROSUVASTATIN CALCIUM 5 MG PO TABS: 5.0000 mg | ORAL_TABLET | Freq: Every day | ORAL | 3 refills | Status: DC

## 2021-09-13 ENCOUNTER — Other Ambulatory Visit: Payer: Self-pay | Admitting: Physician Assistant

## 2021-09-28 ENCOUNTER — Other Ambulatory Visit: Payer: Self-pay | Admitting: Physician Assistant

## 2021-10-10 ENCOUNTER — Ambulatory Visit: Payer: 59 | Admitting: Physician Assistant

## 2021-10-14 ENCOUNTER — Other Ambulatory Visit: Payer: Self-pay | Admitting: Physician Assistant

## 2021-10-15 ENCOUNTER — Ambulatory Visit: Payer: 59 | Admitting: Physician Assistant

## 2021-10-16 ENCOUNTER — Ambulatory Visit (INDEPENDENT_AMBULATORY_CARE_PROVIDER_SITE_OTHER): Payer: 59 | Admitting: Physician Assistant

## 2021-10-16 ENCOUNTER — Other Ambulatory Visit: Payer: Self-pay

## 2021-10-16 ENCOUNTER — Other Ambulatory Visit: Payer: Self-pay | Admitting: Physician Assistant

## 2021-10-16 ENCOUNTER — Encounter: Payer: Self-pay | Admitting: Physician Assistant

## 2021-10-16 VITALS — BP 130/70 | HR 67 | Temp 96.1°F | Ht 65.0 in | Wt 135.6 lb

## 2021-10-16 DIAGNOSIS — E559 Vitamin D deficiency, unspecified: Secondary | ICD-10-CM

## 2021-10-16 DIAGNOSIS — J060 Acute laryngopharyngitis: Secondary | ICD-10-CM

## 2021-10-16 DIAGNOSIS — I10 Essential (primary) hypertension: Secondary | ICD-10-CM

## 2021-10-16 DIAGNOSIS — E038 Other specified hypothyroidism: Secondary | ICD-10-CM

## 2021-10-16 DIAGNOSIS — E782 Mixed hyperlipidemia: Secondary | ICD-10-CM | POA: Diagnosis not present

## 2021-10-16 NOTE — Progress Notes (Signed)
Established Patient Office Visit  Subjective:  Patient ID: Lori Wise, female    DOB: [birth year]  Age: 60 y.o. MRN: [id number]  CC:  Chief Complaint  Patient presents with   Hypertension     HPI Lori Wise presents for follow up hypertension  Pt presents for follow up of hypertension. . The patient is tolerating the medication well without side effects. Compliance with treatment has been good; including taking medication as directed , maintains a healthy diet and regular exercise regimen , and following up as directed. Is currently on tenormin 60m qd  Mixed hyperlipidemia  Pt presents with hyperlipidemia.  Compliance with treatment has been good The patient is compliant with medications, maintains a low cholesterol diet , follows up as directed , and maintains an exercise regimen . The patient denies experiencing any hypercholesterolemia related symptoms. Currently on fish oil and zetia as well as crestor 580mqd  Pt with history of hypothyroidism - currently on synthroid [id number] - due for labwork  Pt with history of low vit D - on supplements - due for labwork  Past Medical History:  Diagnosis Date   Acute laryngopharyngitis 04/19/2020   Anxiety 04/19/2020   Atypical nevus 04/19/2020   Bronchitis 04/30/2020   Ch[place] pulmonary disease ([place]/15/[place]   Chronic obstructive pulmonary disease with (acute) lower respiratory infection ([place]  Encounter for immunization 04/19/2020   Essential (primary) hypertension    Gastroesophageal reflux disease 10/22/2020   Hormone replacement therapy (HRT) 10/22/2020   Migraine without aura, not intractable, without status migrainosus    Mixed hyperlipidemia    Other chest pain 03/21/2021   Other specified hypothyroidism 04/19/2020   Primary hypertension 03/21/2021   Right leg pain 07/23/2020   Sunburn of second degree     Past Surgical History:  Procedure Laterality Date   ABDOMINAL HYSTERECTOMY     CARDIAC CATHETERIZATION   2000   normal   CHOLECYSTECTOMY     OTHER SURGICAL HISTORY     Ear surgeries x5    Family History  Problem Relation Age of Onset   Suicidality Mother    Lung cancer Father    Cancer Brother        Brain Cancer   Cancer Sister        Cell-1   Lung cancer Brother     Social History   Socioeconomic History   Marital status: Legally Separated    Spouse name: Not on file   Number of children: 2   Years of education: Not on file   Highest education level: Not on file  Occupational History   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   Drug use: Never   Sexual activity: Not on file  Other Topics Concern   Not on file  Social History Narrative   Not on file   Social Determinants of Health   Financial Resource Strain: Not on file  Food Insecurity: Not on file  Transportation Needs: Not on file  Physical Activity: Not on file  Stress: Not on file  Social Connections: Not on file  Intimate Partner Violence: Not on file     Current Outpatient Medications:  albuterol (VENTOLIN HFA) 108 (90 Base) MCG/ACT inhaler, Inhale 2 puffs into the lungs every 6 (six) hours as needed for wheezing or shortness of breath., Disp: 8 g, Rfl: 2   ALPRAZolam (XANAX) 0.5 MG tablet, TAKE ONE TABLET BY MOUTH 3 TIMES DAILY, Disp: 90 tablet, Rfl: 0   Ascorbic Acid (VITAMIN C PO), Take 1 tablet by mouth daily. Unknown strength, Disp: , Rfl:    aspirin 81 MG chewable tablet, Chew 81 mg by mouth daily., Disp: , Rfl:    atenolol (TENORMIN) 25 MG tablet, TAKE 2 TABLETS BY MOUTH EVERY DAY (Patient taking differently: Take 50 mg by mouth daily. TAKE 2 TABLETS BY MOUTH EVERY DAY), Disp: 60 tablet, Rfl: 3   cetirizine (ZYRTEC) 10 MG tablet, Take 1 tablet  (10 mg total) by mouth daily., Disp: 30 tablet, Rfl: 11   Cyanocobalamin (VITAMIN B-12 PO), Take 1 tablet by mouth daily. Unknown strenght, Disp: , Rfl:    escitalopram (LEXAPRO) 10 MG tablet, Take 1 tablet (10 mg total) by mouth daily., Disp: 30 tablet, Rfl: 2   estradiol (ESTRACE) 0.5 MG tablet, Take 1 tablet (0.5 mg total) by mouth daily., Disp: 90 tablet, Rfl: 1   ezetimibe (ZETIA) 10 MG tablet, TAKE ONE TABLET BY MOUTH DAILY (Patient taking differently: Take 10 mg by mouth daily.), Disp: 30 tablet, Rfl: 1   ibuprofen (ADVIL) 800 MG tablet, TAKE ONE TABLET BY MOUTH EVERY 8 HOURS AS NEEDED FOR PAIN (Patient taking differently: Take 800 mg by mouth every 8 (eight) hours as needed for mild pain or moderate pain.), Disp: 60 tablet, Rfl: 2   ipratropium-albuterol (DUONEB) 0.5-2.5 (3) MG/3ML SOLN, USE 1 VIAL IN NEBULIZER EVERY 4 TO 6 HOURS AS NEEDED (Patient taking differently: Inhale 3 mLs into the lungs every 4 (four) hours as needed (SOB). USE 1 VIAL IN NEBULIZER EVERY 4 TO 6 HOURS AS NEEDED), Disp: 360 mL, Rfl: 0   levothyroxine (SYNTHROID) 25 MCG tablet, TAKE ONE TABLET BY MOUTH EVERY DAY, Disp: 30 tablet, Rfl: 1   Omega-3 1000 MG CAPS, Take 1 capsule by mouth daily., Disp: , Rfl:    omeprazole (PRILOSEC) 20 MG capsule, TAKE ONE CAPSULE BY MOUTH EVERY DAY (Patient taking differently: Take 20 mg by mouth daily.), Disp: 30 capsule, Rfl: 1   rosuvastatin (CRESTOR) 5 MG tablet, Take 1 tablet (5 mg total) by mouth daily., Disp: 90 tablet, Rfl: 3   SYMBICORT 160-4.5 MCG/ACT inhaler, Inhale 2 puffs into the lungs 2 (two) times daily. (Patient taking differently: Inhale 2 puffs into the lungs in the morning and at bedtime.), Disp: 10.2 g, Rfl: 3   Vitamin D, Ergocalciferol, (DRISDOL) 1.25 MG (50000 UNIT) CAPS capsule, Take 50,000 Units by mouth every 7 (seven) days., Disp: , Rfl:    VITAMIN E PO, Take 1 tablet by mouth daily at 6 (six) AM. Unknown strength, Disp: , Rfl:    Allergies  Allergen Reactions    Codeine Hives, Itching, Other (See Comments), Rash and Nausea And Vomiting   CONSTITUTIONAL: Negative for chills, fatigue, fever, unintentional weight gain and unintentional weight loss.  E/N/T: Negative for ear pain, nasal congestion and sore throat.  CARDIOVASCULAR: Negative for chest pain, dizziness, palpitations and pedal edema.  RESPIRATORY: Negative for recent cough and dyspnea.  GASTROINTESTINAL: Negative for abdominal pain, acid reflux symptoms, constipation, diarrhea, nausea and vomiting.  MSK: Negative for arthralgias and myalgias.  INTEGUMENTARY: Negative for rash.  NEUROLOGICAL: Negative for dizziness and headaches.  PSYCHIATRIC: Negative for sleep disturbance and to  question depression screen.  Negative for depression, negative for anhedonia.      Objective:    PHYSICAL EXAM:   VS: BP 130/70 (BP Location: Left Arm, Patient Position: Sitting, Cuff Size: Normal)   Pulse 67   Temp (!) 96.1 F (35.6 C) (Temporal)   Ht _0  (1.651 m)   Wt 135 lb 9.6 oz (61.5 kg)   SpO2 98%   BMI 22.57 kg/m   GEN: Well nourished, well developed, in no acute distress  HEENT: normal external ears and nose - normal external auditory canals and TMS - hearing grossly normal -  - Lips, Teeth and Gums - normal  Oropharynx - normal mucosa, palate, and posterior pharynx Neck: no JVD or masses - no thyromegaly Cardiac: RRR; no murmurs, rubs, or gallops,no edema - no significant varicosities Respiratory:  CTA bilaterally  MS: no deformity or atrophy  Skin: warm and dry, no rash -  Neuro:  Alert and Oriented x 3, Strength and sensation are intact - CN II-Xii grossly intact Psych: euthymic mood, appropriate affect and demeanor  BP 130/70 (BP Location: Left Arm, Patient Position: Sitting, Cuff Size: Normal)   Pulse 67   Temp (!) 96.1 F (35.6 C) (Temporal)   Ht _1  (1.651 m)   Wt 135 lb 9.6 oz (61.5 kg)   SpO2 98%   BMI 22.57 kg/m  Wt Readings from Last 3 Encounters:  10/16/21 135 lb  9.6 oz (61.5 kg)  08/30/21 134 lb 3.2 oz (60.9 kg)  07/04/21 130 lb 6.4 oz (59.1 kg)     Health Maintenance Due  Topic Date Due   Pneumococcal Vaccine 59-82 Years old (1 - PCV) Never done   PAP SMEAR-Modifier  Never done    There are no preventive care reminders to display for this patient.  Lab Results  Component Value Date   TSH 3.140 07/04/2021   Lab Results  Component Value Date   WBC 10.5 07/04/2021   HGB 13.3 07/04/2021   HCT 41.5 07/04/2021   MCV 92 07/04/2021   PLT 326 07/04/2021   Lab Results  Component Value Date   NA 143 07/04/2021   K 4.1 07/04/2021   CO2 21 07/04/2021   GLUCOSE 92 07/04/2021   BUN 9 07/04/2021   CREATININE 0.74 07/04/2021   BILITOT 0.3 07/04/2021   ALKPHOS 78 07/04/2021   AST 19 07/04/2021   ALT 10 07/04/2021   PROT 6.3 07/04/2021   ALBUMIN 4.1 07/04/2021   CALCIUM 9.6 07/04/2021   EGFR 92 07/04/2021   Lab Results  Component Value Date   CHOL 216 (H) 08/30/2021   Lab Results  Component Value Date   HDL 66 08/30/2021   Lab Results  Component Value Date   LDLCALC 133 (H) 08/30/2021   Lab Results  Component Value Date   TRIG 98 08/30/2021   Lab Results  Component Value Date   CHOLHDL 3.3 08/30/2021   No results found for: HGBA1C    Assessment & Plan:   Problem List Items Addressed This Visit       Cardiovascular and Mediastinum   Primary hypertension - Primary   Relevant Orders   CBC with Differential/Platelet   Comprehensive metabolic panel Continue current meds     Endocrine   Other specified hypothyroidism   Relevant Orders   TSH Continue meds     Other   Mixed hyperlipidemia   Relevant Orders   Lipid panel Continue meds Watch diet    Other Visit Diagnoses  Vitamin D deficiency       Relevant Orders   VITAMIN D 25 Hydroxy (Vit-D Deficiency, Fractures) Continue meds       No orders of the defined types were placed in this encounter.   Follow-up: Return in about 6 months (around  04/15/2022) for chronic fasting follow up.    SARA R Mekhi Sonn, [name]

## 2021-10-17 LAB — COMPREHENSIVE METABOLIC PANEL
ALT: 13 IU/L (ref 0–32)
AST: 18 IU/L (ref 0–40)
Albumin/Globulin Ratio: 2 (ref 1.2–2.2)
Albumin: 4.3 g/dL (ref 3.8–4.8)
Alkaline Phosphatase: 86 IU/L (ref 44–121)
BUN/Creatinine Ratio: 10 — ABNORMAL LOW (ref 12–28)
BUN: 7 mg/dL — ABNORMAL LOW (ref 8–27)
Bilirubin Total: <0.2 mg/dL (ref 0.0–1.2)
CO2: 22 mmol/L (ref 20–29)
Calcium: 9.8 mg/dL (ref 8.7–10.3)
Chloride: 108 mmol/L — ABNORMAL HIGH (ref 96–106)
Creatinine, Ser: 0.7 mg/dL (ref 0.57–1.00)
Globulin, Total: 2.1 g/dL (ref 1.5–4.5)
Glucose: 94 mg/dL (ref 70–99)
Potassium: 4.4 mmol/L (ref 3.5–5.2)
Sodium: 143 mmol/L (ref 134–144)
Total Protein: 6.4 g/dL (ref 6.0–8.5)
eGFR: 98 mL/min/{1.73_m2} (ref >59)

## 2021-10-17 LAB — CBC WITH DIFFERENTIAL/PLATELET
Basophils Absolute: 0.1 10*3/uL (ref 0.0–0.2)
Basos: 1 %
EOS (ABSOLUTE): 0.3 10*3/uL (ref 0.0–0.4)
Eos: 3 %
Hematocrit: 40.4 % (ref 34.0–46.6)
Hemoglobin: 13.9 g/dL (ref 11.1–15.9)
Immature Grans (Abs): 0.1 10*3/uL (ref 0.0–0.1)
Immature Granulocytes: 1 %
Lymphocytes Absolute: 4.3 10*3/uL — ABNORMAL HIGH (ref 0.7–3.1)
Lymphs: 37 %
MCH: 31.6 pg (ref 26.6–33.0)
MCHC: 34.4 g/dL (ref 31.5–35.7)
MCV: 92 fL (ref 79–97)
Monocytes Absolute: 0.8 10*3/uL (ref 0.1–0.9)
Monocytes: 7 %
Neutrophils Absolute: 5.9 10*3/uL (ref 1.4–7.0)
Neutrophils: 51 %
Platelets: 312 10*3/uL (ref 150–450)
RBC: 4.4 x10E6/uL (ref 3.77–5.28)
RDW: 13 % (ref 11.7–15.4)
WBC: 11.4 10*3/uL — ABNORMAL HIGH (ref 3.4–10.8)

## 2021-10-17 LAB — LIPID PANEL
Chol/HDL Ratio: 2.3 ratio (ref 0.0–4.4)
Cholesterol, Total: 150 mg/dL (ref 100–199)
HDL: 66 mg/dL (ref >39)
LDL Chol Calc (NIH): 64 mg/dL (ref 0–99)
Triglycerides: 112 mg/dL (ref 0–149)
VLDL Cholesterol Cal: 20 mg/dL (ref 5–40)

## 2021-10-17 LAB — CARDIOVASCULAR RISK ASSESSMENT

## 2021-10-17 LAB — VITAMIN D 25 HYDROXY (VIT D DEFICIENCY, FRACTURES): Vit D, 25-Hydroxy: 38.4 ng/mL (ref 30.0–100.0)

## 2021-10-17 LAB — TSH: TSH: 3.11 u[IU]/mL (ref 0.450–4.500)

## 2021-10-21 ENCOUNTER — Other Ambulatory Visit: Payer: Self-pay | Admitting: Physician Assistant

## 2021-10-29 ENCOUNTER — Other Ambulatory Visit: Payer: Self-pay | Admitting: Physician Assistant

## 2021-10-29 DIAGNOSIS — E782 Mixed hyperlipidemia: Secondary | ICD-10-CM

## 2021-11-05 ENCOUNTER — Encounter: Payer: Self-pay | Admitting: Nurse Practitioner

## 2021-11-05 ENCOUNTER — Ambulatory Visit: Payer: 59 | Admitting: Nurse Practitioner

## 2021-11-05 VITALS — BP 150/68 | HR 97 | Temp 97.5°F | Ht 65.0 in | Wt 138.0 lb

## 2021-11-05 DIAGNOSIS — R051 Acute cough: Secondary | ICD-10-CM | POA: Diagnosis not present

## 2021-11-05 DIAGNOSIS — R0602 Shortness of breath: Secondary | ICD-10-CM

## 2021-11-05 DIAGNOSIS — J441 Chronic obstructive pulmonary disease with (acute) exacerbation: Secondary | ICD-10-CM | POA: Diagnosis not present

## 2021-11-05 DIAGNOSIS — F17218 Nicotine dependence, cigarettes, with other nicotine-induced disorders: Secondary | ICD-10-CM

## 2021-11-05 LAB — POCT INFLUENZA A/B
Influenza A, POC: NEGATIVE
Influenza B, POC: NEGATIVE

## 2021-11-05 LAB — POC COVID19 BINAXNOW: SARS Coronavirus 2 Ag: NEGATIVE

## 2021-11-05 MED ORDER — IPRATROPIUM-ALBUTEROL 0.5-2.5 (3) MG/3ML IN SOLN: 3.0000 mL | Freq: Once | RESPIRATORY_TRACT | Status: DC

## 2021-11-05 MED ORDER — AZITHROMYCIN 250 MG PO TABS: ORAL_TABLET | ORAL | 0 refills | Status: AC

## 2021-11-05 MED ORDER — ALBUTEROL SULFATE HFA 108 (90 BASE) MCG/ACT IN AERS: 2.0000 | INHALATION_SPRAY | Freq: Four times a day (QID) | RESPIRATORY_TRACT | 2 refills | Status: DC | PRN

## 2021-11-05 MED ORDER — TRIAMCINOLONE ACETONIDE 40 MG/ML IJ SUSP
60.0000 mg | Freq: Once | INTRAMUSCULAR | Status: AC
  Administered 2021-11-05: 60 mg via INTRAMUSCULAR

## 2021-11-05 MED ORDER — PREDNISONE 20 MG PO TABS: 20.0000 mg | ORAL_TABLET | Freq: Every day | ORAL | 0 refills | Status: DC

## 2021-11-05 NOTE — Progress Notes (Signed)
Acute Office Visit  Subjective:    Patient ID: Lori Wise, female    DOB: [birth year], 60 y.o.   MRN: [id number]  Chief Complaint  Patient presents with   URI    HPI: Patient is in today for cough, wheezing, dyspnea, and generalized body aches. Onset of symptoms was 3-days ago. Treatment has included Tessalon perles and Albuterol nebulizer. She has COPD with current cigarette smoking. She has obtained seasonal flu and COVID-19 vaccines.    Past Medical History:  Diagnosis Date   Acute laryngopharyngitis 04/19/2020   Anxiety 04/19/2020   Atypical nevus 04/19/2020   Bronchitis 04/30/2020   Chronic obstructive pulmonary disease ([place]) 10/22/2020   Chronic obstructive pulmonary disease with (acute) lower respiratory infection ([place])    Encounter for immunization 04/19/2020   Essential (primary) hypertension    Gastroesophageal reflux disease 10/22/2020   Hormone replacement therapy (HRT) 10/22/2020   Migraine without aura, not intractable, without status migrainosus    Mixed hyperlipidemia    Other chest pain 03/21/2021   Other specified hypothyroidism 04/19/2020   Primary hypertension 03/21/2021   Right leg pain 07/23/2020   Sunburn of second degree     Past Surgical History:  Procedure Laterality Date   ABDOMINAL HYSTERECTOMY     CARDIAC CATHETERIZATION  2000   normal   CHOLECYSTECTOMY     OTHER SURGICAL HISTORY     Ear surgeries x5    Family History  Problem Relation Age of Onset   Suicidality Mother    Lung cancer Father    Cancer Brother        Brain Cancer   Cancer Sister        Cell-1   Lung cancer Brother     Social History   Socioeconomic History   Marital status: Legally Separated    Spouse name: Not on file   Number of children: 2   Years of education: Not on file   Highest education level: Not on file  Occupational History   Occupation: [employer]  Tobacco Use   Smoking status: Every Day    Packs/day: 1.00    Types: Cigarettes    Smokeless tobacco: Never  Vaping Use   Vaping Use: Never used  Substance and Sexual Activity   Alcohol use: Not Currently    Comment: Drinks approximately two times per week.   Drug use: Never   Sexual activity: Not on file  Other Topics Concern   Not on file  Social History Narrative   Not on file   Social Determinants of Health   Financial Resource Strain: Not on file  Food Insecurity: Not on file  Transportation Needs: Not on file  Physical Activity: Not on file  Stress: Not on file  Social Connections: Not on file  Intimate Partner Violence: Not on file    Outpatient Medications Prior to Visit  Medication Sig Dispense Refill   albuterol (VENTOLIN HFA) 108 (90 Base) MCG/ACT inhaler Inhale 2 puffs into the lungs every 6 (six) hours as needed for wheezing or shortness of breath. 8 g 2   ALPRAZolam (XANAX) 0.5 MG tablet TAKE ONE TABLET BY MOUTH 3 TIMES DAILY 90 tablet 0   Ascorbic Acid (VITAMIN C PO) Take 1 tablet by mouth daily. Unknown strength     aspirin 81 MG chewable tablet Chew 81 mg by mouth daily.     atenolol (TENORMIN) 25 MG tablet TAKE 2 TABLETS BY MOUTH EVERY DAY (Patient taking differently: Take 50 mg by mouth  daily. TAKE 2 TABLETS BY MOUTH EVERY DAY) 60 tablet 3   benzonatate (TESSALON) 200 MG capsule Take 1 capsule (200 mg total) by mouth 2 (two) times daily as needed for cough. 30 capsule 1   cetirizine (ZYRTEC) 10 MG tablet Take 1 tablet (10 mg total) by mouth daily. 30 tablet 11   Cyanocobalamin (VITAMIN B-12 PO) Take 1 tablet by mouth daily. Unknown strenght     escitalopram (LEXAPRO) 10 MG tablet Take 1 tablet (10 mg total) by mouth daily. 30 tablet 2   estradiol (ESTRACE) 0.5 MG tablet Take 1 tablet (0.5 mg total) by mouth daily. 90 tablet 1   ezetimibe (ZETIA) 10 MG tablet TAKE ONE TABLET BY MOUTH DAILY 30 tablet 1   ibuprofen (ADVIL) 800 MG tablet TAKE ONE TABLET BY MOUTH EVERY 8 HOURS AS NEEDED FOR PAIN 60 tablet 2   ipratropium-albuterol (DUONEB)  0.5-2.5 (3) MG/3ML SOLN USE 1 VIAL IN NEBULIZER EVERY 4 TO 6 HOURS AS NEEDED (Patient taking differently: Inhale 3 mLs into the lungs every 4 (four) hours as needed (SOB). USE 1 VIAL IN NEBULIZER EVERY 4 TO 6 HOURS AS NEEDED) 360 mL 0   levothyroxine (SYNTHROID) 25 MCG tablet TAKE ONE TABLET BY MOUTH EVERY DAY 30 tablet 1   Omega-3 1000 MG CAPS Take 1 capsule by mouth daily.     omeprazole (PRILOSEC) 20 MG capsule TAKE ONE CAPSULE BY MOUTH EVERY DAY 30 capsule 1   rosuvastatin (CRESTOR) 5 MG tablet Take 1 tablet (5 mg total) by mouth daily. 90 tablet 3   SYMBICORT 160-4.5 MCG/ACT inhaler Inhale 2 puffs into the lungs 2 (two) times daily. (Patient taking differently: Inhale 2 puffs into the lungs in the morning and at bedtime.) 10.2 g 3   Vitamin D, Ergocalciferol, (DRISDOL) 1.25 MG (50000 UNIT) CAPS capsule Take 50,000 Units by mouth every 7 (seven) days.     VITAMIN E PO Take 1 tablet by mouth daily at 6 (six) AM. Unknown strength     No facility-administered medications prior to visit.    Allergies  Allergen Reactions   Codeine Hives, Itching, Other (See Comments), Rash and Nausea And Vomiting    Review of Systems  Constitutional:  Positive for appetite change (decreased), chills, fatigue and fever.  HENT:  Negative for congestion, ear pain, postnasal drip, rhinorrhea and sore throat.   Eyes: Negative.   Respiratory:  Positive for cough, chest tightness, shortness of breath and wheezing.   Cardiovascular:  Negative for chest pain.  Gastrointestinal:  Positive for abdominal pain (From coughing). Negative for diarrhea and nausea.  Endocrine: Negative.   Genitourinary: Negative.   Musculoskeletal: Negative.   Skin: Negative.   Allergic/Immunologic: Negative.   Neurological:  Positive for headaches. Negative for dizziness.  Hematological: Negative.   Psychiatric/Behavioral: Negative.        Objective:    Physical Exam Vitals reviewed.  Constitutional:      Appearance: She is  ill-appearing.  HENT:     Nose: No congestion or rhinorrhea.     Mouth/Throat:     Pharynx: No posterior oropharyngeal erythema.  Cardiovascular:     Rate and Rhythm: Normal rate and regular rhythm.     Heart sounds: Normal heart sounds.  Pulmonary:     Breath sounds: Wheezing and rhonchi present.     Comments: Mild Tachypnea noted  Skin:    General: Skin is warm and dry.     Capillary Refill: Capillary refill takes less than 2 seconds.  Neurological:  General: No focal deficit present.     Mental Status: She is oriented to person, place, and time.  Psychiatric:        Mood and Affect: Mood normal.        Behavior: Behavior normal.    BP (!) 150/68   Pulse 97   Temp (!) 97.5 F (36.4 C)   Ht _0  (1.651 m)   Wt 138 lb (62.6 kg)   SpO2 92%   BMI 22.96 kg/m   Wt Readings from Last 3 Encounters:  10/16/21 135 lb 9.6 oz (61.5 kg)  08/30/21 134 lb 3.2 oz (60.9 kg)  07/04/21 130 lb 6.4 oz (59.1 kg)    Health Maintenance Due  Topic Date Due   Pneumococcal Vaccine 17-21 Years old (1 - PCV) Never done   PAP SMEAR-Modifier  Never done       Lab Results  Component Value Date   TSH 3.110 10/16/2021   Lab Results  Component Value Date   WBC 11.4 (H) 10/16/2021   HGB 13.9 10/16/2021   HCT 40.4 10/16/2021   MCV 92 10/16/2021   PLT 312 10/16/2021   Lab Results  Component Value Date   NA 143 10/16/2021   K 4.4 10/16/2021   CO2 22 10/16/2021   GLUCOSE 94 10/16/2021   BUN 7 (L) 10/16/2021   CREATININE 0.70 10/16/2021   BILITOT <0.2 10/16/2021   ALKPHOS 86 10/16/2021   AST 18 10/16/2021   ALT 13 10/16/2021   PROT 6.4 10/16/2021   ALBUMIN 4.3 10/16/2021   CALCIUM 9.8 10/16/2021   EGFR 98 10/16/2021   Lab Results  Component Value Date   CHOL 150 10/16/2021   Lab Results  Component Value Date   HDL 66 10/16/2021   Lab Results  Component Value Date   LDLCALC 64 10/16/2021   Lab Results  Component Value Date   TRIG 112 10/16/2021   Lab Results   Component Value Date   CHOLHDL 2.3 10/16/2021        Assessment & Plan:   1. COPD with acute exacerbation (HCC) - albuterol (VENTOLIN HFA) 108 (90 Base) MCG/ACT inhaler; Inhale 2 puffs into the lungs every 6 (six) hours as needed for wheezing or shortness of breath.  Dispense: 8 g; Refill: 2 - ipratropium-albuterol (DUONEB) 0.5-2.5 (3) MG/3ML nebulizer solution 3 mL - triamcinolone acetonide (KENALOG-40) injection 60 mg - azithromycin (ZITHROMAX) 250 MG tablet; Take 2 tablets on day 1, then 1 tablet daily on days 2 through 5  Dispense: 6 tablet; Refill: 0 - predniSONE (DELTASONE) 20 MG tablet; Take 1 tablet (20 mg total) by mouth daily with breakfast. 1 po tid for 3 days then 1 po bid for 3 days then 1 po qd for 3 days  Dispense: 18 tablet; Refill: 0 - DG Chest 2 View  2. Acute cough - POCT Influenza A/B-NEGATIVE - POC COVID-19 BinaxNow-NEGATIVE - DG Chest 2 View-NEGATIVE  3. Shortness of breath - albuterol (VENTOLIN HFA) 108 (90 Base) MCG/ACT inhaler; Inhale 2 puffs into the lungs every 6 (six) hours as needed for wheezing or shortness of breath.  Dispense: 8 g; Refill: 2 - ipratropium-albuterol (DUONEB) 0.5-2.5 (3) MG/3ML nebulizer solution 3 mL - DG Chest 2 View  4. Cigarette nicotine dependence with other nicotine-induced disorder  -recommend smoking cessation    Obtain chest x-ray at [place] as prescribed Take prednisone as prescribed Kenalog steroid injection given in office Use Albuterol inhaler and nebulizer as needed Seek emergency medical  care for sever shortness of breath or any other concerning symptoms Recommend quitting smoking Follow-up as needed     Follow-up: PRN  An After Visit Summary was printed and given to the patient.  I, Rip Harbour, NP, have reviewed all documentation for this visit. The documentation on 11/05/21 for the exam, diagnosis, procedures, and orders are all accurate and complete.    Signed, Rip Harbour, NP [place] [phone]

## 2021-11-05 NOTE — Patient Instructions (Addendum)
Obtain chest x-ray at [place] as prescribed Take prednisone as prescribed Kenalog steroid injection given in office Use Albuterol inhaler and nebulizer as needed Seek emergency medical care for sever shortness of breath or any other concerning symptoms Recommend quitting smoking Follow-up as needed   Chronic Obstructive Pulmonary Disease Exacerbation Chronic obstructive pulmonary disease (COPD) is a long-term (chronic) lung problem. In COPD, the flow of air from the lungs is limited. COPD exacerbations are times that breathing gets worse and you need more than your normal treatment. Without treatment, they can be life-threatening. If they happen often, your lungs can become more damaged. What are the causes? Having infections that affect your airways and lungs. Being exposed to: Smoke. Air pollution. Chemical fumes. Dust. Things that can cause an allergic reaction (allergens). Not taking your usual COPD medicines as told. Having medical problems already, such as heart failure or infections not involving the lungs. In many cases, the cause is not known. What increases the risk? Smoking. Being an older adult. Having frequent prior COPD exacerbations. What are the signs or symptoms? Increased coughing. Increased mucus from your lungs. Increased wheezing. Increased shortness of breath. Fast breathing and finding it hard to breathe. Chest tightness. Less energy than usual. Sleep disruption from symptoms. Confusion. Increased sleepiness. Often, these symptoms happen or get worse even with the use of medicines. How is this treated? Treatment for this condition depends on how bad it is and the cause of the symptoms. You may need to stay in the hospital for treatment. Treatment may include: Taking medicines. Using oxygen. Being treated with different ways to clear your airway, such as using a mask to deliver oxygen. Follow these instructions at  home: Medicines Take over-the-counter and prescription medicines only as told by your doctor. Use all inhaled medicines the correct way. If you were prescribed an antibiotic or steroid medicine, take it as told by your doctor. Do not stop taking it even if you start to feel better. Lifestyle Do not smoke or use any products that contain nicotine or tobacco. If you need help quitting, ask your doctor. Eat healthy foods. Exercise regularly. Get enough sleep. Most adults need 7 or more hours per night. Avoid tobacco smoke and other things that can bother your lungs. Several times a day, wash your hands with soap and water for at least 20 seconds. If you cannot use soap and water, use hand sanitizer. This may help keep you from getting an infection. During flu season, avoid areas that are crowded with people. General instructions Drink enough fluid to keep your pee (urine) pale yellow. Do not do this if your doctor has told you not to. Use a cool mist machine (vaporizer). If you use oxygen or a machine that turns medicine into a mist (nebulizer), continue to use it as told. Keep all follow-up visits. How is this prevented? Keep up with shots (vaccinations) as told by your doctor. Be sure to get a yearly flu (influenza) shot. If you smoke, quit smoking. Smoking makes the problem worse. Follow all instructions for rehabilitation. These are steps you can take to make your body work better. Work with your doctor to develop and follow an action plan. This tells you what steps to take when you experience certain symptoms. Contact a doctor if: Your COPD symptoms get worse than normal. Get help right away if: You are short of breath and it gets worse, even when you are resting. You have trouble talking. You have chest pain.  You cough up blood. You have a fever. You keep vomiting. You feel weak or you pass out (faint). You feel confused. You are not able to sleep because of your symptoms. You  have trouble doing daily activities. These symptoms may be an emergency. Get help right away. Call your local emergency services (911 in the [place] Do not wait to see if the symptoms will go away. Do not drive yourself to the hospital. Summary COPD exacerbations are times that breathing gets worse and you need more treatment than normal. COPD exacerbations can be very serious and may cause your lungs to become more damaged. Do not smoke. If you need help quitting, ask your doctor. Stay up to date on your shots. Get a flu shot every year. This information is not intended to replace advice given to you by your health care provider. Make sure you discuss any questions you have with your health care provider. Document Revised: 10/17/2020 Document Reviewed: 10/02/2020 Elsevier Patient Education  2022 [employer].

## 2021-11-12 ENCOUNTER — Other Ambulatory Visit: Payer: Self-pay | Admitting: Family Medicine

## 2021-11-13 ENCOUNTER — Other Ambulatory Visit: Payer: Self-pay

## 2021-11-13 ENCOUNTER — Ambulatory Visit: Payer: 59 | Admitting: Physician Assistant

## 2021-11-13 ENCOUNTER — Encounter: Payer: Self-pay | Admitting: Physician Assistant

## 2021-11-13 VITALS — BP 110/64 | HR 73 | Temp 97.1°F | Ht 65.0 in | Wt 131.4 lb

## 2021-11-13 DIAGNOSIS — J440 Chronic obstructive pulmonary disease with acute lower respiratory infection: Secondary | ICD-10-CM

## 2021-11-13 MED ORDER — LEVOFLOXACIN 500 MG PO TABS: 500.0000 mg | ORAL_TABLET | Freq: Every day | ORAL | 0 refills | Status: AC

## 2021-11-13 MED ORDER — BUDESONIDE-FORMOTEROL FUMARATE 160-4.5 MCG/ACT IN AERO: 2.0000 | INHALATION_SPRAY | Freq: Two times a day (BID) | RESPIRATORY_TRACT | 3 refills | Status: DC

## 2021-11-13 MED ORDER — FLUCONAZOLE 150 MG PO TABS: 150.0000 mg | ORAL_TABLET | Freq: Every day | ORAL | 1 refills | Status: DC

## 2021-11-13 NOTE — Progress Notes (Signed)
Acute Office Visit  Subjective:    Patient ID: Lori Wise, female    DOB: September 23, 1961, 60 y.o.   MRN: [id number]  Chief Complaint  Patient presents with   Follow-up    HPI: Patient is in today for recheck of COPD with exacerbation - she was seen on 11/29 and treated with Kenalog, prednisone, zpack and she is using albuterol inhaler and nebulizer However symbicort was stopped and unsure why She has had some improvement but still with malaise and some wheezing and chest congestion  Past Medical History:  Diagnosis Date   Acute laryngopharyngitis 04/19/2020   Anxiety 04/19/2020   Atypical nevus 04/19/2020   Bronchitis 04/30/2020   Chronic obstructive pulmonary disease ([place]) 10/22/2020   Chronic obstructive pulmonary disease with (acute) lower respiratory infection ([place])    Encounter for immunization 04/19/2020   Essential (primary) hypertension    Gastroesophageal reflux disease 10/22/2020   Hormone replacement therapy (HRT) 10/22/2020   Migraine without aura, not intractable, without status migrainosus    Mixed hyperlipidemia    Other chest pain 03/21/2021   Other specified hypothyroidism 04/19/2020   Primary hypertension 03/21/2021   Right leg pain 07/23/2020   Sunburn of second degree     Past Surgical History:  Procedure Laterality Date   ABDOMINAL HYSTERECTOMY     CARDIAC CATHETERIZATION  2000   normal   CHOLECYSTECTOMY     OTHER SURGICAL HISTORY     Ear surgeries x5    Family History  Problem Relation Age of Onset   Suicidality Mother    Lung cancer Father    Cancer Brother        Brain Cancer   Cancer Sister        Cell-1   Lung cancer Brother     Social History   Socioeconomic History   Marital status: Legally Separated    Spouse name: Not on file   Number of children: 2   Years of education: Not on file   Highest education level: Not on file  Occupational History   Occupation: [employer]  Tobacco Use   Smoking status: Every Day     Packs/day: 1.00    Types: Cigarettes   Smokeless tobacco: Never  Vaping Use   Vaping Use: Never used  Substance and Sexual Activity   Alcohol use: Not Currently    Comment: Drinks approximately two times per week.   Drug use: Never   Sexual activity: Not on file  Other Topics Concern   Not on file  Social History Narrative   Not on file   Social Determinants of Health   Financial Resource Strain: Not on file  Food Insecurity: Not on file  Transportation Needs: Not on file  Physical Activity: Not on file  Stress: Not on file  Social Connections: Not on file  Intimate Partner Violence: Not on file    Outpatient Medications Prior to Visit  Medication Sig Dispense Refill   albuterol (VENTOLIN HFA) 108 (90 Base) MCG/ACT inhaler Inhale 2 puffs into the lungs every 6 (six) hours as needed for wheezing or shortness of breath. 8 g 2   ALPRAZolam (XANAX) 0.5 MG tablet TAKE ONE TABLET BY MOUTH 3 TIMES DAILY 90 tablet 0   Ascorbic Acid (VITAMIN C PO) Take 1 tablet by mouth daily. Unknown strength     aspirin 81 MG chewable tablet Chew 81 mg by mouth daily.     atenolol (TENORMIN) 25 MG tablet TAKE 2 TABLETS BY MOUTH EVERY  DAY (Patient taking differently: Take 50 mg by mouth daily. TAKE 2 TABLETS BY MOUTH EVERY DAY) 60 tablet 3   benzonatate (TESSALON) 200 MG capsule Take 1 capsule (200 mg total) by mouth 2 (two) times daily as needed for cough. 30 capsule 1   cetirizine (ZYRTEC) 10 MG tablet Take 1 tablet (10 mg total) by mouth daily. 30 tablet 11   Cyanocobalamin (VITAMIN B-12 PO) Take 1 tablet by mouth daily. Unknown strenght     escitalopram (LEXAPRO) 10 MG tablet Take 1 tablet (10 mg total) by mouth daily. 30 tablet 2   estradiol (ESTRACE) 0.5 MG tablet Take 1 tablet (0.5 mg total) by mouth daily. 90 tablet 1   ezetimibe (ZETIA) 10 MG tablet TAKE ONE TABLET BY MOUTH DAILY 30 tablet 1   ibuprofen (ADVIL) 800 MG tablet TAKE ONE TABLET BY MOUTH EVERY 8 HOURS AS NEEDED FOR PAIN 60 tablet 2    ipratropium-albuterol (DUONEB) 0.5-2.5 (3) MG/3ML SOLN USE 1 VIAL IN NEBULIZER EVERY 4 TO 6 HOURS AS NEEDED (Patient taking differently: Inhale 3 mLs into the lungs every 4 (four) hours as needed (SOB). USE 1 VIAL IN NEBULIZER EVERY 4 TO 6 HOURS AS NEEDED) 360 mL 0   levothyroxine (SYNTHROID) 25 MCG tablet TAKE ONE TABLET BY MOUTH EVERY DAY 30 tablet 1   Omega-3 1000 MG CAPS Take 1 capsule by mouth daily.     omeprazole (PRILOSEC) 20 MG capsule TAKE ONE CAPSULE BY MOUTH EVERY DAY 30 capsule 1   predniSONE (DELTASONE) 20 MG tablet Take 1 tablet (20 mg total) by mouth daily with breakfast. 1 po tid for 3 days then 1 po bid for 3 days then 1 po qd for 3 days 18 tablet 0   rosuvastatin (CRESTOR) 5 MG tablet Take 1 tablet (5 mg total) by mouth daily. 90 tablet 3   Vitamin D, Ergocalciferol, (DRISDOL) 1.25 MG (50000 UNIT) CAPS capsule Take 50,000 Units by mouth every 7 (seven) days.     VITAMIN E PO Take 1 tablet by mouth daily at 6 (six) AM. Unknown strength     SYMBICORT 160-4.5 MCG/ACT inhaler Inhale 2 puffs into the lungs 2 (two) times daily. (Patient taking differently: Inhale 2 puffs into the lungs in the morning and at bedtime.) 10.2 g 3   Facility-Administered Medications Prior to Visit  Medication Dose Route Frequency Provider Last Rate Last Admin   ipratropium-albuterol (DUONEB) 0.5-2.5 (3) MG/3ML nebulizer solution 3 mL  3 mL Nebulization Once Rip Harbour, NP        Allergies  Allergen Reactions   Codeine Hives, Itching, Other (See Comments), Rash and Nausea And Vomiting    Review of Systems CONSTITUTIONAL: see HPI E/N/T: see HPI CARDIOVASCULAR: Negative for chest pain, dizziness, palpitations and pedal edema.  RESPIRATORY: see HPI GASTROINTESTINAL: Negative for abdominal pain, acid reflux symptoms, constipation, diarrhea, nausea and vomiting.  MSK: Negative for arthralgias and myalgias.  INTEGUMENTARY: Negative for rash.          Objective:    Physical Exam PHYSICAL  EXAM:   VS: BP 110/64 (BP Location: Left Arm, Patient Position: Sitting, Cuff Size: Normal)   Pulse 73   Temp (!) 97.1 F (36.2 C) (Temporal)   Ht '5\' 5"'  (1.651 m)   Wt 131 lb 6.4 oz (59.6 kg)   SpO2 98%   BMI 21.87 kg/m   GEN: Well nourished, well developed, in no acute distress  HEENT: normal external ears and nose - normal external auditory canals and  TMS - - Lips, Teeth and Gums - normal  Oropharynx - erythema/pnd Cardiac: RRR; no murmurs, rubs, or gallops,no edema -  Respiratory: scattered exp rhonchi noted MS: no deformity or atrophy  Skin: warm and dry, no rash    BP 110/64 (BP Location: Left Arm, Patient Position: Sitting, Cuff Size: Normal)   Pulse 73   Temp (!) 97.1 F (36.2 C) (Temporal)   Ht '5\' 5"'  (1.651 m)   Wt 131 lb 6.4 oz (59.6 kg)   SpO2 98%   BMI 21.87 kg/m  Wt Readings from Last 3 Encounters:  11/13/21 131 lb 6.4 oz (59.6 kg)  11/05/21 138 lb (62.6 kg)  10/16/21 135 lb 9.6 oz (61.5 kg)    Health Maintenance Due  Topic Date Due   Pneumococcal Vaccine 57-33 Years old (1 - PCV) Never done   PAP SMEAR-Modifier  Never done    There are no preventive care reminders to display for this patient.   Lab Results  Component Value Date   TSH 3.110 10/16/2021   Lab Results  Component Value Date   WBC 11.4 (H) 10/16/2021   HGB 13.9 10/16/2021   HCT 40.4 10/16/2021   MCV 92 10/16/2021   PLT 312 10/16/2021   Lab Results  Component Value Date   NA 143 10/16/2021   K 4.4 10/16/2021   CO2 22 10/16/2021   GLUCOSE 94 10/16/2021   BUN 7 (L) 10/16/2021   CREATININE 0.70 10/16/2021   BILITOT <0.2 10/16/2021   ALKPHOS 86 10/16/2021   AST 18 10/16/2021   ALT 13 10/16/2021   PROT 6.4 10/16/2021   ALBUMIN 4.3 10/16/2021   CALCIUM 9.8 10/16/2021   EGFR 98 10/16/2021   Lab Results  Component Value Date   CHOL 150 10/16/2021   Lab Results  Component Value Date   HDL 66 10/16/2021   Lab Results  Component Value Date   LDLCALC 64 10/16/2021   Lab  Results  Component Value Date   TRIG 112 10/16/2021   Lab Results  Component Value Date   CHOLHDL 2.3 10/16/2021   No results found for: HGBA1C     Assessment & Plan:   Problem List Items Addressed This Visit       Respiratory   Chronic obstructive pulmonary disease with (acute) lower respiratory infection ([place]) - Primary   Relevant Medications   fluconazole (DIFLUCAN) 150 MG tablet   levofloxacin (LEVAQUIN) 500 MG tablet   budesonide-formoterol (SYMBICORT) 160-4.5 MCG/ACT inhaler   Other Relevant Orders   CBC with Differential/Platelet   Comprehensive metabolic panel   Meds ordered this encounter  Medications   fluconazole (DIFLUCAN) 150 MG tablet    Sig: Take 1 tablet (150 mg total) by mouth daily.    Dispense:  1 tablet    Refill:  1    Order Specific Question:   Supervising Provider    Answer:   [name]   levofloxacin (LEVAQUIN) 500 MG tablet    Sig: Take 1 tablet (500 mg total) by mouth daily for 10 days.    Dispense:  10 tablet    Refill:  0    Order Specific Question:   Supervising Provider    Answer:   [name]   budesonide-formoterol (SYMBICORT) 160-4.5 MCG/ACT inhaler    Sig: Inhale 2 puffs into the lungs 2 (two) times daily.    Dispense:  10.2 g    Refill:  3    Order Specific Question:   Supervising Provider  AnswerRochel Brome [[id number]]    Orders Placed This Encounter  Procedures   CBC with Differential/Platelet   Comprehensive metabolic panel     Follow-up: Return if symptoms worsen or fail to improve.  An After Visit Summary was printed and given to the patient.  Yetta Flock [facility] [phone]

## 2021-11-14 ENCOUNTER — Ambulatory Visit (INDEPENDENT_AMBULATORY_CARE_PROVIDER_SITE_OTHER): Payer: 59

## 2021-11-14 DIAGNOSIS — J441 Chronic obstructive pulmonary disease with (acute) exacerbation: Secondary | ICD-10-CM | POA: Diagnosis not present

## 2021-11-14 LAB — COMPREHENSIVE METABOLIC PANEL
ALT: 30 IU/L (ref 0–32)
AST: 19 IU/L (ref 0–40)
Albumin/Globulin Ratio: 2 (ref 1.2–2.2)
Albumin: 4.3 g/dL (ref 3.8–4.8)
Alkaline Phosphatase: 82 IU/L (ref 44–121)
BUN/Creatinine Ratio: 25 (ref 12–28)
BUN: 21 mg/dL (ref 8–27)
Bilirubin Total: 0.3 mg/dL (ref 0.0–1.2)
CO2: 23 mmol/L (ref 20–29)
Calcium: 9.5 mg/dL (ref 8.7–10.3)
Chloride: 106 mmol/L (ref 96–106)
Creatinine, Ser: 0.85 mg/dL (ref 0.57–1.00)
Globulin, Total: 2.2 g/dL (ref 1.5–4.5)
Glucose: 89 mg/dL (ref 70–99)
Potassium: 3.9 mmol/L (ref 3.5–5.2)
Sodium: 140 mmol/L (ref 134–144)
Total Protein: 6.5 g/dL (ref 6.0–8.5)
eGFR: 78 mL/min/{1.73_m2} (ref >59)

## 2021-11-14 LAB — CBC WITH DIFFERENTIAL/PLATELET
Basophils Absolute: 0.1 10*3/uL (ref 0.0–0.2)
Basos: 1 %
EOS (ABSOLUTE): 0.2 10*3/uL (ref 0.0–0.4)
Eos: 1 %
Hematocrit: 41.5 % (ref 34.0–46.6)
Hemoglobin: 14 g/dL (ref 11.1–15.9)
Immature Grans (Abs): 0.6 10*3/uL — ABNORMAL HIGH (ref 0.0–0.1)
Immature Granulocytes: 3 %
Lymphocytes Absolute: 9.7 10*3/uL — ABNORMAL HIGH (ref 0.7–3.1)
Lymphs: 47 %
MCH: 30.7 pg (ref 26.6–33.0)
MCHC: 33.7 g/dL (ref 31.5–35.7)
MCV: 91 fL (ref 79–97)
Monocytes Absolute: 1.3 10*3/uL — ABNORMAL HIGH (ref 0.1–0.9)
Monocytes: 7 %
Neutrophils Absolute: 8.4 10*3/uL — ABNORMAL HIGH (ref 1.4–7.0)
Neutrophils: 41 %
Platelets: 505 10*3/uL — ABNORMAL HIGH (ref 150–450)
RBC: 4.56 x10E6/uL (ref 3.77–5.28)
RDW: 13.2 % (ref 11.7–15.4)
WBC: 20.3 10*3/uL (ref 3.4–10.8)

## 2021-11-14 MED ORDER — CEFTRIAXONE SODIUM 1 G IJ SOLR
1.0000 g | Freq: Once | INTRAMUSCULAR | Status: AC
Start: 2021-11-14 — End: 2021-11-14
  Administered 2021-11-14: 1 g via INTRAMUSCULAR

## 2021-11-14 NOTE — Progress Notes (Signed)
Patient came in for Rocephin 1gm per provider request.

## 2021-11-18 ENCOUNTER — Other Ambulatory Visit: Payer: Self-pay

## 2021-11-18 ENCOUNTER — Ambulatory Visit: Payer: 59 | Admitting: Physician Assistant

## 2021-11-18 ENCOUNTER — Encounter: Payer: Self-pay | Admitting: Physician Assistant

## 2021-11-18 VITALS — BP 118/68 | HR 85 | Temp 97.4°F | Ht 65.0 in | Wt 134.6 lb

## 2021-11-18 DIAGNOSIS — J440 Chronic obstructive pulmonary disease with acute lower respiratory infection: Secondary | ICD-10-CM | POA: Diagnosis not present

## 2021-11-18 NOTE — Progress Notes (Signed)
Acute Office Visit  Subjective:    Patient ID: Lori Wise, female    DOB: 04/21/1961, 60 y.o.   MRN: [id number]  Chief Complaint  Patient presents with   Follow-up    1 Week     HPI: Patient is in today for follow up of COPD with exacerbation - states overall she is feeling some better but still fatigued - breathing is better She is using her inhalers and neb treatments as directed Received rocephin 1gm IM on Friday and started on Levaquin Also on prednisone  Past Medical History:  Diagnosis Date   Acute laryngopharyngitis 04/19/2020   Anxiety 04/19/2020   Atypical nevus 04/19/2020   Bronchitis 04/30/2020   Chronic obstructive pulmonary disease ([place]) 10/22/2020   Chronic obstructive pulmonary disease with (acute) lower respiratory infection ([place])    Encounter for immunization 04/19/2020   Essential (primary) hypertension    Gastroesophageal reflux disease 10/22/2020   Hormone replacement therapy (HRT) 10/22/2020   Migraine without aura, not intractable, without status migrainosus    Mixed hyperlipidemia    Other chest pain 03/21/2021   Other specified hypothyroidism 04/19/2020   Primary hypertension 03/21/2021   Right leg pain 07/23/2020   Sunburn of second degree     Past Surgical History:  Procedure Laterality Date   ABDOMINAL HYSTERECTOMY     CARDIAC CATHETERIZATION  2000   normal   CHOLECYSTECTOMY     OTHER SURGICAL HISTORY     Ear surgeries x5    Family History  Problem Relation Age of Onset   Suicidality Mother    Lung cancer Father    Cancer Brother        Brain Cancer   Cancer Sister        Cell-1   Lung cancer Brother     Social History   Socioeconomic History   Marital status: Legally Separated    Spouse name: Not on file   Number of children: 2   Years of education: Not on file   Highest education level: Not on file  Occupational History   Occupation: [employer]  Tobacco Use   Smoking status: Every Day    Packs/day: 1.00    Types:  Cigarettes   Smokeless tobacco: Never  Vaping Use   Vaping Use: Never used  Substance and Sexual Activity   Alcohol use: Not Currently    Comment: Drinks approximately two times per week.   Drug use: Never   Sexual activity: Not on file  Other Topics Concern   Not on file  Social History Narrative   Not on file   Social Determinants of Health   Financial Resource Strain: Not on file  Food Insecurity: Not on file  Transportation Needs: Not on file  Physical Activity: Not on file  Stress: Not on file  Social Connections: Not on file  Intimate Partner Violence: Not on file    Outpatient Medications Prior to Visit  Medication Sig Dispense Refill   albuterol (VENTOLIN HFA) 108 (90 Base) MCG/ACT inhaler Inhale 2 puffs into the lungs every 6 (six) hours as needed for wheezing or shortness of breath. 8 g 2   ALPRAZolam (XANAX) 0.5 MG tablet TAKE ONE TABLET BY MOUTH 3 TIMES DAILY 90 tablet 0   Ascorbic Acid (VITAMIN C PO) Take 1 tablet by mouth daily. Unknown strength     aspirin 81 MG chewable tablet Chew 81 mg by mouth daily.     atenolol (TENORMIN) 25 MG tablet TAKE 2 TABLETS  BY MOUTH EVERY DAY (Patient taking differently: Take 50 mg by mouth daily. TAKE 2 TABLETS BY MOUTH EVERY DAY) 60 tablet 3   benzonatate (TESSALON) 200 MG capsule Take 1 capsule (200 mg total) by mouth 2 (two) times daily as needed for cough. 30 capsule 1   budesonide-formoterol (SYMBICORT) 160-4.5 MCG/ACT inhaler Inhale 2 puffs into the lungs 2 (two) times daily. 10.2 g 3   cetirizine (ZYRTEC) 10 MG tablet Take 1 tablet (10 mg total) by mouth daily. 30 tablet 11   Cyanocobalamin (VITAMIN B-12 PO) Take 1 tablet by mouth daily. Unknown strenght     escitalopram (LEXAPRO) 10 MG tablet Take 1 tablet (10 mg total) by mouth daily. 30 tablet 2   estradiol (ESTRACE) 0.5 MG tablet Take 1 tablet (0.5 mg total) by mouth daily. 90 tablet 1   ezetimibe (ZETIA) 10 MG tablet TAKE ONE TABLET BY MOUTH DAILY 30 tablet 1    fluconazole (DIFLUCAN) 150 MG tablet Take 1 tablet (150 mg total) by mouth daily. 1 tablet 1   ibuprofen (ADVIL) 800 MG tablet TAKE ONE TABLET BY MOUTH EVERY 8 HOURS AS NEEDED FOR PAIN 60 tablet 2   ipratropium-albuterol (DUONEB) 0.5-2.5 (3) MG/3ML SOLN USE 1 VIAL IN NEBULIZER EVERY 4 TO 6 HOURS AS NEEDED (Patient taking differently: Inhale 3 mLs into the lungs every 4 (four) hours as needed (SOB). USE 1 VIAL IN NEBULIZER EVERY 4 TO 6 HOURS AS NEEDED) 360 mL 0   levofloxacin (LEVAQUIN) 500 MG tablet Take 1 tablet (500 mg total) by mouth daily for 10 days. 10 tablet 0   levothyroxine (SYNTHROID) 25 MCG tablet TAKE ONE TABLET BY MOUTH EVERY DAY 30 tablet 1   Omega-3 1000 MG CAPS Take 1 capsule by mouth daily.     omeprazole (PRILOSEC) 20 MG capsule TAKE ONE CAPSULE BY MOUTH EVERY DAY 30 capsule 1   predniSONE (DELTASONE) 20 MG tablet Take 1 tablet (20 mg total) by mouth daily with breakfast. 1 po tid for 3 days then 1 po bid for 3 days then 1 po qd for 3 days 18 tablet 0   rosuvastatin (CRESTOR) 5 MG tablet Take 1 tablet (5 mg total) by mouth daily. 90 tablet 3   Vitamin D, Ergocalciferol, (DRISDOL) 1.25 MG (50000 UNIT) CAPS capsule Take 50,000 Units by mouth every 7 (seven) days.     VITAMIN E PO Take 1 tablet by mouth daily at 6 (six) AM. Unknown strength     Facility-Administered Medications Prior to Visit  Medication Dose Route Frequency Provider Last Rate Last Admin   ipratropium-albuterol (DUONEB) 0.5-2.5 (3) MG/3ML nebulizer solution 3 mL  3 mL Nebulization Once Rip Harbour, NP        Allergies  Allergen Reactions   Codeine Hives, Itching, Other (See Comments), Rash and Nausea And Vomiting    Review of Systems CONSTITUTIONAL: has fatigue E/N/T:see HPI CARDIOVASCULAR: Negative for chest pain, dizziness, palpitations and pedal edema.  RESPIRATORY: see HPI GASTROINTESTINAL: Negative for abdominal pain, acid reflux symptoms, constipation, diarrhea, nausea and vomiting.   INTEGUMENTARY: Negative for rash.       Objective:    Physical Exam PHYSICAL EXAM:   VS: BP 118/68 (BP Location: Left Arm, Patient Position: Sitting, Cuff Size: Normal)   Pulse 85   Temp (!) 97.4 F (36.3 C) (Temporal)   Ht '5\' 5"'  (1.651 m)   Wt 134 lb 9.6 oz (61.1 kg)   SpO2 96%   BMI 22.40 kg/m   GEN: Well nourished,  well developed, in no acute distress  HEENT: normal external ears and nose - normal external auditory canals and TMS - hearing grossly normal - normal nasal mucosa and septum - Cardiac: RRR; no murmurs, rubs, or gallops,no edema - no significant varicosities Respiratory:  faint scattered rhonchi/wheezes noted --- much improvement from last week Skin: warm and dry, no rash  Psych: euthymic mood, appropriate affect and demeanor  BP 118/68 (BP Location: Left Arm, Patient Position: Sitting, Cuff Size: Normal)   Pulse 85   Temp (!) 97.4 F (36.3 C) (Temporal)   Ht '5\' 5"'  (1.651 m)   Wt 134 lb 9.6 oz (61.1 kg)   SpO2 96%   BMI 22.40 kg/m  Wt Readings from Last 3 Encounters:  11/18/21 134 lb 9.6 oz (61.1 kg)  11/13/21 131 lb 6.4 oz (59.6 kg)  11/05/21 138 lb (62.6 kg)    Health Maintenance Due  Topic Date Due   Pneumococcal Vaccine 35-22 Years old (1 - PCV) Never done   PAP SMEAR-Modifier  Never done    There are no preventive care reminders to display for this patient.   Lab Results  Component Value Date   TSH 3.110 10/16/2021   Lab Results  Component Value Date   WBC 20.3 (HH) 11/13/2021   HGB 14.0 11/13/2021   HCT 41.5 11/13/2021   MCV 91 11/13/2021   PLT 505 (H) 11/13/2021   Lab Results  Component Value Date   NA 140 11/13/2021   K 3.9 11/13/2021   CO2 23 11/13/2021   GLUCOSE 89 11/13/2021   BUN 21 11/13/2021   CREATININE 0.85 11/13/2021   BILITOT 0.3 11/13/2021   ALKPHOS 82 11/13/2021   AST 19 11/13/2021   ALT 30 11/13/2021   PROT 6.5 11/13/2021   ALBUMIN 4.3 11/13/2021   CALCIUM 9.5 11/13/2021   EGFR 78 11/13/2021   Lab  Results  Component Value Date   CHOL 150 10/16/2021   Lab Results  Component Value Date   HDL 66 10/16/2021   Lab Results  Component Value Date   LDLCALC 64 10/16/2021   Lab Results  Component Value Date   TRIG 112 10/16/2021   Lab Results  Component Value Date   CHOLHDL 2.3 10/16/2021   No results found for: HGBA1C     Assessment & Plan:   Problem List Items Addressed This Visit       Respiratory   Chronic obstructive pulmonary disease with (acute) lower respiratory infection ([place]) - Primary   Relevant Orders   CBC with Differential/Platelet Continue current meds   No orders of the defined types were placed in this encounter.   Orders Placed This Encounter  Procedures   CBC with Differential/Platelet     Follow-up: Return if symptoms worsen or fail to improve.  An After Visit Summary was printed and given to the patient.  Yetta Flock [facility] [phone]

## 2021-11-19 ENCOUNTER — Other Ambulatory Visit: Payer: Self-pay | Admitting: Physician Assistant

## 2021-11-19 DIAGNOSIS — R899 Unspecified abnormal finding in specimens from other organs, systems and tissues: Secondary | ICD-10-CM

## 2021-11-19 LAB — CBC WITH DIFFERENTIAL/PLATELET
Basophils Absolute: 0.1 10*3/uL (ref 0.0–0.2)
Basos: 0 %
EOS (ABSOLUTE): 0.2 10*3/uL (ref 0.0–0.4)
Eos: 1 %
Hematocrit: 40.9 % (ref 34.0–46.6)
Hemoglobin: 13.4 g/dL (ref 11.1–15.9)
Immature Grans (Abs): 0.1 10*3/uL (ref 0.0–0.1)
Immature Granulocytes: 1 %
Lymphocytes Absolute: 7.1 10*3/uL — ABNORMAL HIGH (ref 0.7–3.1)
Lymphs: 40 %
MCH: 30.4 pg (ref 26.6–33.0)
MCHC: 32.8 g/dL (ref 31.5–35.7)
MCV: 93 fL (ref 79–97)
Monocytes Absolute: 1.2 10*3/uL — ABNORMAL HIGH (ref 0.1–0.9)
Monocytes: 7 %
Neutrophils Absolute: 9 10*3/uL — ABNORMAL HIGH (ref 1.4–7.0)
Neutrophils: 51 %
Platelets: 425 10*3/uL (ref 150–450)
RBC: 4.41 x10E6/uL (ref 3.77–5.28)
RDW: 13.3 % (ref 11.7–15.4)
WBC: 17.6 10*3/uL — ABNORMAL HIGH (ref 3.4–10.8)

## 2021-11-25 ENCOUNTER — Other Ambulatory Visit: Payer: 59

## 2021-11-25 ENCOUNTER — Other Ambulatory Visit: Payer: Self-pay

## 2021-11-25 DIAGNOSIS — R899 Unspecified abnormal finding in specimens from other organs, systems and tissues: Secondary | ICD-10-CM

## 2021-11-25 LAB — CBC WITH DIFFERENTIAL/PLATELET
Basophils Absolute: 0.1 10*3/uL (ref 0.0–0.2)
Basos: 0 %
EOS (ABSOLUTE): 0.2 10*3/uL (ref 0.0–0.4)
Eos: 2 %
Hematocrit: 38.6 % (ref 34.0–46.6)
Hemoglobin: 13.4 g/dL (ref 11.1–15.9)
Immature Grans (Abs): 0.1 10*3/uL (ref 0.0–0.1)
Immature Granulocytes: 1 %
Lymphocytes Absolute: 5.8 10*3/uL — ABNORMAL HIGH (ref 0.7–3.1)
Lymphs: 44 %
MCH: 31.5 pg (ref 26.6–33.0)
MCHC: 34.7 g/dL (ref 31.5–35.7)
MCV: 91 fL (ref 79–97)
Monocytes Absolute: 0.9 10*3/uL (ref 0.1–0.9)
Monocytes: 7 %
Neutrophils Absolute: 6.2 10*3/uL (ref 1.4–7.0)
Neutrophils: 46 %
Platelets: 390 10*3/uL (ref 150–450)
RBC: 4.26 x10E6/uL (ref 3.77–5.28)
RDW: 13.1 % (ref 11.7–15.4)
WBC: 13.2 10*3/uL — ABNORMAL HIGH (ref 3.4–10.8)

## 2021-11-27 ENCOUNTER — Other Ambulatory Visit: Payer: Self-pay | Admitting: Physician Assistant

## 2021-12-03 ENCOUNTER — Other Ambulatory Visit: Payer: Self-pay | Admitting: Physician Assistant

## 2021-12-03 DIAGNOSIS — J060 Acute laryngopharyngitis: Secondary | ICD-10-CM

## 2021-12-12 ENCOUNTER — Other Ambulatory Visit: Payer: Self-pay | Admitting: Physician Assistant

## 2021-12-17 ENCOUNTER — Encounter: Payer: Self-pay | Admitting: Physician Assistant

## 2021-12-17 ENCOUNTER — Ambulatory Visit (INDEPENDENT_AMBULATORY_CARE_PROVIDER_SITE_OTHER): Payer: 59 | Admitting: Physician Assistant

## 2021-12-17 VITALS — BP 112/72 | HR 70 | Temp 95.7°F | Wt 142.6 lb

## 2021-12-17 DIAGNOSIS — M940 Chondrocostal junction syndrome [Tietze]: Secondary | ICD-10-CM

## 2021-12-17 MED ORDER — PREDNISONE 20 MG PO TABS: ORAL_TABLET | ORAL | 0 refills | Status: AC

## 2021-12-17 NOTE — Progress Notes (Signed)
Acute Office Visit  Subjective:    Patient ID: Lori Wise, female    DOB: 05/01/1961, 61 y.o.   MRN: [id number]  Chief Complaint  Patient presents with   URI    HPI: Patient is in today for recheck of lungs - she had a clinical pneumonia and had treatment in past several weeks - states she is overall doing better but having soreness in upper chest and shoulder when coughing hard -- denies fever or productive cough Is using albuterol and symbicort  Past Medical History:  Diagnosis Date   Acute laryngopharyngitis 04/19/2020   Anxiety 04/19/2020   Atypical nevus 04/19/2020   Bronchitis 04/30/2020   Chronic obstructive pulmonary disease ([place]) 10/22/2020   Chronic obstructive pulmonary disease with (acute) lower respiratory infection ([place])    Encounter for immunization 04/19/2020   Essential (primary) hypertension    Gastroesophageal reflux disease 10/22/2020   Hormone replacement therapy (HRT) 10/22/2020   Migraine without aura, not intractable, without status migrainosus    Mixed hyperlipidemia    Other chest pain 03/21/2021   Other specified hypothyroidism 04/19/2020   Primary hypertension 03/21/2021   Right leg pain 07/23/2020   Sunburn of second degree     Past Surgical History:  Procedure Laterality Date   ABDOMINAL HYSTERECTOMY     CARDIAC CATHETERIZATION  2000   normal   CHOLECYSTECTOMY     OTHER SURGICAL HISTORY     Ear surgeries x5    Family History  Problem Relation Age of Onset   Suicidality Mother    Lung cancer Father    Cancer Brother        Brain Cancer   Cancer Sister        Cell-1   Lung cancer Brother     Social History   Socioeconomic History   Marital status: Legally Separated    Spouse name: Not on file   Number of children: 2   Years of education: Not on file   Highest education level: Not on file  Occupational History   Occupation: [employer]  Tobacco Use   Smoking status: Every Day    Packs/day: 1.00    Types: Cigarettes    Smokeless tobacco: Never  Vaping Use   Vaping Use: Never used  Substance and Sexual Activity   Alcohol use: Not Currently    Comment: Drinks approximately two times per week.   Drug use: Never   Sexual activity: Not on file  Other Topics Concern   Not on file  Social History Narrative   Not on file   Social Determinants of Health   Financial Resource Strain: Not on file  Food Insecurity: Not on file  Transportation Needs: Not on file  Physical Activity: Not on file  Stress: Not on file  Social Connections: Not on file  Intimate Partner Violence: Not on file    Outpatient Medications Prior to Visit  Medication Sig Dispense Refill   albuterol (VENTOLIN HFA) 108 (90 Base) MCG/ACT inhaler Inhale 2 puffs into the lungs every 6 (six) hours as needed for wheezing or shortness of breath. 8 g 2   ALPRAZolam (XANAX) 0.5 MG tablet TAKE ONE TABLET BY MOUTH 3 TIMES DAILY 90 tablet 0   Ascorbic Acid (VITAMIN C PO) Take 1 tablet by mouth daily. Unknown strength     aspirin 81 MG chewable tablet Chew 81 mg by mouth daily.     atenolol (TENORMIN) 25 MG tablet TAKE 2 TABLETS BY MOUTH EVERY DAY (Patient  taking differently: Take 50 mg by mouth daily. TAKE 2 TABLETS BY MOUTH EVERY DAY) 60 tablet 3   budesonide-formoterol (SYMBICORT) 160-4.5 MCG/ACT inhaler Inhale 2 puffs into the lungs 2 (two) times daily. 10.2 g 3   cetirizine (ZYRTEC) 10 MG tablet Take 1 tablet (10 mg total) by mouth daily. 30 tablet 11   Cyanocobalamin (VITAMIN B-12 PO) Take 1 tablet by mouth daily. Unknown strenght     escitalopram (LEXAPRO) 10 MG tablet Take 1 tablet (10 mg total) by mouth daily. 30 tablet 2   estradiol (ESTRACE) 0.5 MG tablet Take 1 tablet (0.5 mg total) by mouth daily. 90 tablet 1   ezetimibe (ZETIA) 10 MG tablet TAKE ONE TABLET BY MOUTH DAILY 30 tablet 1   ibuprofen (ADVIL) 800 MG tablet TAKE ONE TABLET BY MOUTH EVERY 8 HOURS AS NEEDED FOR PAIN 60 tablet 2   ipratropium-albuterol (DUONEB) 0.5-2.5 (3) MG/3ML  SOLN USE 1 VIAL IN NEBULIZER EVERY 4 TO 6 HOURS AS NEEDED (Patient taking differently: Inhale 3 mLs into the lungs every 4 (four) hours as needed (SOB). USE 1 VIAL IN NEBULIZER EVERY 4 TO 6 HOURS AS NEEDED) 360 mL 0   levothyroxine (SYNTHROID) 25 MCG tablet TAKE ONE TABLET BY MOUTH EVERY DAY 30 tablet 1   Omega-3 1000 MG CAPS Take 1 capsule by mouth daily.     omeprazole (PRILOSEC) 20 MG capsule TAKE ONE CAPSULE BY MOUTH EVERY DAY 30 capsule 1   Vitamin D, Ergocalciferol, (DRISDOL) 1.25 MG (50000 UNIT) CAPS capsule Take 50,000 Units by mouth every 7 (seven) days.     VITAMIN E PO Take 1 tablet by mouth daily at 6 (six) AM. Unknown strength     rosuvastatin (CRESTOR) 5 MG tablet Take 1 tablet (5 mg total) by mouth daily. 90 tablet 3   benzonatate (TESSALON) 200 MG capsule Take 1 capsule (200 mg total) by mouth 2 (two) times daily as needed for cough. 30 capsule 1   fluconazole (DIFLUCAN) 150 MG tablet Take 1 tablet (150 mg total) by mouth daily. 1 tablet 1   predniSONE (DELTASONE) 20 MG tablet Take 1 tablet (20 mg total) by mouth daily with breakfast. 1 po tid for 3 days then 1 po bid for 3 days then 1 po qd for 3 days 18 tablet 0   Facility-Administered Medications Prior to Visit  Medication Dose Route Frequency Provider Last Rate Last Admin   ipratropium-albuterol (DUONEB) 0.5-2.5 (3) MG/3ML nebulizer solution 3 mL  3 mL Nebulization Once Rip Harbour, NP        Allergies  Allergen Reactions   Codeine Hives, Itching, Other (See Comments), Rash and Nausea And Vomiting    Review of Systems CONSTITUTIONAL: Negative for chills, fatigue, fever, unintentional weight gain and unintentional weight loss.  E/N/T: Negative for ear pain, nasal congestion and sore throat.  CARDIOVASCULAR: Negative for chest pain, dizziness, palpitations and pedal edema.  RESPIRATORY: Negative for recent cough and dyspnea.  GASTROINTESTINAL: Negative for abdominal pain, acid reflux symptoms, constipation, diarrhea,  nausea and vomiting.  MSK:see HPI         Objective:    Physical Exam PHYSICAL EXAM:   VS: BP 112/72 (BP Location: Left Arm, Patient Position: Sitting, Cuff Size: Normal)    Pulse 70    Temp (!) 95.7 F (35.4 C) (Temporal)    Wt 142 lb 9.6 oz (64.7 kg)    SpO2 99%    BMI 23.73 kg/m   GEN: Well nourished, well developed, in no  acute distress  HEENT: normal external ears and nose - normal external auditory canals and TMS -  - Lips, Teeth and Gums - normal  Oropharynx - normal mucosa, palate, and posterior pharynx Cardiac: RRR; no murmurs, rubs, or gallops,no edema -  Respiratory:  normal respiratory rate and pattern with no distress - normal breath sounds with no rales, rhonchi, wheezes or rubs  MS: no deformity or atrophy    BP 112/72 (BP Location: Left Arm, Patient Position: Sitting, Cuff Size: Normal)    Pulse 70    Temp (!) 95.7 F (35.4 C) (Temporal)    Wt 142 lb 9.6 oz (64.7 kg)    SpO2 99%    BMI 23.73 kg/m  Wt Readings from Last 3 Encounters:  12/17/21 142 lb 9.6 oz (64.7 kg)  11/18/21 134 lb 9.6 oz (61.1 kg)  11/13/21 131 lb 6.4 oz (59.6 kg)    Health Maintenance Due  Topic Date Due   Pneumococcal Vaccine 60-47 Years old (1 - PCV) Never done   PAP SMEAR-Modifier  Never done    There are no preventive care reminders to display for this patient.   Lab Results  Component Value Date   TSH 3.110 10/16/2021   Lab Results  Component Value Date   WBC 13.2 (H) 11/25/2021   HGB 13.4 11/25/2021   HCT 38.6 11/25/2021   MCV 91 11/25/2021   PLT 390 11/25/2021   Lab Results  Component Value Date   NA 140 11/13/2021   K 3.9 11/13/2021   CO2 23 11/13/2021   GLUCOSE 89 11/13/2021   BUN 21 11/13/2021   CREATININE 0.85 11/13/2021   BILITOT 0.3 11/13/2021   ALKPHOS 82 11/13/2021   AST 19 11/13/2021   ALT 30 11/13/2021   PROT 6.5 11/13/2021   ALBUMIN 4.3 11/13/2021   CALCIUM 9.5 11/13/2021   EGFR 78 11/13/2021   Lab Results  Component Value Date   CHOL 150  10/16/2021   Lab Results  Component Value Date   HDL 66 10/16/2021   Lab Results  Component Value Date   LDLCALC 64 10/16/2021   Lab Results  Component Value Date   TRIG 112 10/16/2021   Lab Results  Component Value Date   CHOLHDL 2.3 10/16/2021   No results found for: HGBA1C     Assessment & Plan:   Problem List Items Addressed This Visit   None Visit Diagnoses     Costochondritis    -  Primary   Relevant Medications   predniSONE (DELTASONE) 20 MG tablet      Meds ordered this encounter  Medications   predniSONE (DELTASONE) 20 MG tablet    Sig: Take 3 tablets (60 mg total) by mouth daily with breakfast for 3 days, THEN 2 tablets (40 mg total) daily with breakfast for 3 days, THEN 1 tablet (20 mg total) daily with breakfast for 3 days.    Dispense:  18 tablet    Refill:  0    Order Specific Question:   Supervising Provider    Answer:   [name]    No orders of the defined types were placed in this encounter.    Follow-up: Return if symptoms worsen or fail to improve.  An After Visit Summary was printed and given to the patient.  Yetta Flock [facility] [phone]

## 2021-12-27 ENCOUNTER — Other Ambulatory Visit: Payer: Self-pay | Admitting: Physician Assistant

## 2021-12-27 DIAGNOSIS — E782 Mixed hyperlipidemia: Secondary | ICD-10-CM

## 2021-12-30 ENCOUNTER — Ambulatory Visit (INDEPENDENT_AMBULATORY_CARE_PROVIDER_SITE_OTHER): Payer: 59 | Admitting: Nurse Practitioner

## 2021-12-30 ENCOUNTER — Encounter: Payer: Self-pay | Admitting: Nurse Practitioner

## 2021-12-30 VITALS — BP 142/72 | HR 88 | Resp 15 | Ht 65.0 in | Wt 138.0 lb

## 2021-12-30 DIAGNOSIS — R55 Syncope and collapse: Secondary | ICD-10-CM | POA: Diagnosis not present

## 2021-12-30 DIAGNOSIS — R040 Epistaxis: Secondary | ICD-10-CM

## 2021-12-30 NOTE — Addendum Note (Signed)
Addended by: Rip Harbour on: 12/30/2021 10:11 PM   Modules accepted: Level of Service

## 2021-12-30 NOTE — Progress Notes (Addendum)
Acute Office Visit  Subjective:    Patient ID: Lori Wise, female    DOB: 01-04-1961, 61 y.o.   MRN: [id number]  Chief Complaint  Patient presents with   Epistaxis    HPI: Patient is in today for epistaxis since this morning at 7:15 am.States her sheets and pillow were saturated. Has nausea and vomiting blood that she swallowed. C/o dizziness. Pt tried to hold pressure to nose to stop bleeding without success. Bilateral nares bleeding as pt entered office. 1" packing inserted to bilateral nares. Pressure applied to bridge of nose. Pt denies previous nose bleeds, trauma to nose. Pt has history of hypertension, took antihypertensives this am.   Past Medical History:  Diagnosis Date   Acute laryngopharyngitis 04/19/2020   Anxiety 04/19/2020   Atypical nevus 04/19/2020   Bronchitis 04/30/2020   Chronic obstructive pulmonary disease ([place]) 10/22/2020   Chronic obstructive pulmonary disease with (acute) lower respiratory infection ([place])    Encounter for immunization 04/19/2020   Essential (primary) hypertension    Gastroesophageal reflux disease 10/22/2020   Hormone replacement therapy (HRT) 10/22/2020   Migraine without aura, not intractable, without status migrainosus    Mixed hyperlipidemia    Other chest pain 03/21/2021   Other specified hypothyroidism 04/19/2020   Primary hypertension 03/21/2021   Right leg pain 07/23/2020   Sunburn of second degree     Past Surgical History:  Procedure Laterality Date   ABDOMINAL HYSTERECTOMY     CARDIAC CATHETERIZATION  2000   normal   CHOLECYSTECTOMY     OTHER SURGICAL HISTORY     Ear surgeries x5    Family History  Problem Relation Age of Onset   Suicidality Mother    Lung cancer Father    Cancer Brother        Brain Cancer   Cancer Sister        Cell-1   Lung cancer Brother     Social History   Socioeconomic History   Marital status: Legally Separated    Spouse name: Not on file   Number of children: 2   Years of education:  Not on file   Highest education level: Not on file  Occupational History   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   Drug use: Never   Sexual activity: Not on file  Other Topics Concern   Not on file  Social History Narrative   Not on file   Social Determinants of Health   Financial Resource Strain: Not on file  Food Insecurity: Not on file  Transportation Needs: Not on file  Physical Activity: Not on file  Stress: Not on file  Social Connections: Not on file  Intimate Partner Violence: Not on file    Outpatient Medications Prior to Visit  Medication Sig Dispense Refill   albuterol (VENTOLIN HFA) 108 (90 Base) MCG/ACT inhaler Inhale 2 puffs into the lungs every 6 (six) hours as needed for wheezing or shortness of breath. 8 g 2   ALPRAZolam (XANAX) 0.5 MG tablet TAKE ONE TABLET BY MOUTH 3 TIMES DAILY 90 tablet 0   Ascorbic Acid (VITAMIN C PO) Take 1 tablet by mouth daily. Unknown strength     aspirin  81 MG chewable tablet Chew 81 mg by mouth daily.     atenolol (TENORMIN) 25 MG tablet TAKE 2 TABLETS BY MOUTH EVERY DAY 60 tablet 3   budesonide-formoterol (SYMBICORT) 160-4.5 MCG/ACT inhaler Inhale 2 puffs into the lungs 2 (two) times daily. 10.2 g 3   cetirizine (ZYRTEC) 10 MG tablet Take 1 tablet (10 mg total) by mouth daily. 30 tablet 11   Cyanocobalamin (VITAMIN B-12 PO) Take 1 tablet by mouth daily. Unknown strenght     escitalopram (LEXAPRO) 10 MG tablet Take 1 tablet (10 mg total) by mouth daily. 30 tablet 2   estradiol (ESTRACE) 0.5 MG tablet Take 1 tablet (0.5 mg total) by mouth daily. 90 tablet 1   ezetimibe (ZETIA) 10 MG tablet TAKE ONE TABLET BY MOUTH DAILY 30 tablet 1   ibuprofen (ADVIL) 800 MG tablet TAKE ONE TABLET  BY MOUTH EVERY 8 HOURS AS NEEDED FOR PAIN 60 tablet 2   ipratropium-albuterol (DUONEB) 0.5-2.5 (3) MG/3ML SOLN USE 1 VIAL IN NEBULIZER EVERY 4 TO 6 HOURS AS NEEDED (Patient taking differently: Inhale 3 mLs into the lungs every 4 (four) hours as needed (SOB). USE 1 VIAL IN NEBULIZER EVERY 4 TO 6 HOURS AS NEEDED) 360 mL 0   levothyroxine (SYNTHROID) 25 MCG tablet TAKE ONE TABLET BY MOUTH EVERY DAY 30 tablet 1   Omega-3 1000 MG CAPS Take 1 capsule by mouth daily.     omeprazole (PRILOSEC) 20 MG capsule TAKE ONE CAPSULE BY MOUTH EVERY DAY 30 capsule 1   rosuvastatin (CRESTOR) 5 MG tablet Take 1 tablet (5 mg total) by mouth daily. 90 tablet 3   Vitamin D, Ergocalciferol, (DRISDOL) 1.25 MG (50000 UNIT) CAPS capsule Take 50,000 Units by mouth every 7 (seven) days.     VITAMIN E PO Take 1 tablet by mouth daily at 6 (six) AM. Unknown strength     Facility-Administered Medications Prior to Visit  Medication Dose Route Frequency Provider Last Rate Last Admin   ipratropium-albuterol (DUONEB) 0.5-2.5 (3) MG/3ML nebulizer solution 3 mL  3 mL Nebulization Once Rip Harbour, NP        Allergies  Allergen Reactions   Codeine Hives, Itching, Other (See Comments), Rash and Nausea And Vomiting    Review of Systems  Constitutional:  Negative for chills, fatigue and fever.  HENT:  Positive for nosebleeds. Negative for congestion, ear pain and sore throat.   Respiratory:  Negative for cough and shortness of breath.   Cardiovascular:  Negative for chest pain and palpitations.  Gastrointestinal:  Positive for nausea and vomiting. Negative for abdominal pain, constipation and diarrhea.  Endocrine: Negative for polydipsia, polyphagia and polyuria.  Genitourinary:  Negative for difficulty urinating and dysuria.  Musculoskeletal:  Negative for arthralgias, back pain and myalgias.  Skin:  Negative for rash.  Neurological:  Negative for headaches.  Psychiatric/Behavioral:  Negative for dysphoric mood. The  patient is not nervous/anxious.       Objective:    Physical Exam Vitals reviewed.  Constitutional:      General: She is in acute distress.  Cardiovascular:     Rate and Rhythm: Tachycardia present.  Skin:    Capillary Refill: Capillary refill takes less than 2 seconds.     Coloration: Skin is pale.  Neurological:     General: No focal deficit present.     Mental Status: She is alert and oriented to person, place, and time.  Psychiatric:        Mood and Affect: Mood normal.  Behavior: Behavior normal.    BP (!) 182/92    Pulse (!) 106    Resp 15    Ht '5\' 5"'  (1.651 m)    Wt 138 lb (62.6 kg)    BMI 22.96 kg/m  Wt Readings from Last 3 Encounters:  12/30/21 138 lb (62.6 kg)  12/17/21 142 lb 9.6 oz (64.7 kg)  11/18/21 134 lb 9.6 oz (61.1 kg)    Health Maintenance Due  Topic Date Due   PAP SMEAR-Modifier  Never done       Lab Results  Component Value Date   TSH 3.110 10/16/2021   Lab Results  Component Value Date   WBC 13.2 (H) 11/25/2021   HGB 13.4 11/25/2021   HCT 38.6 11/25/2021   MCV 91 11/25/2021   PLT 390 11/25/2021   Lab Results  Component Value Date   NA 140 11/13/2021   K 3.9 11/13/2021   CO2 23 11/13/2021   GLUCOSE 89 11/13/2021   BUN 21 11/13/2021   CREATININE 0.85 11/13/2021   BILITOT 0.3 11/13/2021   ALKPHOS 82 11/13/2021   AST 19 11/13/2021   ALT 30 11/13/2021   PROT 6.5 11/13/2021   ALBUMIN 4.3 11/13/2021   CALCIUM 9.5 11/13/2021   EGFR 78 11/13/2021   Lab Results  Component Value Date   CHOL 150 10/16/2021   Lab Results  Component Value Date   HDL 66 10/16/2021   Lab Results  Component Value Date   LDLCALC 64 10/16/2021   Lab Results  Component Value Date   TRIG 112 10/16/2021   Lab Results  Component Value Date   CHOLHDL 2.3 10/16/2021        Assessment & Plan:   1. Epistaxis 1" gauze packing inserted to bilateral nares  2. Syncope, unspecified syncope type -witnessed syncopa    Pt had witnessed  syncopal episode. [place] called . Pt taken to [facility] via ambulance in stable condition at [phone].     Follow-up: PRN  An After Visit Summary was printed and given to the patient. I, Rip Harbour, NP, have reviewed all documentation for this visit. The documentation on 12/30/21 for the exam, diagnosis, procedures, and orders are all accurate and complete.   Rip Harbour, NP [place] [phone]

## 2022-01-08 ENCOUNTER — Ambulatory Visit (INDEPENDENT_AMBULATORY_CARE_PROVIDER_SITE_OTHER): Payer: 59 | Admitting: Physician Assistant

## 2022-01-08 ENCOUNTER — Encounter: Payer: Self-pay | Admitting: Physician Assistant

## 2022-01-08 ENCOUNTER — Other Ambulatory Visit: Payer: Self-pay

## 2022-01-08 VITALS — BP 138/60 | HR 85 | Temp 97.8°F | Ht 65.0 in | Wt 141.2 lb

## 2022-01-08 DIAGNOSIS — R899 Unspecified abnormal finding in specimens from other organs, systems and tissues: Secondary | ICD-10-CM | POA: Diagnosis not present

## 2022-01-08 DIAGNOSIS — R040 Epistaxis: Secondary | ICD-10-CM | POA: Diagnosis not present

## 2022-01-08 DIAGNOSIS — R5383 Other fatigue: Secondary | ICD-10-CM | POA: Diagnosis not present

## 2022-01-08 NOTE — Progress Notes (Signed)
Subjective:  Patient ID: Lori Wise, female    DOB: 03-27-1961  Age: 61 y.o. MRN: [id number]  Chief Complaint  Patient presents with   Epistaxis    Hosptial follow up    HPI  Pt here today for follow up after having significant nosebleed on 1/23 - She was seen in office bleeding prfusely from both nostrils and had syncopal episode here in office - she was transported to [facility] ED by EMS and states she was actually put in chair in waiting room where she had another syncopal episode before being taken back to be seen.  She had labwork done which showed normal PT/PTT, stable cmp but cbc showed elevated wbc -  Pt had balloon placed in right nostril (left one stopped on its own) She then followed up with ENT Dr Laurance Flatten and had packing removed and mucosa cauterized Pt states she has minimal blood/discharge now from right nostril but continues to have fatigue Current Outpatient Medications on File Prior to Visit  Medication Sig Dispense Refill   albuterol (VENTOLIN HFA) 108 (90 Base) MCG/ACT inhaler Inhale 2 puffs into the lungs every 6 (six) hours as needed for wheezing or shortness of breath. 8 g 2   ALPRAZolam (XANAX) 0.5 MG tablet TAKE ONE TABLET BY MOUTH 3 TIMES DAILY 90 tablet 0   Ascorbic Acid (VITAMIN C PO) Take 1 tablet by mouth daily. Unknown strength     aspirin 81 MG chewable tablet Chew 81 mg by mouth daily.     atenolol (TENORMIN) 25 MG tablet TAKE 2 TABLETS BY MOUTH EVERY DAY 60 tablet 3   budesonide-formoterol (SYMBICORT) 160-4.5 MCG/ACT inhaler Inhale 2 puffs into the lungs 2 (two) times daily. 10.2 g 3   cetirizine (ZYRTEC) 10 MG tablet Take 1 tablet (10 mg total) by mouth daily. 30 tablet 11   Cyanocobalamin (VITAMIN B-12 PO) Take 1 tablet by mouth daily. Unknown strenght     escitalopram (LEXAPRO) 10 MG tablet Take 1 tablet (10 mg total) by mouth daily. 30 tablet 2   estradiol (ESTRACE) 0.5 MG tablet Take 1 tablet (0.5 mg total) by mouth daily. 90 tablet 1   ezetimibe  (ZETIA) 10 MG tablet TAKE ONE TABLET BY MOUTH DAILY 30 tablet 1   ibuprofen (ADVIL) 800 MG tablet TAKE ONE TABLET BY MOUTH EVERY 8 HOURS AS NEEDED FOR PAIN 60 tablet 2   ipratropium-albuterol (DUONEB) 0.5-2.5 (3) MG/3ML SOLN USE 1 VIAL IN NEBULIZER EVERY 4 TO 6 HOURS AS NEEDED (Patient taking differently: Inhale 3 mLs into the lungs every 4 (four) hours as needed (SOB). USE 1 VIAL IN NEBULIZER EVERY 4 TO 6 HOURS AS NEEDED) 360 mL 0   levothyroxine (SYNTHROID) 25 MCG tablet TAKE ONE TABLET BY MOUTH EVERY DAY 30 tablet 1   Omega-3 1000 MG CAPS Take 1 capsule by mouth daily.     omeprazole (PRILOSEC) 20 MG capsule TAKE ONE CAPSULE BY MOUTH EVERY DAY 30 capsule 1   Vitamin D, Ergocalciferol, (DRISDOL) 1.25 MG (50000 UNIT) CAPS capsule Take 50,000 Units by mouth every 7 (seven) days.     VITAMIN E PO Take 1 tablet by mouth daily at 6 (six) AM. Unknown strength     rosuvastatin (CRESTOR) 5 MG tablet Take 1 tablet (5 mg total) by mouth daily. 90 tablet 3   Current Facility-Administered Medications on File Prior to Visit  Medication Dose Route Frequency Provider Last Rate Last Admin   ipratropium-albuterol (DUONEB) 0.5-2.5 (3) MG/3ML nebulizer solution 3 mL  3 mL Nebulization Once Rip Harbour, NP       Past Medical History:  Diagnosis Date   Acute laryngopharyngitis 04/19/2020   Anxiety 04/19/2020   Atypical nevus 04/19/2020   Bronchitis 04/30/2020   Chronic obstructive pulmonary disease ([place]) 10/22/2020   Chronic obstructive pulmonary disease with (acute) lower respiratory infection ([place])    Encounter for immunization 04/19/2020   Essential (primary) hypertension    Gastroesophageal reflux disease 10/22/2020   Hormone replacement therapy (HRT) 10/22/2020   Migraine without aura, not intractable, without status migrainosus    Mixed hyperlipidemia    Other chest pain 03/21/2021   Other specified hypothyroidism 04/19/2020   Primary hypertension 03/21/2021   Right leg pain 07/23/2020   Sunburn of  second degree    Past Surgical History:  Procedure Laterality Date   ABDOMINAL HYSTERECTOMY     CARDIAC CATHETERIZATION  2000   normal   CHOLECYSTECTOMY     OTHER SURGICAL HISTORY     Ear surgeries x5    Family History  Problem Relation Age of Onset   Suicidality Mother    Lung cancer Father    Cancer Brother        Brain Cancer   Cancer Sister        Cell-1   Lung cancer Brother    Social History   Socioeconomic History   Marital status: Legally Separated    Spouse name: Not on file   Number of children: 2   Years of education: Not on file   Highest education level: Not on file  Occupational History   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   Drug use: Never   Sexual activity: Not on file  Other Topics Concern   Not on file  Social History Narrative   Not on file   Social Determinants of Health   Financial Resource Strain: Not on file  Food Insecurity: Not on file  Transportation Needs: Not on file  Physical Activity: Not on file  Stress: Not on file  Social Connections: Not on file    Review of Systems CONSTITUTIONAL: see HPI E/N/T: Negative for ear pain, nasal congestion and sore throat.  CARDIOVASCULAR: Negative for chest pain, dizziness, palpitations and pedal edema.  RESPIRATORY: Negative for recent cough and dyspnea.  GASTROINTESTINAL: Negative for abdominal pain, acid reflux symptoms, constipation, diarrhea, nausea and vomiting.        Objective:  BP 138/60    Pulse 85    Temp 97.8 F (36.6 C)    Ht 5\' 5"  (1.651 m)    Wt 141 lb 3.2 oz (64 kg)    SpO2 98%    BMI 23.50 kg/m   BP/Weight 01/08/2022 12/30/2021 03/10/4741  Systolic BP 595 638 756  Diastolic BP 60 72 72  Wt. (Lbs) 141.2 138 142.6   BMI 23.5 22.96 23.73    Physical Exam PHYSICAL EXAM:   VS: BP 138/60    Pulse 85    Temp 97.8 F (36.6 C)    Ht 5\' 5"  (1.651 m)    Wt 141 lb 3.2 oz (64 kg)    SpO2 98%    BMI 23.50 kg/m   GEN: Well nourished, well  developed, in no acute distress  HEENT: left nasal mucosa with minimal erythema - right nasal mucosa shows cauterized areas Oropharynx - normal mucosa, palate, and posterior pharynx Cardiac: RRR; no murmurs,  Respiratory:  normal respiratory rate and pattern with no distress - normal breath sounds with no rales, rhonchi, wheezes or rubs Psych: euthymic mood, appropriate affect and demeanor  Diabetic Foot Exam - Simple   No data filed      Lab Results  Component Value Date   WBC 13.2 (H) 11/25/2021   HGB 13.4 11/25/2021   HCT 38.6 11/25/2021   PLT 390 11/25/2021   GLUCOSE 89 11/13/2021   CHOL 150 10/16/2021   TRIG 112 10/16/2021   HDL 66 10/16/2021   LDLCALC 64 10/16/2021   ALT 30 11/13/2021   AST 19 11/13/2021   NA 140 11/13/2021   K 3.9 11/13/2021   CL 106 11/13/2021   CREATININE 0.85 11/13/2021   BUN 21 11/13/2021   CO2 23 11/13/2021   TSH 3.110 10/16/2021      Assessment & Plan:   Problem List Items Addressed This Visit   None Visit Diagnoses     Epistaxis    -  Primary   Relevant Orders   CBC with Differential/Platelet   Comprehensive metabolic panel Resolved - continue daily nasal moisturizer   Abnormal laboratory test       Relevant Orders   CBC with Differential/Platelet   Comprehensive metabolic panel   Other fatigue       Relevant Orders   B12 and Folate Panel     .  No orders of the defined types were placed in this encounter.   Orders Placed This Encounter  Procedures   CBC with Differential/Platelet   Comprehensive metabolic panel   T70 and Folate Panel     Follow-up: Return if symptoms worsen or fail to improve.  An After Visit Summary was printed and given to the patient.  Yetta Flock [facility] [phone]

## 2022-01-09 LAB — CBC WITH DIFFERENTIAL/PLATELET
Basophils Absolute: 0.1 10*3/uL (ref 0.0–0.2)
Basos: 1 %
EOS (ABSOLUTE): 0.2 10*3/uL (ref 0.0–0.4)
Eos: 1 %
Hematocrit: 34.2 % (ref 34.0–46.6)
Hemoglobin: 11.4 g/dL (ref 11.1–15.9)
Immature Grans (Abs): 0.1 10*3/uL (ref 0.0–0.1)
Immature Granulocytes: 1 %
Lymphocytes Absolute: 4.1 10*3/uL — ABNORMAL HIGH (ref 0.7–3.1)
Lymphs: 35 %
MCH: 31.2 pg (ref 26.6–33.0)
MCHC: 33.3 g/dL (ref 31.5–35.7)
MCV: 94 fL (ref 79–97)
Monocytes Absolute: 0.7 10*3/uL (ref 0.1–0.9)
Monocytes: 6 %
Neutrophils Absolute: 6.7 10*3/uL (ref 1.4–7.0)
Neutrophils: 56 %
Platelets: 414 10*3/uL (ref 150–450)
RBC: 3.65 x10E6/uL — ABNORMAL LOW (ref 3.77–5.28)
RDW: 13.1 % (ref 11.7–15.4)
WBC: 11.7 10*3/uL — ABNORMAL HIGH (ref 3.4–10.8)

## 2022-01-09 LAB — COMPREHENSIVE METABOLIC PANEL
ALT: 12 IU/L (ref 0–32)
AST: 14 IU/L (ref 0–40)
Albumin/Globulin Ratio: 2 (ref 1.2–2.2)
Albumin: 4.1 g/dL (ref 3.8–4.8)
Alkaline Phosphatase: 73 IU/L (ref 44–121)
BUN/Creatinine Ratio: 16 (ref 12–28)
BUN: 11 mg/dL (ref 8–27)
Bilirubin Total: <0.2 mg/dL (ref 0.0–1.2)
CO2: 20 mmol/L (ref 20–29)
Calcium: 9.5 mg/dL (ref 8.7–10.3)
Chloride: 107 mmol/L — ABNORMAL HIGH (ref 96–106)
Creatinine, Ser: 0.68 mg/dL (ref 0.57–1.00)
Globulin, Total: 2.1 g/dL (ref 1.5–4.5)
Glucose: 141 mg/dL — ABNORMAL HIGH (ref 70–99)
Potassium: 3.8 mmol/L (ref 3.5–5.2)
Sodium: 141 mmol/L (ref 134–144)
Total Protein: 6.2 g/dL (ref 6.0–8.5)
eGFR: 99 mL/min/{1.73_m2} (ref >59)

## 2022-01-09 LAB — B12 AND FOLATE PANEL
Folate: 16.9 ng/mL (ref >3.0)
Vitamin B-12: 1669 pg/mL — ABNORMAL HIGH (ref 232–1245)

## 2022-01-13 ENCOUNTER — Other Ambulatory Visit: Payer: Self-pay | Admitting: Physician Assistant

## 2022-01-28 ENCOUNTER — Other Ambulatory Visit: Payer: Self-pay | Admitting: Physician Assistant

## 2022-01-28 DIAGNOSIS — Z79890 Hormone replacement therapy (postmenopausal): Secondary | ICD-10-CM

## 2022-02-11 ENCOUNTER — Other Ambulatory Visit: Payer: Self-pay | Admitting: Physician Assistant

## 2022-02-11 DIAGNOSIS — J3089 Other allergic rhinitis: Secondary | ICD-10-CM

## 2022-02-26 ENCOUNTER — Other Ambulatory Visit: Payer: Self-pay | Admitting: Physician Assistant

## 2022-02-26 DIAGNOSIS — E782 Mixed hyperlipidemia: Secondary | ICD-10-CM

## 2022-03-05 ENCOUNTER — Ambulatory Visit (INDEPENDENT_AMBULATORY_CARE_PROVIDER_SITE_OTHER): Payer: 59 | Admitting: Cardiology

## 2022-03-05 ENCOUNTER — Encounter: Payer: Self-pay | Admitting: Cardiology

## 2022-03-05 VITALS — BP 140/64 | HR 75 | Ht 65.0 in | Wt 142.8 lb

## 2022-03-05 DIAGNOSIS — F17200 Nicotine dependence, unspecified, uncomplicated: Secondary | ICD-10-CM

## 2022-03-05 DIAGNOSIS — I10 Essential (primary) hypertension: Secondary | ICD-10-CM

## 2022-03-05 DIAGNOSIS — R0789 Other chest pain: Secondary | ICD-10-CM

## 2022-03-05 DIAGNOSIS — E782 Mixed hyperlipidemia: Secondary | ICD-10-CM

## 2022-03-05 NOTE — Patient Instructions (Signed)
Medication Instructions:  ?Your physician recommends that you continue on your current medications as directed. Please refer to the Current Medication list given to you today. ? ?*If you need a refill on your cardiac medications before your next appointment, please call your pharmacy* ? ? ?Lab Work: ?None ?If you have labs (blood work) drawn today and your tests are completely normal, you will receive your results only by: ?MyChart Message (if you have MyChart) OR ?A paper copy in the mail ?If you have any lab test that is abnormal or we need to change your treatment, we will call you to review the results. ? ? ?Testing/Procedures: ?None ? ? ?Follow-Up: ?At [facility], you and your health needs are our priority.  As part of our continuing mission to provide you with exceptional heart care, we have created designated Provider Care Teams.  These Care Teams include your primary Cardiologist (physician) and Advanced Practice Providers (APPs -  Physician Assistants and Nurse Practitioners) who all work together to provide you with the care you need, when you need it. ? ?We recommend signing up for the patient portal called "MyChart".  Sign up information is provided on this After Visit Summary.  MyChart is used to connect with patients for Virtual Visits (Telemedicine).  Patients are able to view lab/test results, encounter notes, upcoming appointments, etc.  Non-urgent messages can be sent to your provider as well.   ?To learn more about what you can do with MyChart, go to [email or web address].   ? ?Your next appointment:   ?1 year(s) ? ?The format for your next appointment:   ?In Person ? ?Provider:   ?Jenne Campus, MD  ? ? ?Other Instructions ?None  ?

## 2022-03-05 NOTE — Progress Notes (Signed)
?Cardiology Office Note:   ? ?Date:  03/05/2022  ? ?ID:  Lori Wise, DOB 1961/08/13, MRN [id number] ? ?PCP:  Lori Duncans, [name]  ?Cardiologist:  Jenne Campus, MD   ? ?Referring MD: Lori Duncans, [name]  ? ?Chief Complaint  ?Patient presents with  ? Medication Management  ?I passed out and I was in the emergency room ? ?History of Present Illness:   ? ?Lori Wise is a 61 y.o. female with past medical history significant for COPD, she is a chronic smoker have reduced significantly amount of cigarettes she smokes, essential hypertension, dyslipidemia because of multiple risk for coronary artery disease she got coronary CT angio performed which showed normal coronaries without significant coronary artery disease.  She was put on aspirin cholesterol medication of course we had conversation about quitting smoking which she already reduced cigarettes that she smokes.  However she comes today to my office because in January she passed out apparently she woke up in the morning in the bed full of blood she had nosebleed quite severe and then going to the emergency room and then she passed out over the waiting to be seen with very low blood pressure.  She was resuscitated with fluid actually she required blood transfusion and after that things are fine there is no more chest pain tightness squeezing pressure burning chest dizziness passing out. ? ?Past Medical History:  ?Diagnosis Date  ? Acute laryngopharyngitis 04/19/2020  ? Anxiety 04/19/2020  ? Atypical nevus 04/19/2020  ? Bronchitis 04/30/2020  ? Chronic obstructive pulmonary disease ([place]) 10/22/2020  ? Chronic obstructive pulmonary disease with (acute) lower respiratory infection ([place])   ? Encounter for immunization 04/19/2020  ? Essential (primary) hypertension   ? Gastroesophageal reflux disease 10/22/2020  ? Hormone replacement therapy (HRT) 10/22/2020  ? Migraine without aura, not intractable, without status migrainosus   ? Mixed hyperlipidemia   ? Other chest pain  03/21/2021  ? Other specified hypothyroidism 04/19/2020  ? Primary hypertension 03/21/2021  ? Right leg pain 07/23/2020  ? Sunburn of second degree   ? ? ?Past Surgical History:  ?Procedure Laterality Date  ? ABDOMINAL HYSTERECTOMY    ? CARDIAC CATHETERIZATION  2000  ? normal  ? CHOLECYSTECTOMY    ? NASAL HEMORRHAGE CONTROL    ? OTHER SURGICAL HISTORY    ? Ear surgeries x5  ? ? ?Current Medications: ?Current Meds  ?Medication Sig  ? albuterol (VENTOLIN HFA) 108 (90 Base) MCG/ACT inhaler Inhale 2 puffs into the lungs every 6 (six) hours as needed for wheezing or shortness of breath.  ? ALPRAZolam (XANAX) 0.5 MG tablet TAKE ONE TABLET BY MOUTH 3 TIMES DAILY (Patient taking differently: Take 0.5 mg by mouth 3 (three) times daily as needed for anxiety.)  ? Ascorbic Acid (VITAMIN C PO) Take 1 tablet by mouth daily. Unknown strength  ? atenolol (TENORMIN) 25 MG tablet TAKE 2 TABLETS BY MOUTH EVERY DAY (Patient taking differently: Take 50 mg by mouth daily. TAKE 2 TABLETS BY MOUTH EVERY DAY)  ? b complex vitamins capsule Take 1 capsule by mouth daily.  ? budesonide-formoterol (SYMBICORT) 160-4.5 MCG/ACT inhaler Inhale 2 puffs into the lungs 2 (two) times daily.  ? cetirizine (ZYRTEC) 10 MG tablet Take 1 tablet (10 mg total) by mouth daily.  ? Cyanocobalamin (VITAMIN B-12 PO) Take 1 tablet by mouth daily. Unknown strenght  ? escitalopram (LEXAPRO) 10 MG tablet Take 1 tablet (10 mg total) by mouth daily.  ? estradiol (ESTRACE) 0.5 MG tablet Take 1  tablet (0.5 mg total) by mouth daily.  ? ezetimibe (ZETIA) 10 MG tablet TAKE ONE TABLET BY MOUTH DAILY (Patient taking differently: Take 10 mg by mouth daily.)  ? ibuprofen (ADVIL) 800 MG tablet TAKE ONE TABLET BY MOUTH EVERY 8 HOURS AS NEEDED FOR PAIN (Patient taking differently: Take by mouth every 8 (eight) hours as needed for mild pain or moderate pain.)  ? ipratropium-albuterol (DUONEB) 0.5-2.5 (3) MG/3ML SOLN USE 1 VIAL IN NEBULIZER EVERY 4 TO 6 HOURS AS NEEDED (Patient taking  differently: Inhale 3 mLs into the lungs every 4 (four) hours as needed (SOB). USE 1 VIAL IN NEBULIZER EVERY 4 TO 6 HOURS AS NEEDED)  ? levothyroxine (SYNTHROID) 25 MCG tablet TAKE ONE TABLET BY MOUTH EVERY DAY (Patient taking differently: Take 25 mcg by mouth daily before breakfast.)  ? Omega-3 1000 MG CAPS Take 1 capsule by mouth daily.  ? omeprazole (PRILOSEC) 20 MG capsule TAKE ONE CAPSULE BY MOUTH EVERY DAY (Patient taking differently: Take 20 mg by mouth daily.)  ? rosuvastatin (CRESTOR) 5 MG tablet Take 1 tablet (5 mg total) by mouth daily.  ? VITAMIN E PO Take 1 tablet by mouth daily at 6 (six) AM. Unknown strength  ? ?Current Facility-Administered Medications for the 03/05/22 encounter (Office Visit) with Park Liter, MD  ?Medication  ? ipratropium-albuterol (DUONEB) 0.5-2.5 (3) MG/3ML nebulizer solution 3 mL  ?  ? ?Allergies:   Codeine  ? ?Social History  ? ?Socioeconomic History  ? Marital status: Legally Separated  ?  Spouse name: Not on file  ? Number of children: 2  ? Years of education: Not on file  ? Highest education level: Not on file  ?Occupational History  ? Occupation: [employer]  ?Tobacco Use  ? Smoking status: Every Day  ?  Packs/day: 1.00  ?  Types: Cigarettes  ? Smokeless tobacco: Never  ?Vaping Use  ? Vaping Use: Never used  ?Substance and Sexual Activity  ? Alcohol use: Not Currently  ?  Comment: Drinks approximately two times per week.  ? Drug use: Never  ? Sexual activity: Not on file  ?Other Topics Concern  ? Not on file  ?Social History Narrative  ? Not on file  ? ?Social Determinants of Health  ? ?Financial Resource Strain: Not on file  ?Food Insecurity: Not on file  ?Transportation Needs: Not on file  ?Physical Activity: Not on file  ?Stress: Not on file  ?Social Connections: Not on file  ?  ? ?Family History: ?The patient's family history includes Cancer in her brother and sister; Lung cancer in her brother and father; Suicidality in her mother. ?ROS:   ?Please see  the history of present illness.    ?All 14 point review of systems negative except as described per history of present illness ? ?EKGs/Labs/Other Studies Reviewed:   ? ? ? ?Recent Labs: ?04/11/2021: Magnesium 1.9 ?10/16/2021: TSH 3.110 ?01/08/2022: ALT 12; BUN 11; Creatinine, Ser 0.68; Hemoglobin 11.4; Platelets 414; Potassium 3.8; Sodium 141  ?Recent Lipid Panel ?   ?Component Value Date/Time  ? CHOL 150 10/16/2021 1009  ? TRIG 112 10/16/2021 1009  ? HDL 66 10/16/2021 1009  ? CHOLHDL 2.3 10/16/2021 1009  ? [place] 64 10/16/2021 1009  ? ? ?Physical Exam:   ? ?VS:  BP 140/64 (BP Location: Right Arm, Patient Position: Sitting)   Pulse 75   Ht '5\' 5"'$  (1.651 m)   Wt 142 lb 12.8 oz (64.8 kg)   SpO2 98%  BMI 23.76 kg/m?    ? ?Wt Readings from Last 3 Encounters:  ?03/05/22 142 lb 12.8 oz (64.8 kg)  ?01/08/22 141 lb 3.2 oz (64 kg)  ?12/30/21 138 lb (62.6 kg)  ?  ? ?GEN:  Well nourished, well developed in no acute distress ?HEENT: Normal ?NECK: No JVD; No carotid bruits ?LYMPHATICS: No lymphadenopathy ?CARDIAC: RRR, no murmurs, no rubs, no gallops ?RESPIRATORY:  Clear to auscultation without rales, wheezing or rhonchi  ?ABDOMEN: Soft, non-tender, non-distended ?MUSCULOSKELETAL:  No edema; No deformity  ?SKIN: Warm and dry ?LOWER EXTREMITIES: no swelling ?NEUROLOGIC:  Alert and oriented x 3 ?PSYCHIATRIC:  Normal affect  ? ?ASSESSMENT:   ? ?1. Primary hypertension   ?2. Atypical chest pain   ?3. Smoking   ?4. Mixed hyperlipidemia   ? ?PLAN:   ? ?In order of problems listed above: ? ?Essential hypertension blood pressure well controlled continue present management.  Systolic still be elevated in the office but she tells me at home always 093A to 355D systolic. ?Atypical chest pain denies having any coronary CT and showed normal coronaries ?Smoking still a problem but she smokes much less only 1 pack/day or less compared to previously 2 packs/day ?Dyslipidemia I did review K PN which show me data from 10/16/2021 with LDL 64 HDL  66.  Continue present management which include statin ? ? ?Medication Adjustments/Labs and Tests Ordered: ?Current medicines are reviewed at length with the patient today.  Concerns regarding medicines are outlined

## 2022-03-12 ENCOUNTER — Other Ambulatory Visit: Payer: Self-pay | Admitting: Physician Assistant

## 2022-03-12 DIAGNOSIS — J3089 Other allergic rhinitis: Secondary | ICD-10-CM

## 2022-03-27 ENCOUNTER — Other Ambulatory Visit: Payer: Self-pay | Admitting: Physician Assistant

## 2022-04-10 ENCOUNTER — Other Ambulatory Visit: Payer: Self-pay | Admitting: Physician Assistant

## 2022-04-10 DIAGNOSIS — J3089 Other allergic rhinitis: Secondary | ICD-10-CM

## 2022-04-15 ENCOUNTER — Ambulatory Visit (INDEPENDENT_AMBULATORY_CARE_PROVIDER_SITE_OTHER): Payer: 59 | Admitting: Physician Assistant

## 2022-04-15 ENCOUNTER — Encounter: Payer: Self-pay | Admitting: Physician Assistant

## 2022-04-15 VITALS — BP 122/72 | HR 68 | Temp 97.9°F | Resp 16 | Ht 65.0 in | Wt 144.2 lb

## 2022-04-15 DIAGNOSIS — E038 Other specified hypothyroidism: Secondary | ICD-10-CM | POA: Diagnosis not present

## 2022-04-15 DIAGNOSIS — Z23 Encounter for immunization: Secondary | ICD-10-CM

## 2022-04-15 DIAGNOSIS — J440 Chronic obstructive pulmonary disease with acute lower respiratory infection: Secondary | ICD-10-CM | POA: Diagnosis not present

## 2022-04-15 DIAGNOSIS — E782 Mixed hyperlipidemia: Secondary | ICD-10-CM

## 2022-04-15 DIAGNOSIS — F419 Anxiety disorder, unspecified: Secondary | ICD-10-CM

## 2022-04-15 DIAGNOSIS — I10 Essential (primary) hypertension: Secondary | ICD-10-CM | POA: Diagnosis not present

## 2022-04-15 DIAGNOSIS — D229 Melanocytic nevi, unspecified: Secondary | ICD-10-CM

## 2022-04-15 DIAGNOSIS — K219 Gastro-esophageal reflux disease without esophagitis: Secondary | ICD-10-CM

## 2022-04-15 DIAGNOSIS — J309 Allergic rhinitis, unspecified: Secondary | ICD-10-CM | POA: Insufficient documentation

## 2022-04-15 DIAGNOSIS — J3089 Other allergic rhinitis: Secondary | ICD-10-CM

## 2022-04-15 DIAGNOSIS — E559 Vitamin D deficiency, unspecified: Secondary | ICD-10-CM

## 2022-04-15 MED ORDER — EZETIMIBE 10 MG PO TABS: ORAL_TABLET | ORAL | 5 refills | Status: DC

## 2022-04-15 MED ORDER — ATENOLOL 25 MG PO TABS: 25.0000 mg | ORAL_TABLET | Freq: Two times a day (BID) | ORAL | 5 refills | Status: DC

## 2022-04-15 MED ORDER — OMEPRAZOLE 20 MG PO CPDR: DELAYED_RELEASE_CAPSULE | ORAL | 5 refills | Status: DC

## 2022-04-15 MED ORDER — CETIRIZINE HCL 10 MG PO TABS: 10.0000 mg | ORAL_TABLET | Freq: Every day | ORAL | 5 refills | Status: DC

## 2022-04-15 MED ORDER — ALPRAZOLAM 0.5 MG PO TABS: ORAL_TABLET | ORAL | 1 refills | Status: DC

## 2022-04-15 NOTE — Assessment & Plan Note (Signed)
Referral to dermatology.  

## 2022-04-15 NOTE — Assessment & Plan Note (Signed)
Continue meds. 

## 2022-04-15 NOTE — Assessment & Plan Note (Signed)
labwork pending ?Continue meds ?

## 2022-04-15 NOTE — Assessment & Plan Note (Signed)
Continue current meds and efforts at smoking cessation ?

## 2022-04-15 NOTE — Assessment & Plan Note (Signed)
TSH pending ?Continue meds ?

## 2022-04-15 NOTE — Progress Notes (Signed)
? ?Subjective:  ?Patient ID: Lori Wise, female    DOB: 1961/09/14  Age: 61 y.o. MRN: [id number] ? ?Chief Complaint  ?Patient presents with  ? Hypertension  ? Multiple Moles on Back  ? ? ?HPI ? Pt presents for follow up of hypertension. The patient is tolerating the medication well without side effects. Compliance with treatment has been good; including taking medication as directed , maintains a healthy diet and regular exercise regimen , and following up as directed. She is currently on tenormin '25mg'$  1 po bid ? ?Pt with history asthma/COPD - she has decreased her smoking to 1/2 ppd - is having no problems with cough/congestion or wheezing ?Currently uses symbicort and albuterol - does have duoneb for nebulzier as well ? ?Pt with history of gerd - sympotms stable on prilosec '20mg'$  qd - requests refill ? ?Mixed hyperlipidemia  ?Pt presents with hyperlipidemia.  The patient is compliant with medications, maintains a low cholesterol diet , follows up as directed , and maintains an exercise regimen . The patient denies experiencing any hypercholesterolemia related symptoms. Pt currently on crestor '5mg'$  qd and zetia '10mg'$  qd ? ?Pt with history of hypothyroidism - due for labwork - taking synthroid 87mg qd - voices no concerns ? ?Pt with history of anxiety - symptoms stable on lexapro and alprazolam that she takes as needed ? ?Pt has several moles on her back - would like to be referred to dermatology for skin exam ? ?Pt would like to update pneumo vaccine ? ?Current Outpatient Medications on File Prior to Visit  ?Medication Sig Dispense Refill  ? albuterol (VENTOLIN HFA) 108 (90 Base) MCG/ACT inhaler Inhale 2 puffs into the lungs every 6 (six) hours as needed for wheezing or shortness of breath. 8 g 2  ? Ascorbic Acid (VITAMIN C PO) Take 1 tablet by mouth daily. Unknown strength    ? b complex vitamins capsule Take 1 capsule by mouth daily.    ? budesonide-formoterol (SYMBICORT) 160-4.5 MCG/ACT inhaler Inhale 2 puffs into  the lungs 2 (two) times daily. 10.2 g 3  ? Cyanocobalamin (VITAMIN B-12 PO) Take 1 tablet by mouth daily. Unknown strenght    ? escitalopram (LEXAPRO) 10 MG tablet Take 1 tablet (10 mg total) by mouth daily. 30 tablet 2  ? estradiol (ESTRACE) 0.5 MG tablet Take 1 tablet (0.5 mg total) by mouth daily. 90 tablet 1  ? ibuprofen (ADVIL) 800 MG tablet TAKE ONE TABLET BY MOUTH EVERY 8 HOURS AS NEEDED FOR PAIN (Patient taking differently: Take by mouth every 8 (eight) hours as needed for mild pain or moderate pain.) 60 tablet 2  ? ipratropium-albuterol (DUONEB) 0.5-2.5 (3) MG/3ML SOLN USE 1 VIAL IN NEBULIZER EVERY 4 TO 6 HOURS AS NEEDED (Patient taking differently: Inhale 3 mLs into the lungs every 4 (four) hours as needed (SOB). USE 1 VIAL IN NEBULIZER EVERY 4 TO 6 HOURS AS NEEDED) 360 mL 0  ? levothyroxine (SYNTHROID) 25 MCG tablet TAKE ONE TABLET BY MOUTH EVERY DAY 30 tablet 1  ? Omega-3 1000 MG CAPS Take 1 capsule by mouth daily.    ? oxymetazoline (AFRIN) 0.05 % nasal spray Place 1 spray into both nostrils 2 (two) times daily.    ? rosuvastatin (CRESTOR) 5 MG tablet Take 1 tablet (5 mg total) by mouth daily. 90 tablet 3  ? VITAMIN E PO Take 1 tablet by mouth daily at 6 (six) AM. Unknown strength    ? ?Current Facility-Administered Medications on File Prior to Visit  ?  Medication Dose Route Frequency Provider Last Rate Last Admin  ? ipratropium-albuterol (DUONEB) 0.5-2.5 (3) MG/3ML nebulizer solution 3 mL  3 mL Nebulization Once Rip Harbour, NP      ? ?Past Medical History:  ?Diagnosis Date  ? Acute laryngopharyngitis 04/19/2020  ? Anxiety 04/19/2020  ? Atypical nevus 04/19/2020  ? Bronchitis 04/30/2020  ? Chronic obstructive pulmonary disease ([place]) 10/22/2020  ? Chronic obstructive pulmonary disease with (acute) lower respiratory infection ([place])   ? Encounter for immunization 04/19/2020  ? Essential (primary) hypertension   ? Gastroesophageal reflux disease 10/22/2020  ? Hormone replacement therapy (HRT) 10/22/2020  ?  Migraine without aura, not intractable, without status migrainosus   ? Mixed hyperlipidemia   ? Other chest pain 03/21/2021  ? Other specified hypothyroidism 04/19/2020  ? Primary hypertension 03/21/2021  ? Right leg pain 07/23/2020  ? Sunburn of second degree   ? ?Past Surgical History:  ?Procedure Laterality Date  ? ABDOMINAL HYSTERECTOMY    ? CARDIAC CATHETERIZATION  2000  ? normal  ? CHOLECYSTECTOMY    ? NASAL HEMORRHAGE CONTROL    ? OTHER SURGICAL HISTORY    ? Ear surgeries x5  ?  ?Family History  ?Problem Relation Age of Onset  ? Suicidality Mother   ? Lung cancer Father   ? Cancer Brother   ?     Brain Cancer  ? Cancer Sister   ?     Cell-1  ? Lung cancer Brother   ? ?Social History  ? ?Socioeconomic History  ? Marital status: Legally Separated  ?  Spouse name: Not on file  ? Number of children: 2  ? Years of education: Not on file  ? Highest education level: Not on file  ?Occupational History  ? Occupation: [employer]  ?Tobacco Use  ? Smoking status: Every Day  ?  Packs/day: 1.00  ?  Types: Cigarettes  ? Smokeless tobacco: Never  ?Vaping Use  ? Vaping Use: Never used  ?Substance and Sexual Activity  ? Alcohol use: Not Currently  ?  Comment: Drinks approximately two times per week.  ? Drug use: Never  ? Sexual activity: Not on file  ?Other Topics Concern  ? Not on file  ?Social History Narrative  ? Not on file  ? ?Social Determinants of Health  ? ?Financial Resource Strain: Not on file  ?Food Insecurity: Not on file  ?Transportation Needs: Not on file  ?Physical Activity: Not on file  ?Stress: Not on file  ?Social Connections: Not on file  ? ? ?Review of Systems ?CONSTITUTIONAL: Negative for chills, fatigue, fever, unintentional weight gain and unintentional weight loss.  ?E/N/T: Negative for ear pain, nasal congestion and sore throat.  ?CARDIOVASCULAR: Negative for chest pain, dizziness, palpitations and pedal edema.  ?RESPIRATORY: Negative for recent cough and dyspnea.  ?GASTROINTESTINAL: Negative for  abdominal pain, acid reflux symptoms, constipation, diarrhea, nausea and vomiting.  ?MSK: Negative for arthralgias and myalgias.  ?INTEGUMENTARY: see HPI ?NEUROLOGICAL: Negative for dizziness and headaches.  ?PSYCHIATRIC: Negative for sleep disturbance and to question depression screen.  Negative for depression, negative for anhedonia.  ?   ? ? ?Objective:  ?PHYSICAL EXAM:  ? ?VS: BP 122/72   Pulse 68   Temp 97.9 ?F (36.6 ?C)   Resp 16   Ht '5\' 5"'$  (1.651 m)   Wt 144 lb 3.2 oz (65.4 kg)   SpO2 97%   BMI 24.00 kg/m?  ? ?GEN: Well nourished, well developed, in no acute distress  ?Cardiac:  RRR; no murmurs, rubs, or gallops,no edema -  ?Respiratory:  normal respiratory rate and pattern with no distress - normal breath sounds with no rales, rhonchi, wheezes or rubs ? ?MS: no deformity or atrophy  ?Skin: numerous seborrheic keratoses and moles noted on back/trunk ? ?Psych: euthymic mood, appropriate affect and demeanor ? ? ?Lab Results  ?Component Value Date  ? WBC 11.7 (H) 01/08/2022  ? HGB 11.4 01/08/2022  ? HCT 34.2 01/08/2022  ? PLT 414 01/08/2022  ? GLUCOSE 141 (H) 01/08/2022  ? CHOL 150 10/16/2021  ? TRIG 112 10/16/2021  ? HDL 66 10/16/2021  ? [place] 64 10/16/2021  ? ALT 12 01/08/2022  ? AST 14 01/08/2022  ? NA 141 01/08/2022  ? K 3.8 01/08/2022  ? CL 107 (H) 01/08/2022  ? CREATININE 0.68 01/08/2022  ? BUN 11 01/08/2022  ? CO2 20 01/08/2022  ? TSH 3.110 10/16/2021  ? ? ? ? ?Assessment & Plan:  ? ?Problem List Items Addressed This Visit   ? ?  ? Cardiovascular and Mediastinum  ? Primary hypertension  ?  labwork pending ?Continue meds ? ?  ?  ? Relevant Medications  ? ezetimibe (ZETIA) 10 MG tablet  ? atenolol (TENORMIN) 25 MG tablet  ? Other Relevant Orders  ? CBC with Differential/Platelet  ? Comprehensive metabolic panel  ?  ? Respiratory  ? Chronic obstructive pulmonary disease with (acute) lower respiratory infection ([place]) - Primary  ?  Continue current meds and efforts at smoking cessation ? ?  ?  ?  Relevant Medications  ? oxymetazoline (AFRIN) 0.05 % nasal spray  ? cetirizine (ZYRTEC) 10 MG tablet  ? Allergic rhinitis due to allergen  ?  Continue meds ? ?  ?  ? Relevant Medications  ? cetirizine (ZYRTEC) 1

## 2022-04-15 NOTE — Assessment & Plan Note (Signed)
Continue meds ?Watch diet ?labwork pending ?

## 2022-04-15 NOTE — Assessment & Plan Note (Signed)
Prevnar 20 given ?

## 2022-04-16 LAB — CBC WITH DIFFERENTIAL/PLATELET
Basophils Absolute: 0.1 10*3/uL (ref 0.0–0.2)
Basos: 1 %
EOS (ABSOLUTE): 0.3 10*3/uL (ref 0.0–0.4)
Eos: 2 %
Hematocrit: 40.8 % (ref 34.0–46.6)
Hemoglobin: 13.8 g/dL (ref 11.1–15.9)
Immature Grans (Abs): 0.1 10*3/uL (ref 0.0–0.1)
Immature Granulocytes: 1 %
Lymphocytes Absolute: 4.7 10*3/uL — ABNORMAL HIGH (ref 0.7–3.1)
Lymphs: 39 %
MCH: 30.1 pg (ref 26.6–33.0)
MCHC: 33.8 g/dL (ref 31.5–35.7)
MCV: 89 fL (ref 79–97)
Monocytes Absolute: 1 10*3/uL — ABNORMAL HIGH (ref 0.1–0.9)
Monocytes: 8 %
Neutrophils Absolute: 5.8 10*3/uL (ref 1.4–7.0)
Neutrophils: 49 %
Platelets: 341 10*3/uL (ref 150–450)
RBC: 4.58 x10E6/uL (ref 3.77–5.28)
RDW: 12.1 % (ref 11.7–15.4)
WBC: 11.8 10*3/uL — ABNORMAL HIGH (ref 3.4–10.8)

## 2022-04-16 LAB — COMPREHENSIVE METABOLIC PANEL
ALT: 12 IU/L (ref 0–32)
AST: 20 IU/L (ref 0–40)
Albumin/Globulin Ratio: 2 (ref 1.2–2.2)
Albumin: 4.5 g/dL (ref 3.8–4.8)
Alkaline Phosphatase: 78 IU/L (ref 44–121)
BUN/Creatinine Ratio: 16 (ref 12–28)
BUN: 12 mg/dL (ref 8–27)
Bilirubin Total: 0.3 mg/dL (ref 0.0–1.2)
CO2: 19 mmol/L — ABNORMAL LOW (ref 20–29)
Calcium: 9.7 mg/dL (ref 8.7–10.3)
Chloride: 105 mmol/L (ref 96–106)
Creatinine, Ser: 0.77 mg/dL (ref 0.57–1.00)
Globulin, Total: 2.3 g/dL (ref 1.5–4.5)
Glucose: 97 mg/dL (ref 70–99)
Potassium: 4.3 mmol/L (ref 3.5–5.2)
Sodium: 139 mmol/L (ref 134–144)
Total Protein: 6.8 g/dL (ref 6.0–8.5)
eGFR: 87 mL/min/{1.73_m2} (ref >59)

## 2022-04-16 LAB — LIPID PANEL
Chol/HDL Ratio: 2.6 ratio (ref 0.0–4.4)
Cholesterol, Total: 151 mg/dL (ref 100–199)
HDL: 57 mg/dL (ref >39)
LDL Chol Calc (NIH): 69 mg/dL (ref 0–99)
Triglycerides: 149 mg/dL (ref 0–149)
VLDL Cholesterol Cal: 25 mg/dL (ref 5–40)

## 2022-04-16 LAB — TSH: TSH: 2.34 u[IU]/mL (ref 0.450–4.500)

## 2022-04-16 LAB — CARDIOVASCULAR RISK ASSESSMENT

## 2022-04-16 LAB — VITAMIN D 25 HYDROXY (VIT D DEFICIENCY, FRACTURES): Vit D, 25-Hydroxy: 38.8 ng/mL (ref 30.0–100.0)

## 2022-05-06 ENCOUNTER — Encounter: Payer: Self-pay | Admitting: Physician Assistant

## 2022-05-06 ENCOUNTER — Ambulatory Visit (INDEPENDENT_AMBULATORY_CARE_PROVIDER_SITE_OTHER): Payer: 59 | Admitting: Physician Assistant

## 2022-05-06 VITALS — BP 138/78 | HR 61 | Temp 98.3°F | Ht 65.0 in | Wt 143.0 lb

## 2022-05-06 DIAGNOSIS — L309 Dermatitis, unspecified: Secondary | ICD-10-CM | POA: Diagnosis not present

## 2022-05-06 MED ORDER — TRIAMCINOLONE ACETONIDE 40 MG/ML IJ SUSP
60.0000 mg | Freq: Once | INTRAMUSCULAR | Status: AC
  Administered 2022-05-06: 60 mg via INTRAMUSCULAR

## 2022-05-06 MED ORDER — PREDNISONE 20 MG PO TABS: ORAL_TABLET | ORAL | 0 refills | Status: AC

## 2022-05-06 NOTE — Progress Notes (Signed)
Acute Office Visit  Subjective:    Patient ID: Lori Wise, female    DOB: 04-25-1961, 61 y.o.   MRN: [id number]  Chief Complaint  Patient presents with   Blisters    On backs of ears/face/arms    HPI: Patient is in today for complaints of blisters on hands/arms and ears - was worse a few days ago - started after riding motorcycle and being out in sun Did use new sunscreen on hands/face and ears Blisters actually improving - now having some itching  Past Medical History:  Diagnosis Date   Acute laryngopharyngitis 04/19/2020   Anxiety 04/19/2020   Atypical nevus 04/19/2020   Bronchitis 04/30/2020   Chronic obstructive pulmonary disease ([place]) 10/22/2020   Chronic obstructive pulmonary disease with (acute) lower respiratory infection ([place])    Encounter for immunization 04/19/2020   Essential (primary) hypertension    Gastroesophageal reflux disease 10/22/2020   Hormone replacement therapy (HRT) 10/22/2020   Migraine without aura, not intractable, without status migrainosus    Mixed hyperlipidemia    Other chest pain 03/21/2021   Other specified hypothyroidism 04/19/2020   Primary hypertension 03/21/2021   Right leg pain 07/23/2020   Sunburn of second degree     Past Surgical History:  Procedure Laterality Date   ABDOMINAL HYSTERECTOMY     CARDIAC CATHETERIZATION  2000   normal   CHOLECYSTECTOMY     NASAL HEMORRHAGE CONTROL     OTHER SURGICAL HISTORY     Ear surgeries x5    Family History  Problem Relation Age of Onset   Suicidality Mother    Lung cancer Father    Cancer Brother        Brain Cancer   Cancer Sister        Cell-1   Lung cancer Brother     Social History   Socioeconomic History   Marital status: Legally Separated    Spouse name: Not on file   Number of children: 2   Years of education: Not on file   Highest education level: Not on file  Occupational History   Occupation: [employer]  Tobacco Use   Smoking status: Every Day     Packs/day: 1.00    Types: Cigarettes   Smokeless tobacco: Never  Vaping Use   Vaping Use: Never used  Substance and Sexual Activity   Alcohol use: Not Currently    Comment: Drinks approximately two times per week.   Drug use: Never   Sexual activity: Not on file  Other Topics Concern   Not on file  Social History Narrative   Not on file   Social Determinants of Health   Financial Resource Strain: Not on file  Food Insecurity: Not on file  Transportation Needs: Not on file  Physical Activity: Not on file  Stress: Not on file  Social Connections: Not on file  Intimate Partner Violence: Not on file    Outpatient Medications Prior to Visit  Medication Sig Dispense Refill   albuterol (VENTOLIN HFA) 108 (90 Base) MCG/ACT inhaler Inhale 2 puffs into the lungs every 6 (six) hours as needed for wheezing or shortness of breath. 8 g 2   ALPRAZolam (XANAX) 0.5 MG tablet TAKE ONE TABLET BY MOUTH 3 TIMES DAILY 90 tablet 1   Ascorbic Acid (VITAMIN C PO) Take 1 tablet by mouth daily. Unknown strength     atenolol (TENORMIN) 25 MG tablet Take 1 tablet (25 mg total) by mouth 2 (two) times daily. 60 tablet  5   b complex vitamins capsule Take 1 capsule by mouth daily.     budesonide-formoterol (SYMBICORT) 160-4.5 MCG/ACT inhaler Inhale 2 puffs into the lungs 2 (two) times daily. 10.2 g 3   cetirizine (ZYRTEC) 10 MG tablet Take 1 tablet (10 mg total) by mouth daily. 30 tablet 5   Cyanocobalamin (VITAMIN B-12 PO) Take 1 tablet by mouth daily. Unknown strenght     escitalopram (LEXAPRO) 10 MG tablet Take 1 tablet (10 mg total) by mouth daily. 30 tablet 2   estradiol (ESTRACE) 0.5 MG tablet Take 1 tablet (0.5 mg total) by mouth daily. 90 tablet 1   ezetimibe (ZETIA) 10 MG tablet 1 po qd 30 tablet 5   ibuprofen (ADVIL) 800 MG tablet TAKE ONE TABLET BY MOUTH EVERY 8 HOURS AS NEEDED FOR PAIN (Patient taking differently: Take by mouth every 8 (eight) hours as needed for mild pain or moderate pain.) 60  tablet 2   ipratropium-albuterol (DUONEB) 0.5-2.5 (3) MG/3ML SOLN USE 1 VIAL IN NEBULIZER EVERY 4 TO 6 HOURS AS NEEDED (Patient taking differently: Inhale 3 mLs into the lungs every 4 (four) hours as needed (SOB). USE 1 VIAL IN NEBULIZER EVERY 4 TO 6 HOURS AS NEEDED) 360 mL 0   levothyroxine (SYNTHROID) 25 MCG tablet TAKE ONE TABLET BY MOUTH EVERY DAY 30 tablet 1   Omega-3 1000 MG CAPS Take 1 capsule by mouth daily.     omeprazole (PRILOSEC) 20 MG capsule 1 po qd 30 capsule 5   oxymetazoline (AFRIN) 0.05 % nasal spray Place 1 spray into both nostrils 2 (two) times daily.     VITAMIN E PO Take 1 tablet by mouth daily at 6 (six) AM. Unknown strength     rosuvastatin (CRESTOR) 5 MG tablet Take 1 tablet (5 mg total) by mouth daily. 90 tablet 3   Facility-Administered Medications Prior to Visit  Medication Dose Route Frequency Provider Last Rate Last Admin   ipratropium-albuterol (DUONEB) 0.5-2.5 (3) MG/3ML nebulizer solution 3 mL  3 mL Nebulization Once Rip Harbour, NP        Allergies  Allergen Reactions   Codeine Hives, Itching, Other (See Comments), Rash and Nausea And Vomiting    Review of Systems CONSTITUTIONAL: Negative for chills, fatigue, fever, E/N/T: Negative for ear pain, nasal congestion and sore throat.  CARDIOVASCULAR: Negative for chest pain, dizziness, palpitations  RESPIRATORY: Negative for recent cough and dyspnea.  INTEGUMENTARY: ee HPI      Objective:    PHYSICAL EXAM:   VS: BP 138/78 (BP Location: Left Arm, Patient Position: Sitting)   Pulse 61   Temp 98.3 F (36.8 C) (Temporal)   Ht 5' 5" (1.651 m)   Wt 143 lb (64.9 kg)   SpO2 99%   BMI 23.80 kg/m   GEN: Well nourished, well developed, in no acute distress  Cardiac: RRR; no murmurs,  Respiratory:  normal respiratory rate and pattern with no distress - normal breath sounds with no rales, rhonchi, wheezes or rubs Skin: ears show slight erythema (pt had pics of blisters - those have healed) - pt with  some erythema/blisters on arms    There are no preventive care reminders to display for this patient.  There are no preventive care reminders to display for this patient.   Lab Results  Component Value Date   TSH 2.340 04/15/2022   Lab Results  Component Value Date   WBC 11.8 (H) 04/15/2022   HGB 13.8 04/15/2022   HCT 40.8 04/15/2022  MCV 89 04/15/2022   PLT 341 04/15/2022   Lab Results  Component Value Date   NA 139 04/15/2022   K 4.3 04/15/2022   CO2 19 (L) 04/15/2022   GLUCOSE 97 04/15/2022   BUN 12 04/15/2022   CREATININE 0.77 04/15/2022   BILITOT 0.3 04/15/2022   ALKPHOS 78 04/15/2022   AST 20 04/15/2022   ALT 12 04/15/2022   PROT 6.8 04/15/2022   ALBUMIN 4.5 04/15/2022   CALCIUM 9.7 04/15/2022   EGFR 87 04/15/2022   Lab Results  Component Value Date   CHOL 151 04/15/2022   Lab Results  Component Value Date   HDL 57 04/15/2022   Lab Results  Component Value Date   LDLCALC 69 04/15/2022   Lab Results  Component Value Date   TRIG 149 04/15/2022   Lab Results  Component Value Date   CHOLHDL 2.6 04/15/2022   No results found for: HGBA1C     Assessment & Plan:   Problem List Items Addressed This Visit   None Visit Diagnoses     Dermatitis    -  Primary   Relevant Medications   triamcinolone acetonide (KENALOG-40) injection 60 mg   predniSONE (DELTASONE) 20 MG tablet      Meds ordered this encounter  Medications   triamcinolone acetonide (KENALOG-40) injection 60 mg   predniSONE (DELTASONE) 20 MG tablet    Sig: Take 3 tablets (60 mg total) by mouth daily with breakfast for 3 days, THEN 2 tablets (40 mg total) daily with breakfast for 3 days, THEN 1 tablet (20 mg total) daily with breakfast for 3 days.    Dispense:  18 tablet    Refill:  0    Order Specific Question:   Supervising Provider    Answer:   [name]    No orders of the defined types were placed in this encounter.    Follow-up: No follow-ups on file.  An  After Visit Summary was printed and given to the patient.  Yetta Flock [facility] [phone]

## 2022-05-12 ENCOUNTER — Other Ambulatory Visit: Payer: Self-pay | Admitting: Physician Assistant

## 2022-05-12 DIAGNOSIS — F419 Anxiety disorder, unspecified: Secondary | ICD-10-CM

## 2022-05-27 ENCOUNTER — Other Ambulatory Visit: Payer: Self-pay | Admitting: Physician Assistant

## 2022-06-11 ENCOUNTER — Other Ambulatory Visit: Payer: Self-pay | Admitting: Physician Assistant

## 2022-06-11 DIAGNOSIS — F419 Anxiety disorder, unspecified: Secondary | ICD-10-CM

## 2022-07-10 ENCOUNTER — Other Ambulatory Visit: Payer: Self-pay | Admitting: Family Medicine

## 2022-07-10 DIAGNOSIS — F419 Anxiety disorder, unspecified: Secondary | ICD-10-CM

## 2022-07-25 ENCOUNTER — Other Ambulatory Visit: Payer: Self-pay | Admitting: Physician Assistant

## 2022-07-25 DIAGNOSIS — Z79890 Hormone replacement therapy (postmenopausal): Secondary | ICD-10-CM

## 2022-08-01 ENCOUNTER — Encounter: Payer: Self-pay | Admitting: Nurse Practitioner

## 2022-08-01 ENCOUNTER — Ambulatory Visit (INDEPENDENT_AMBULATORY_CARE_PROVIDER_SITE_OTHER): Payer: 59 | Admitting: Nurse Practitioner

## 2022-08-01 ENCOUNTER — Other Ambulatory Visit: Payer: Self-pay | Admitting: Nurse Practitioner

## 2022-08-01 VITALS — BP 128/72 | HR 68 | Temp 97.2°F | Ht 65.0 in | Wt 143.0 lb

## 2022-08-01 DIAGNOSIS — L578 Other skin changes due to chronic exposure to nonionizing radiation: Secondary | ICD-10-CM

## 2022-08-01 DIAGNOSIS — K219 Gastro-esophageal reflux disease without esophagitis: Secondary | ICD-10-CM | POA: Diagnosis not present

## 2022-08-01 DIAGNOSIS — R21 Rash and other nonspecific skin eruption: Secondary | ICD-10-CM

## 2022-08-01 MED ORDER — PREDNISONE 20 MG PO TABS: ORAL_TABLET | ORAL | 0 refills | Status: DC

## 2022-08-01 MED ORDER — TRIAMCINOLONE ACETONIDE 40 MG/ML IJ SUSP
60.0000 mg | Freq: Once | INTRAMUSCULAR | Status: AC
  Administered 2022-08-01: 60 mg via INTRAMUSCULAR

## 2022-08-01 MED ORDER — OMEPRAZOLE 20 MG PO CPDR: 20.0000 mg | DELAYED_RELEASE_CAPSULE | Freq: Two times a day (BID) | ORAL | 3 refills | Status: DC

## 2022-08-01 NOTE — Progress Notes (Signed)
Acute Office Visit  Subjective:    Patient ID: Lori Wise, female    DOB: 21-Nov-1961, 61 y.o.   MRN: [id number]  Chief Complaint  Patient presents with   Rash    HPI: Patient is in today for sun induced rash to right lower extremity. Onset was two days ago after driving her motorcycle home from [facility] wearing shorts. Pt states she has experienced sun induced dermatitis in the past. Reports she used sunscreen prior to exposure but had wiped off excess with towel, possibly removing some of applied lotion. Denies headache, fever, chills, or nausea. Treatment has included Aloe vera and calamine lotion with no relief. Pt states she had one "left over" prednisone tablet that she took, with some improvement.     Pt states she has chronic vertigo. She has asked for recommendations for cure. States she is scheduled to see ENT in a few months. Denies previously trying Epley Maneuvers.  Past Medical History:  Diagnosis Date   Acute laryngopharyngitis 04/19/2020   Anxiety 04/19/2020   Atypical nevus 04/19/2020   Bronchitis 04/30/2020   Chronic obstructive pulmonary disease ([place]) 10/22/2020   Chronic obstructive pulmonary disease with (acute) lower respiratory infection ([place])    Encounter for immunization 04/19/2020   Essential (primary) hypertension    Gastroesophageal reflux disease 10/22/2020   Hormone replacement therapy (HRT) 10/22/2020   Migraine without aura, not intractable, without status migrainosus    Mixed hyperlipidemia    Other chest pain 03/21/2021   Other specified hypothyroidism 04/19/2020   Primary hypertension 03/21/2021   Right leg pain 07/23/2020   Sunburn of second degree     Past Surgical History:  Procedure Laterality Date   ABDOMINAL HYSTERECTOMY     CARDIAC CATHETERIZATION  2000   normal   CHOLECYSTECTOMY     NASAL HEMORRHAGE CONTROL     OTHER SURGICAL HISTORY     Ear surgeries x5    Family History  Problem Relation Age of Onset   Suicidality Mother     Lung cancer Father    Cancer Brother        Brain Cancer   Cancer Sister        Cell-1   Lung cancer Brother     Social History   Socioeconomic History   Marital status: Legally Separated    Spouse name: Not on file   Number of children: 2   Years of education: Not on file   Highest education level: Not on file  Occupational History   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   Drug use: Never   Sexual activity: Not on file  Other Topics Concern   Not on file  Social History Narrative   Not on file   Social Determinants of Health   Financial Resource Strain: Not on file  Food Insecurity: Not on file  Transportation Needs: Not on file  Physical Activity: Not on file  Stress: Not on file  Social Connections: Not on file  Intimate Partner Violence: Not on file    Outpatient Medications Prior to Visit  Medication Sig Dispense Refill   albuterol (VENTOLIN HFA) 108 (90 Base) MCG/ACT inhaler Inhale 2 puffs into the lungs  every 6 (six) hours as needed for wheezing or shortness of breath. 8 g 2   ALPRAZolam (XANAX) 0.5 MG tablet TAKE ONE TABLET BY MOUTH 3 TIMES DAILY 90 tablet 0   Ascorbic Acid (VITAMIN C PO) Take 1 tablet by mouth daily. Unknown strength     atenolol (TENORMIN) 25 MG tablet Take 1 tablet (25 mg total) by mouth 2 (two) times daily. 60 tablet 5   b complex vitamins capsule Take 1 capsule by mouth daily.     budesonide-formoterol (SYMBICORT) 160-4.5 MCG/ACT inhaler Inhale 2 puffs into the lungs 2 (two) times daily. 10.2 g 3   cetirizine (ZYRTEC) 10 MG tablet Take 1 tablet (10 mg total) by mouth daily. 30 tablet 5   Cyanocobalamin (VITAMIN B-12 PO) Take 1 tablet by mouth daily. Unknown strenght     escitalopram  (LEXAPRO) 10 MG tablet Take 1 tablet (10 mg total) by mouth daily. 30 tablet 2   estradiol (ESTRACE) 0.5 MG tablet Take 1 tablet (0.5 mg total) by mouth daily. 90 tablet 1   ezetimibe (ZETIA) 10 MG tablet 1 po qd 30 tablet 5   ibuprofen (ADVIL) 800 MG tablet TAKE ONE TABLET BY MOUTH EVERY 8 HOURS AS NEEDED FOR PAIN (Patient taking differently: Take by mouth every 8 (eight) hours as needed for mild pain or moderate pain.) 60 tablet 2   ipratropium-albuterol (DUONEB) 0.5-2.5 (3) MG/3ML SOLN USE 1 VIAL IN NEBULIZER EVERY 4 TO 6 HOURS AS NEEDED (Patient taking differently: Inhale 3 mLs into the lungs every 4 (four) hours as needed (SOB). USE 1 VIAL IN NEBULIZER EVERY 4 TO 6 HOURS AS NEEDED) 360 mL 0   levothyroxine (SYNTHROID) 25 MCG tablet TAKE ONE TABLET BY MOUTH EVERY DAY 90 tablet 1   Omega-3 1000 MG CAPS Take 1 capsule by mouth daily.     omeprazole (PRILOSEC) 20 MG capsule 1 po qd 30 capsule 5   oxymetazoline (AFRIN) 0.05 % nasal spray Place 1 spray into both nostrils 2 (two) times daily.     rosuvastatin (CRESTOR) 5 MG tablet Take 1 tablet (5 mg total) by mouth daily. 90 tablet 3   VITAMIN E PO Take 1 tablet by mouth daily at 6 (six) AM. Unknown strength     No facility-administered medications prior to visit.    Allergies  Allergen Reactions   Codeine Hives, Itching, Other (See Comments), Rash and Nausea And Vomiting    Review of Systems See pertinent positives and negatives per HPI.     Objective:    Physical Exam Vitals reviewed.  Constitutional:      Appearance: Normal appearance.  Cardiovascular:     Rate and Rhythm: Normal rate and regular rhythm.     Pulses: Normal pulses.     Heart sounds: Normal heart sounds.  Skin:    Capillary Refill: Capillary refill takes less than 2 seconds.     Findings: Rash present. Rash is purpuric.       Neurological:     Mental Status: She is alert.     BP 128/72   Pulse 68   Temp (!) 97.2 F (36.2 C)   Ht _0  (1.651 m)   Wt  143 lb (64.9 kg)   SpO2 98%   BMI 23.80 kg/m  Wt Readings from Last 3 Encounters:  08/01/22 143 lb (64.9 kg)  05/06/22 143 lb (64.9 kg)  04/15/22 144 lb 3.2 oz (65.4 kg)    Health Maintenance Due  Topic Date Due  INFLUENZA VACCINE  07/08/2022    Lab Results  Component Value Date   TSH 2.340 04/15/2022   Lab Results  Component Value Date   WBC 11.8 (H) 04/15/2022   HGB 13.8 04/15/2022   HCT 40.8 04/15/2022   MCV 89 04/15/2022   PLT 341 04/15/2022   Lab Results  Component Value Date   NA 139 04/15/2022   K 4.3 04/15/2022   CO2 19 (L) 04/15/2022   GLUCOSE 97 04/15/2022   BUN 12 04/15/2022   CREATININE 0.77 04/15/2022   BILITOT 0.3 04/15/2022   ALKPHOS 78 04/15/2022   AST 20 04/15/2022   ALT 12 04/15/2022   PROT 6.8 04/15/2022   ALBUMIN 4.5 04/15/2022   CALCIUM 9.7 04/15/2022   EGFR 87 04/15/2022   Lab Results  Component Value Date   CHOL 151 04/15/2022   Lab Results  Component Value Date   HDL 57 04/15/2022   Lab Results  Component Value Date   LDLCALC 69 04/15/2022   Lab Results  Component Value Date   TRIG 149 04/15/2022   Lab Results  Component Value Date   CHOLHDL 2.6 04/15/2022        Assessment & Plan:    1. Dermatitis due to sun - predniSONE (DELTASONE) 20 MG tablet; Take 3 tablets (60 mg total) by mouth daily with breakfast for 3 days, THEN 2 tablets (40 mg total) daily with breakfast for 3 days, THEN 1 tablet (20 mg total) daily with breakfast for 3 days.  Dispense: 18 tablet; Refill: 0 - triamcinolone acetonide (KENALOG-40) injection 60 mg  2. GERD without esophagitis - omeprazole (PRILOSEC) 20 MG capsule; Take 1 capsule (20 mg total) by mouth 2 (two) times daily before a meal.  Dispense: 60 capsule; Refill: 3 -recommend pt increase Prilosec to BID while taking Prednisone taper   Take Prednisone taper as prescribed Take Omeprazole 20 mg twice daily while taking Prednisone Perform Epley maneuvers as directed for vertigo Follow-up  as needed    Follow-up: PRN  An After Visit Summary was printed and given to the patient.  I, Rip Harbour, NP, have reviewed all documentation for this visit. The documentation on 08/01/22 for the exam, diagnosis, procedures, and orders are all accurate and complete.   Signed, Rip Harbour, NP [place] [phone]

## 2022-08-01 NOTE — Patient Instructions (Addendum)
Take Prednisone taper as prescribed Take Omeprazole 20 mg twice daily while taking Prednisone Perform Epley maneuvers as directed for vertigo Follow-up as needed  How to Perform the Epley Maneuver The Epley maneuver is an exercise that relieves symptoms of vertigo. Vertigo is the feeling that you or your surroundings are moving when they are not. When you feel vertigo, you may feel like the room is spinning and may have trouble walking. The Epley maneuver is used for a type of vertigo caused by a calcium deposit in a part of the inner ear. The maneuver involves changing head positions to help the deposit move out of the area. You can do this maneuver at home whenever you have symptoms of vertigo. You can repeat it in 24 hours if your vertigo has not gone away. Even though the Epley maneuver may relieve your vertigo for a few weeks, it is possible that your symptoms will return. This maneuver relieves vertigo, but it does not relieve dizziness. What are the risks? If it is done correctly, the Epley maneuver is considered safe. Sometimes it can lead to dizziness or nausea that goes away after a short time. If you develop other symptoms--such as changes in vision, weakness, or numbness--stop doing the maneuver and call your health care provider. Supplies needed: A bed or table. A pillow. How to do the Epley maneuver     Sit on the edge of a bed or table with your back straight and your legs extended or hanging over the edge of the bed or table. Turn your head halfway toward the affected ear or side as told by your health care provider. Lie backward quickly with your head turned until you are lying flat on your back. Your head should dangle (head-hanging position). You may want to position a pillow under your shoulders. Hold this position for at least 30 seconds. If you feel dizzy or have symptoms of vertigo, continue to hold the position until the symptoms stop. Turn your head to the opposite  direction until your unaffected ear is facing down. Your head should continue to dangle. Hold this position for at least 30 seconds. If you feel dizzy or have symptoms of vertigo, continue to hold the position until the symptoms stop. Turn your whole body to the same side as your head so that you are positioned on your side. Your head will now be nearly facedown and no longer needs to dangle. Hold for at least 30 seconds. If you feel dizzy or have symptoms of vertigo, continue to hold the position until the symptoms stop. Sit back up. You can repeat the maneuver in 24 hours if your vertigo does not go away. Follow these instructions at home: For 24 hours after doing the Epley maneuver: Keep your head in an upright position. When lying down to sleep or rest, keep your head raised (elevated) with two or more pillows. Avoid excessive neck movements. Activity Do not drive or use machinery if you feel dizzy. After doing the Epley maneuver, return to your normal activities as told by your health care provider. Ask your health care provider what activities are safe for you. General instructions Drink enough fluid to keep your urine pale yellow. Do not drink alcohol. Take over-the-counter and prescription medicines only as told by your health care provider. Keep all follow-up visits. This is important. Preventing vertigo symptoms Ask your health care provider if there is anything you should do at home to prevent vertigo. He or she may recommend  that you: Keep your head elevated with two or more pillows while you sleep. Do not sleep on the side of your affected ear. Get up slowly from bed. Avoid sudden movements during the day. Avoid extreme head positions or movement, such as looking up or bending over. Contact a health care provider if: Your vertigo gets worse. You have other symptoms, including: Nausea. Vomiting. Headache. Get help right away if you: Have vision changes. Have a headache or  neck pain that is severe or getting worse. Cannot stop vomiting. Have new numbness or weakness in any part of your body. These symptoms may represent a serious problem that is an emergency. Do not wait to see if the symptoms will go away. Get medical help right away. Call your local emergency services (911 in the [place] Do not drive yourself to the hospital. Summary Vertigo is the feeling that you or your surroundings are moving when they are not. The Epley maneuver is an exercise that relieves symptoms of vertigo. If the Epley maneuver is done correctly, it is considered safe. This information is not intended to replace advice given to you by your health care provider. Make sure you discuss any questions you have with your health care provider. Document Revised: 10/24/2020 Document Reviewed: 10/24/2020 Else[employer] Patient Education  [place] sensitivity, also called photosensitivity, is a condition in which exposure to sunlight causes skin irritation. There are many types of sun sensitivity. Some medicines, skin products, and medical conditions can cause sun sensitivity that can range from mild to severe. If medicines or products are causing sun sensitivity, the condition may go away after you stop taking the medicine or using the product. In some cases, sun sensitivity is a long-term (chronic) condition. What are the causes? Common causes of sun sensitivity include: Certain medicines. Certain makeup products (cosmetics), lotions, or medicines that are applied to the skin. Some health conditions, including: Connective tissue diseases, such as lupus. Redness and inflammation of the face or eyes (rosacea). White patches of skin (vitiligo). In some cases, sun sensitivity may be passed from parent to child (inherited), or the cause may not be known. What increases the risk? This condition is more likely to develop in people who: Have family members who have sun  sensitivity. Are in sunlight for long periods of time. What are the signs or symptoms? Symptoms of sun sensitivity vary. Common symptoms include: Red or sunburned skin that may be bumpy or form blisters. Hives. Itchy skin (pruritus). Pain where skin is exposed to the sun. Dry, flaky skin. Swelling of the skin. Sunburn or other symptoms often develop after a short amount of sun exposure. How is this diagnosed? This condition is diagnosed based on your symptoms. It is important to talk with your health care provider about your symptoms, including: The timing of your symptoms, such as whether you have symptoms immediately after being in the sun, or after a long period of being in the sun. How long your symptoms last. Where your symptoms occur. Some types of sun sensitivity can be diagnosed with: A skin biopsy. This a test that involves taking a small sample of skin to be examined under a microscope. A blood test to check for certain medical conditions or genetic factors. Light testing to see how you react to UV (ultraviolet) light. This is the type of light that comes from the sun. How is this treated? This condition is often treated by preventing known causes of sun sensitivity.  This may include protecting your skin from the sun and avoiding certain skin products. Your health care provider may recommend that you take an antihistamine medicine or apply prescription cream to affected areas to help relieve symptoms. Follow these instructions at home: Do not break any blisters that you may have. Do not scratch your skin while you have symptoms. This can make symptoms worse. Apply a cool, wet cloth (cool compress) to affected areas. This may help to relieve symptoms. Do not apply ice to your affected skin. Icing can cause further damage. Take over-the-counter and prescription medicines only as told by your health care provider. How is this prevented?  Avoid skin products that cause symptoms,  such as certain cosmetics and lotions. Avoid being in the sun while taking medicines that are known to cause sun sensitivity. Ask your health care provider if any medicines that you take may cause sun sensitivity. Avoid the sun when it is strongest, usually between 10 a.m. and 4 p.m. Cover your skin with pants, long sleeves, and a hat when you are exposed to sunlight. Apply sunscreen 15-30 minutes before you go outside, even on cloudy days. Use a sunscreen with an SPF of 30 or higher. Use a waterproof or water-resistant sunscreen that protects against all of the sun's rays (broad-spectrum). Apply a thick coat of sunscreen over all exposed skin. Typically, a palm-size amount of sunscreen will cover your whole body. Do not use sunscreen on a baby who is younger than 20 months of age. Reapply sunscreen: About every 2 hours, whether it is sunny or cloudy. More often if you are sweating a lot. After swimming or playing in the water. Contact a health care provider if: Your symptoms do not improve with treatment. Your rash does not go away. Your pain or itching is not controlled with medicine. Your skin becomes more painful and swollen. You have a fever. You have open blisters. Get help right away if: You suddenly develop a rash as well as: Swelling around your eyes or lips. Trouble swallowing. Trouble breathing. You have bleeding underneath skin that was exposed to the sun. You have pus or fluid coming from any blisters. These symptoms may represent a serious problem that is an emergency. Do not wait to see if the symptoms will go away. Get medical help right away. Call your local emergency services (911 in the [place] Do not drive yourself to the hospital. Summary Sun sensitivity, also called photosensitivity, is a condition in which exposure to sunlight causes skin irritation. Some medicines, skin products, and medical conditions can cause sun sensitivity. Avoid being in the sun while taking  medicines that are known to cause sun sensitivity. Apply sunscreen 15-30 minutes before you go outside, even on cloudy days. Reapply sunscreen as directed. This information is not intended to replace advice given to you by your health care provider. Make sure you discuss any questions you have with your health care provider. Document Revised: 02/27/2021 Document Reviewed: 02/27/2021 Elsevier Patient Education  [place].

## 2022-08-07 ENCOUNTER — Other Ambulatory Visit: Payer: Self-pay | Admitting: Family Medicine

## 2022-08-07 DIAGNOSIS — F419 Anxiety disorder, unspecified: Secondary | ICD-10-CM

## 2022-08-14 ENCOUNTER — Other Ambulatory Visit: Payer: Self-pay | Admitting: Physician Assistant

## 2022-08-14 ENCOUNTER — Encounter: Payer: Self-pay | Admitting: Physician Assistant

## 2022-08-14 ENCOUNTER — Ambulatory Visit (INDEPENDENT_AMBULATORY_CARE_PROVIDER_SITE_OTHER): Payer: 59

## 2022-08-14 DIAGNOSIS — Z23 Encounter for immunization: Secondary | ICD-10-CM

## 2022-08-14 DIAGNOSIS — Z1231 Encounter for screening mammogram for malignant neoplasm of breast: Secondary | ICD-10-CM

## 2022-08-14 IMAGING — CT CT HEART MORP W/ CTA COR W/ SCORE W/ CA W/CM &/OR W/O CM
4 of 7 series · 8 of 20 positions shown, 9 images · IV contrast (APPLIED)
Comparison: None.

Addendum:
CLINICAL DATA: Chest pain

EXAM:
Cardiac/Coronary CTA
TECHNIQUE: A non-contrast, gated CT scan was obtained with axial slices of 3 mm
through the heart for calcium scoring. Calcium scoring was performed
using the Agatston method. A 100 kV prospective, gated, contrast
cardiac scan was obtained. Gantry rotation speed was 250 msecs and
collimation was 0.6 mm. Two sublingual nitroglycerin tablets (0.8
mg) were given. The 3D data set was reconstructed in 5% intervals of
the 35-75% of the R-R cycle. Diastolic phases were analyzed on a
dedicated workstation using MPR, MIP, and VRT modes. The patient
received 80 cc of contrast.

[Series 6: best diast 72 % · axial · 0.39mm/px · z∈[+1159,+1205]mm · 2 of 346 slices shown]
[im 116/346  vessel]
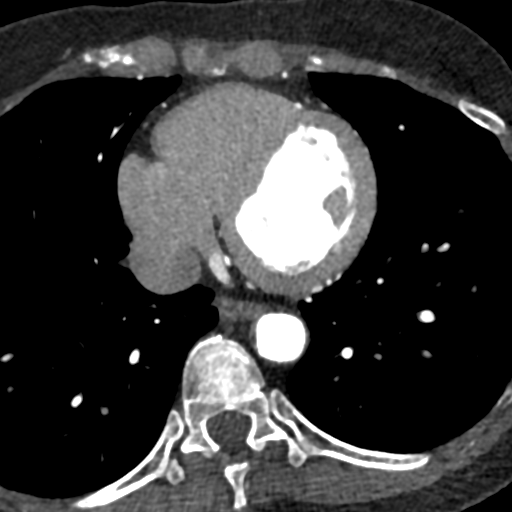
[im 231/346  vessel]
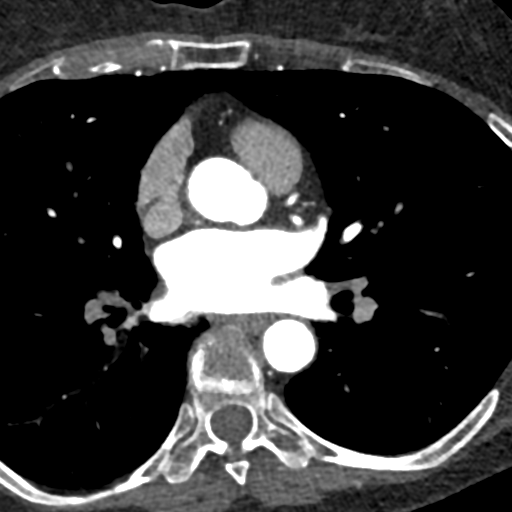

[Series 7: best syst · axial · 0.39mm/px · z∈[+1159,+1205]mm · 2 of 346 slices shown, 3 images]
[im 116/346  vessel]
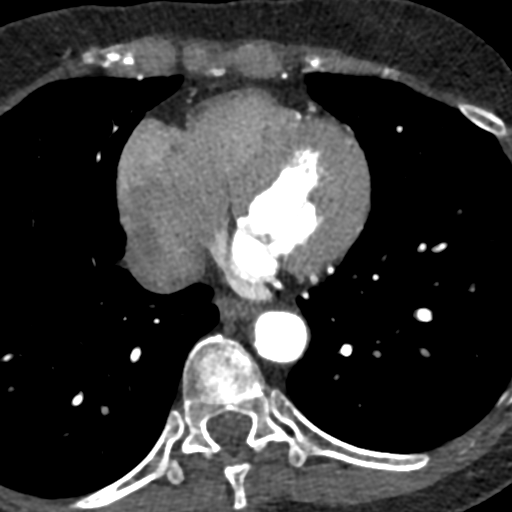
[im 116/346  lung]
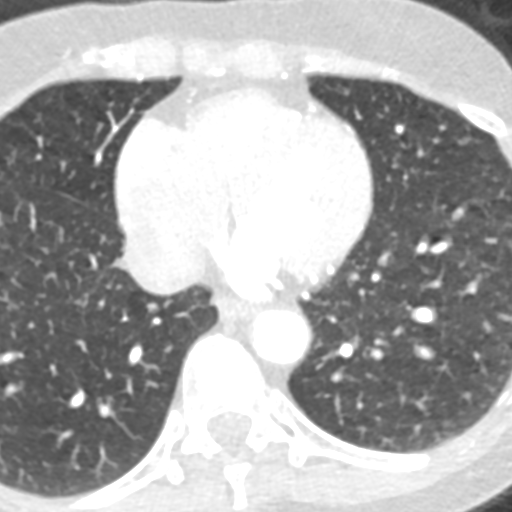
[im 231/346  vessel]
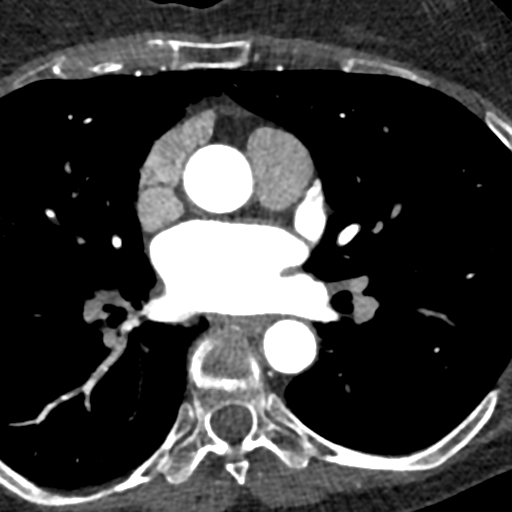

[Series 8: ts diast sharp 72 % · axial · 0.39mm/px · z∈[+1159,+1205]mm · 2 of 346 slices shown]
[im 116/346  lung]
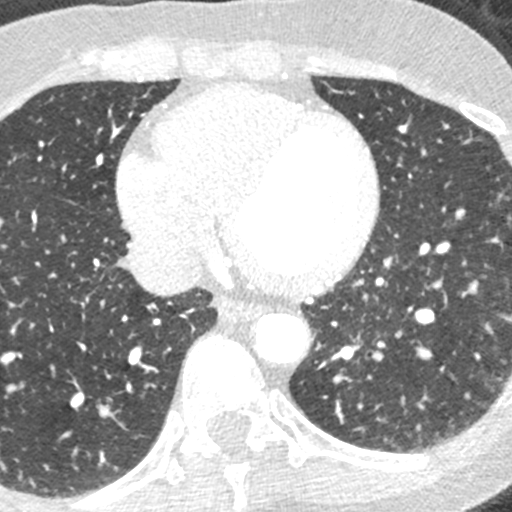
[im 231/346  lung]
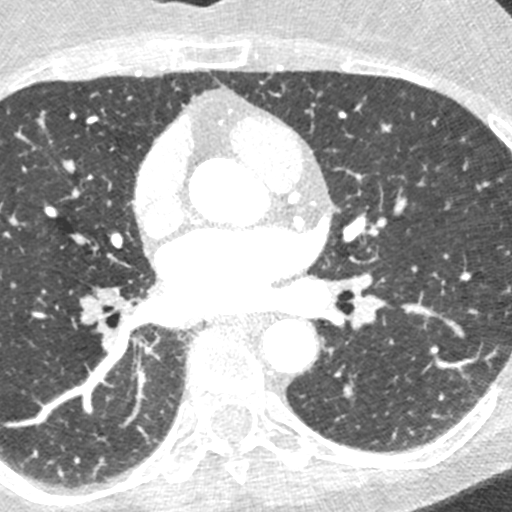

[Series 9: ts syst sharp 36 % · axial · 0.39mm/px · z∈[+1159,+1205]mm · 2 of 346 slices shown]
[im 116/346  lung]
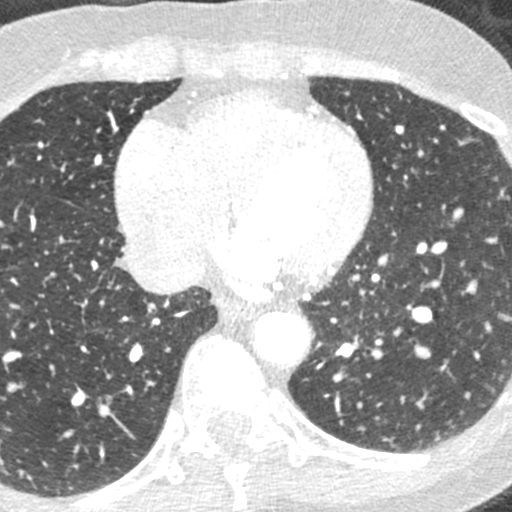
[im 231/346  lung]
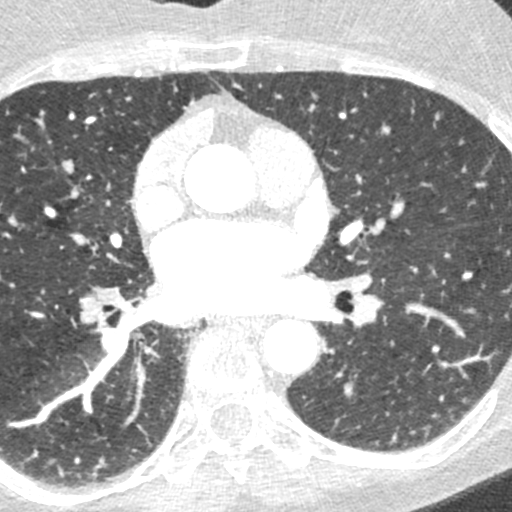

[8 of 20 positions shown; findings below may reference images not displayed]

FINDINGS: Image quality: Excellent.

Noise artifact is: Limited.

Coronary Arteries:  Normal coronary origin.  Left dominance.

Left main: The left main is a large caliber vessel with a normal
take off from the left coronary cusp that bifurcates to form a left
anterior descending artery and a left circumflex artery. There is no
plaque or stenosis.

Left anterior descending artery: The LAD is patent without evidence
of plaque or stenosis. The LAD gives off 1 patent diagonal branch.

Left circumflex artery: The LCX is dominant and patent with no
evidence of plaque or stenosis. The LCX gives off 2 patent branches,
terminates into PDA.

Right coronary artery: The RCA is small non-dominant. There is no
evidence of plaque or stenosis.

Right Atrium: Right atrial size is within normal limits.

Right Ventricle: The right ventricular cavity is within normal
limits.

Left Atrium: Left atrial size is normal in size with no left atrial
appendage filling defect.

Left Ventricle: The ventricular cavity size is within normal limits.

Pulmonary arteries: Normal in size without proximal filling defect.

Pulmonary veins: Normal pulmonary venous drainage.

Pericardium: Normal thickness with no significant effusion or
calcium present.

Cardiac valves: The aortic valve is trileaflet without significant
calcification. The mitral valve is normal structure without
significant calcification.

Aorta: Normal caliber with no significant disease. Descending aorta
atherosclerosis.

Extra-cardiac findings: See attached radiology report for
non-cardiac structures.
IMPRESSION: 1. Coronary calcium score of 0. This was 0 percentile for age and
sex matched controls.

2. Normal coronary origin with left dominance.

3. No evidence of CAD.

4. Descending aorta atherosclerosis.

RECOMMENDATIONS:
1. CAD-RADS 0: No evidence of CAD (0%). Consider non-atherosclerotic
causes of chest pain.

EXAM:
OVER-READ INTERPRETATION  CT CHEST

The following report is an over-read performed by radiologist Dr.
does not include interpretation of cardiac or coronary anatomy or
pathology. The coronary calcium score/coronary CTA interpretation by
the cardiologist is attached.
FINDINGS: The visualized portions of the lower lung fields show no suspicious
nodules, masses, or infiltrates. No pleural fluid seen.

The visualized portions of the mediastinum and chest wall are
unremarkable.
IMPRESSION: No significant non-cardiovascular abnormality in visualized portion
of the thorax.

*** End of Addendum ***
FINDINGS: Image quality: Excellent.

Noise artifact is: Limited.

Coronary Arteries:  Normal coronary origin.  Left dominance.

Left main: The left main is a large caliber vessel with a normal
take off from the left coronary cusp that bifurcates to form a left
anterior descending artery and a left circumflex artery. There is no
plaque or stenosis.

Left anterior descending artery: The LAD is patent without evidence
of plaque or stenosis. The LAD gives off 1 patent diagonal branch.

Left circumflex artery: The LCX is dominant and patent with no
evidence of plaque or stenosis. The LCX gives off 2 patent branches,
terminates into PDA.

Right coronary artery: The RCA is small non-dominant. There is no
evidence of plaque or stenosis.

Right Atrium: Right atrial size is within normal limits.

Right Ventricle: The right ventricular cavity is within normal
limits.

Left Atrium: Left atrial size is normal in size with no left atrial
appendage filling defect.

Left Ventricle: The ventricular cavity size is within normal limits.

Pulmonary arteries: Normal in size without proximal filling defect.

Pulmonary veins: Normal pulmonary venous drainage.

Pericardium: Normal thickness with no significant effusion or
calcium present.

Cardiac valves: The aortic valve is trileaflet without significant
calcification. The mitral valve is normal structure without
significant calcification.

Aorta: Normal caliber with no significant disease. Descending aorta
atherosclerosis.

Extra-cardiac findings: See attached radiology report for
non-cardiac structures.
IMPRESSION: 1. Coronary calcium score of 0. This was 0 percentile for age and
sex matched controls.

2. Normal coronary origin with left dominance.

3. No evidence of CAD.

4. Descending aorta atherosclerosis.

RECOMMENDATIONS:
1. CAD-RADS 0: No evidence of CAD (0%). Consider non-atherosclerotic
causes of chest pain.

## 2022-08-14 NOTE — Progress Notes (Signed)
Patient came in today for nurse visit for flu vaccine. Patient tolerated flu shot well.

## 2022-08-19 ENCOUNTER — Other Ambulatory Visit: Payer: Self-pay

## 2022-08-19 DIAGNOSIS — E782 Mixed hyperlipidemia: Secondary | ICD-10-CM

## 2022-08-19 MED ORDER — ROSUVASTATIN CALCIUM 5 MG PO TABS: 5.0000 mg | ORAL_TABLET | Freq: Every day | ORAL | 0 refills | Status: DC

## 2022-09-03 ENCOUNTER — Ambulatory Visit
Admission: RE | Admit: 2022-09-03 | Discharge: 2022-09-03 | Disposition: A | Discharge status: Home / Self Care | Payer: 59 | Source: Ambulatory Visit | Attending: Physician Assistant | Admitting: Physician Assistant

## 2022-09-03 DIAGNOSIS — Z1231 Encounter for screening mammogram for malignant neoplasm of breast: Secondary | ICD-10-CM | POA: Diagnosis not present

## 2022-09-04 ENCOUNTER — Other Ambulatory Visit: Payer: Self-pay | Admitting: Physician Assistant

## 2022-09-04 DIAGNOSIS — R928 Other abnormal and inconclusive findings on diagnostic imaging of breast: Secondary | ICD-10-CM

## 2022-09-05 ENCOUNTER — Other Ambulatory Visit: Payer: Self-pay | Admitting: Physician Assistant

## 2022-09-05 DIAGNOSIS — R928 Other abnormal and inconclusive findings on diagnostic imaging of breast: Secondary | ICD-10-CM

## 2022-09-09 ENCOUNTER — Other Ambulatory Visit: Payer: Self-pay | Admitting: Family Medicine

## 2022-09-09 DIAGNOSIS — F419 Anxiety disorder, unspecified: Secondary | ICD-10-CM

## 2022-09-11 ENCOUNTER — Telehealth: Payer: Self-pay

## 2022-09-11 NOTE — Telephone Encounter (Signed)
PA submitted and approved via covermymeds for omeprazole.

## 2022-09-16 ENCOUNTER — Ambulatory Visit
Admission: RE | Admit: 2022-09-16 | Discharge: 2022-09-16 | Disposition: A | Discharge status: Home / Self Care | Payer: 59 | Source: Ambulatory Visit | Attending: Physician Assistant | Admitting: Physician Assistant

## 2022-09-16 DIAGNOSIS — N6011 Diffuse cystic mastopathy of right breast: Secondary | ICD-10-CM | POA: Diagnosis not present

## 2022-09-16 DIAGNOSIS — R928 Other abnormal and inconclusive findings on diagnostic imaging of breast: Secondary | ICD-10-CM

## 2022-09-16 DIAGNOSIS — R92321 Mammographic fibroglandular density, right breast: Secondary | ICD-10-CM | POA: Diagnosis not present

## 2022-09-26 ENCOUNTER — Ambulatory Visit (INDEPENDENT_AMBULATORY_CARE_PROVIDER_SITE_OTHER): Payer: 59 | Admitting: Physician Assistant

## 2022-09-26 ENCOUNTER — Encounter: Payer: Self-pay | Admitting: Physician Assistant

## 2022-09-26 VITALS — BP 112/70 | HR 70 | Temp 97.1°F | Ht 65.0 in | Wt 149.2 lb

## 2022-09-26 DIAGNOSIS — G5601 Carpal tunnel syndrome, right upper limb: Secondary | ICD-10-CM | POA: Diagnosis not present

## 2022-09-26 DIAGNOSIS — Z1211 Encounter for screening for malignant neoplasm of colon: Secondary | ICD-10-CM | POA: Diagnosis not present

## 2022-09-26 DIAGNOSIS — D229 Melanocytic nevi, unspecified: Secondary | ICD-10-CM

## 2022-09-26 MED ORDER — PREDNISONE 20 MG PO TABS: ORAL_TABLET | ORAL | 0 refills | Status: AC

## 2022-09-26 NOTE — Progress Notes (Signed)
Acute Office Visit  Subjective:    Patient ID: Lori Wise, female    DOB: [birth year], 61 y.o.   MRN: [id number]  Chief Complaint  Patient presents with   Hand Pain    Right    HPI: Patient is in today for complaints of right hand pain.  Pt states that she has had trouble with carpal tunnel issues for more than 20 years.  Mostly it has been manageable but recently having pain extending up to right elbow.  Pt does not want any type of surgery done at this time.  She is using ibuprofen 88m as needed and wearing wrist splint  Pt would like referral to dermatologist -- has several moles all over body and wants full exam  Pt had scheduled for colonoscopy with Dr BMelina Copa- because he is retiring she would like to change appt to Dr MLyda Jester Past Medical History:  Diagnosis Date   Acute laryngopharyngitis 04/19/2020   Anxiety 04/19/2020   Atypical nevus 04/19/2020   Bronchitis 04/30/2020   Chronic obstructive pulmonary disease ([facility] 10/22/2020   Chronic obstructive pulmonary disease with (acute) [place] respiratory infection [place]    Encounter for immunization 04/19/2020   Essential (primary) hypertension    Gastroesophageal reflux disease 10/22/2020   Hormone replacement therapy (HRT) 10/22/2020   Migraine without aura, not intractable, without status migrainosus    Mixed hyperlipidemia    Other chest pain 03/21/2021   Other specified hypothyroidism 04/19/2020   Primary hypertension 03/21/2021   Right leg pain 07/23/2020   Sunburn of second degree     Past Surgical History:  Procedure Laterality Date   ABDOMINAL HYSTERECTOMY     CARDIAC CATHETERIZATION  2000   normal   CHOLECYSTECTOMY     NASAL HEMORRHAGE CONTROL     OTHER SURGICAL HISTORY     Ear surgeries x5    Family History  Problem Relation Age of Onset   Suicidality Mother    Lung cancer Father    Cancer Sister        Cell-1   Cancer Brother        Brain Cancer   Lung cancer Brother    Breast cancer Neg Hx      Social History   Socioeconomic History   Marital status: Legally Separated    Spouse name: Not on file   Number of children: 2   Years of education: Not on file   Highest education level: Not on file  Occupational History   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   Drug use: Never   Sexual activity: Not on file  Other Topics Concern   Not on file  Social History Narrative   Not on file   Social Determinants of Health   Financial Resource Strain: Not on file  Food Insecurity: Not on file  Transportation Needs: Not on file  Physical Activity: Not on file  Stress: Not on file  Social Connections: Not on file  Intimate Partner Violence: Not on file    Outpatient Medications Prior to Visit  Medication Sig Dispense Refill   albuterol (VENTOLIN HFA) 108 (90 Base) MCG/ACT inhaler Inhale 2 puffs into the lungs every 6 (six)  hours as needed for wheezing or shortness of breath. 8 g 2   ALPRAZolam (XANAX) 0.5 MG tablet TAKE ONE TABLET BY MOUTH 3 TIMES DAILY 90 tablet 0   Ascorbic Acid (VITAMIN C PO) Take 1 tablet by mouth daily. Unknown strength     atenolol (TENORMIN) 25 MG tablet Take 1 tablet (25 mg total) by mouth 2 (two) times daily. 60 tablet 5   b complex vitamins capsule Take 1 capsule by mouth daily.     budesonide-formoterol (SYMBICORT) 160-4.5 MCG/ACT inhaler Inhale 2 puffs into the lungs 2 (two) times daily. 10.2 g 3   cetirizine (ZYRTEC) 10 MG tablet Take 1 tablet (10 mg total) by mouth daily. 30 tablet 5   Cyanocobalamin (VITAMIN B-12 PO) Take 1 tablet by mouth daily. Unknown strenght     escitalopram (LEXAPRO) 10 MG tablet Take 1 tablet (10 mg total) by mouth daily. 30 tablet 2   estradiol (ESTRACE) 0.5 MG tablet  Take 1 tablet (0.5 mg total) by mouth daily. 90 tablet 1   ezetimibe (ZETIA) 10 MG tablet 1 po qd 30 tablet 5   ibuprofen (ADVIL) 800 MG tablet TAKE ONE TABLET BY MOUTH EVERY 8 HOURS AS NEEDED FOR PAIN (Patient taking differently: Take by mouth every 8 (eight) hours as needed for mild pain or moderate pain.) 60 tablet 2   ipratropium-albuterol (DUONEB) 0.5-2.5 (3) MG/3ML SOLN USE 1 VIAL IN NEBULIZER EVERY 4 TO 6 HOURS AS NEEDED (Patient taking differently: Inhale 3 mLs into the lungs every 4 (four) hours as needed (SOB). USE 1 VIAL IN NEBULIZER EVERY 4 TO 6 HOURS AS NEEDED) 360 mL 0   levothyroxine (SYNTHROID) 25 MCG tablet TAKE ONE TABLET BY MOUTH EVERY DAY 90 tablet 1   Omega-3 1000 MG CAPS Take 1 capsule by mouth daily.     omeprazole (PRILOSEC) 20 MG capsule 1 po qd 30 capsule 5   omeprazole (PRILOSEC) 20 MG capsule Take 1 capsule (20 mg total) by mouth 2 (two) times daily before a meal. 60 capsule 3   oxymetazoline (AFRIN) 0.05 % nasal spray Place 1 spray into both nostrils 2 (two) times daily.     rosuvastatin (CRESTOR) 5 MG tablet Take 1 tablet (5 mg total) by mouth daily. 90 tablet 0   VITAMIN E PO Take 1 tablet by mouth daily at 6 (six) AM. Unknown strength     No facility-administered medications prior to visit.    Allergies  Allergen Reactions   Codeine Hives, Itching, Other (See Comments), Rash and Nausea And Vomiting    Review of Systems CONSTITUTIONAL: Negative for chills, fatigue, fever,  E/N/T: Negative for ear pain, nasal congestion and sore throat.  CARDIOVASCULAR: Negative for chest pain, dizziness, palpitations  RESPIRATORY: Negative for recent cough and dyspnea.  MSK:see HPI INTEGUMENTARY: Negative for rash.  Skin - see HPI     Objective:  PHYSICAL EXAM:   VS: BP 112/70 (BP Location: Left Arm, Patient Position: Sitting, Cuff Size: Normal)   Pulse 70   Temp (!) 97.1 F (36.2 C) (Temporal)   Ht _0  (1.651 m)   Wt 149 lb 3.2 oz (67.7 kg)   SpO2 96%   BMI  24.83 kg/m   GEN: Well nourished, well developed, in no acute distress  Cardiac: RRR; no murmurs, rubs, or gallops,no edema -  Respiratory:  normal respiratory rate and pattern with no distress - normal breath sounds with no rales, rhonchi, wheezes or rubs MS: tender to right wrist - Phalens sign  positive Skin: warm and dry, no rash - multiple nevi noted on body - large amount under both breasts Psych: euthymic mood, appropriate affect and demeanor    Health Maintenance Due  Topic Date Due   COLONOSCOPY (Pts 45-67yr Insurance coverage will need to be confirmed)  09/07/2022    There are no preventive care reminders to display for this patient.   Lab Results  Component Value Date   TSH 2.340 04/15/2022   Lab Results  Component Value Date   WBC 11.8 (H) 04/15/2022   HGB 13.8 04/15/2022   HCT 40.8 04/15/2022   MCV 89 04/15/2022   PLT 341 04/15/2022   Lab Results  Component Value Date   NA 139 04/15/2022   K 4.3 04/15/2022   CO2 19 (L) 04/15/2022   GLUCOSE 97 04/15/2022   BUN 12 04/15/2022   CREATININE 0.77 04/15/2022   BILITOT 0.3 04/15/2022   ALKPHOS 78 04/15/2022   AST 20 04/15/2022   ALT 12 04/15/2022   PROT 6.8 04/15/2022   ALBUMIN 4.5 04/15/2022   CALCIUM 9.7 04/15/2022   EGFR 87 04/15/2022   Lab Results  Component Value Date   CHOL 151 04/15/2022   Lab Results  Component Value Date   HDL 57 04/15/2022   Lab Results  Component Value Date   LDLCALC 69 04/15/2022   Lab Results  Component Value Date   TRIG 149 04/15/2022   Lab Results  Component Value Date   CHOLHDL 2.6 04/15/2022   No results found for: "HGBA1C"     Assessment & Plan:   Problem List Items Addressed This Visit       Musculoskeletal and Integument   Atypical nevus   Relevant Orders   Ambulatory referral to Dermatology   Other Visit Diagnoses     Carpal tunnel syndrome of right wrist    -  Primary Continue current meds and wear brace Pt does not want ortho  referral Rx for prednisone   Colon cancer screening       Relevant Orders   Ambulatory referral to Gastroenterology      Meds ordered this encounter  Medications   predniSONE (DELTASONE) 20 MG tablet    Sig: Take 3 tablets (60 mg total) by mouth daily with breakfast for 3 days, THEN 2 tablets (40 mg total) daily with breakfast for 3 days, THEN 1 tablet (20 mg total) daily with breakfast for 3 days.    Dispense:  18 tablet    Refill:  0    Order Specific Question:   Supervising Provider    Answer:[name][[phone]   Orders Placed This Encounter  Procedures   Ambulatory referral to Dermatology   Ambulatory referral to Gastroenterology     Follow-up: Return if symptoms worsen or fail to improve.  An After Visit Summary was printed and given to the patient.  [facility] [phone]

## 2022-09-29 ENCOUNTER — Other Ambulatory Visit: Payer: Self-pay | Admitting: Physician Assistant

## 2022-10-07 ENCOUNTER — Other Ambulatory Visit: Payer: Self-pay | Admitting: Physician Assistant

## 2022-10-07 DIAGNOSIS — F419 Anxiety disorder, unspecified: Secondary | ICD-10-CM

## 2022-10-16 ENCOUNTER — Encounter: Payer: Self-pay | Admitting: Physician Assistant

## 2022-10-16 ENCOUNTER — Ambulatory Visit (INDEPENDENT_AMBULATORY_CARE_PROVIDER_SITE_OTHER): Payer: 59 | Admitting: Physician Assistant

## 2022-10-16 VITALS — BP 142/78 | HR 72 | Temp 97.2°F | Ht 65.0 in | Wt 150.0 lb

## 2022-10-16 DIAGNOSIS — E559 Vitamin D deficiency, unspecified: Secondary | ICD-10-CM | POA: Diagnosis not present

## 2022-10-16 DIAGNOSIS — J440 Chronic obstructive pulmonary disease with acute lower respiratory infection: Secondary | ICD-10-CM

## 2022-10-16 DIAGNOSIS — K219 Gastro-esophageal reflux disease without esophagitis: Secondary | ICD-10-CM

## 2022-10-16 DIAGNOSIS — E038 Other specified hypothyroidism: Secondary | ICD-10-CM | POA: Diagnosis not present

## 2022-10-16 DIAGNOSIS — F419 Anxiety disorder, unspecified: Secondary | ICD-10-CM

## 2022-10-16 DIAGNOSIS — E782 Mixed hyperlipidemia: Secondary | ICD-10-CM | POA: Diagnosis not present

## 2022-10-16 DIAGNOSIS — I10 Essential (primary) hypertension: Secondary | ICD-10-CM | POA: Diagnosis not present

## 2022-10-16 DIAGNOSIS — R69 Illness, unspecified: Secondary | ICD-10-CM | POA: Diagnosis not present

## 2022-10-16 NOTE — Progress Notes (Addendum)
Subjective:  Patient ID: Lori Wise, female    DOB: Nov 09, 1961  Age: 61 y.o. MRN: [id number]  Chief Complaint  Patient presents with   Hyperlipidemia    .  Hyperlipidemia    Pt presents for follow up of hypertension. The patient is tolerating the medication well without side effects. Compliance with treatment has been good; including taking medication as directed , maintains a healthy diet and regular exercise regimen , and following up as directed. She is currently on tenormin '25mg'$  1 po bid  Pt with history asthma/COPD - is having no problems with cough/congestion or wheezing Currently uses symbicort and albuterol -   Pt with history of gerd - sympotms stable on prilosec '20mg'$  qd -   Mixed hyperlipidemia  Pt presents with hyperlipidemia.  The patient is compliant with medications, maintains a low cholesterol diet , follows up as directed , and maintains an exercise regimen . The patient denies experiencing any hypercholesterolemia related symptoms. Pt currently on crestor '5mg'$  qd and zetia '10mg'$  qd - she also takes fish oil qd  Pt with history of hypothyroidism - due for labwork - taking synthroid 37mg qd - voices no concerns  Pt with history of anxiety - symptoms stable on lexapro and alprazolam that she takes as needed States she is having no problems or issues at this time  Pt still having some issues with carpal tunnel in right hand/wrist - is wearing brace and taking advil as directed - does not want referral to ortho at this time  Current Outpatient Medications on File Prior to Visit  Medication Sig Dispense Refill   albuterol (VENTOLIN HFA) 108 (90 Base) MCG/ACT inhaler Inhale 2 puffs into the lungs every 6 (six) hours as needed for wheezing or shortness of breath. 8 g 2   ALPRAZolam (XANAX) 0.5 MG tablet TAKE ONE TABLET BY MOUTH 3 TIMES DAILY 90 tablet 0   Ascorbic Acid (VITAMIN C PO) Take 1 tablet by mouth daily. Unknown strength     atenolol (TENORMIN) 25 MG tablet Take  1 tablet (25 mg total) by mouth 2 (two) times daily. 60 tablet 5   b complex vitamins capsule Take 1 capsule by mouth daily.     budesonide-formoterol (SYMBICORT) 160-4.5 MCG/ACT inhaler Inhale 2 puffs into the lungs 2 (two) times daily. 10.2 g 3   cetirizine (ZYRTEC) 10 MG tablet Take 1 tablet (10 mg total) by mouth daily. 30 tablet 5   Cyanocobalamin (VITAMIN B-12 PO) Take 1 tablet by mouth daily. Unknown strenght     escitalopram (LEXAPRO) 10 MG tablet Take 1 tablet (10 mg total) by mouth daily. 30 tablet 2   estradiol (ESTRACE) 0.5 MG tablet Take 1 tablet (0.5 mg total) by mouth daily. 90 tablet 1   ezetimibe (ZETIA) 10 MG tablet 1 po qd 30 tablet 5   ibuprofen (ADVIL) 800 MG tablet Take 1 tablet (800 mg total) by mouth every 8 (eight) hours as needed for mild pain or moderate pain. 90 tablet 1   levothyroxine (SYNTHROID) 25 MCG tablet TAKE ONE TABLET BY MOUTH EVERY DAY 90 tablet 1   Magnesium 200 MG TABS Take 1 tablet by mouth daily.     Omega-3 1000 MG CAPS Take 1 capsule by mouth daily.     omeprazole (PRILOSEC) 20 MG capsule Take 1 capsule (20 mg total) by mouth 2 (two) times daily before a meal. 60 capsule 3   rosuvastatin (CRESTOR) 5 MG tablet Take 1 tablet (5 mg total) by  mouth daily. 90 tablet 0   VITAMIN E PO Take 1 tablet by mouth daily at 6 (six) AM. Unknown strength     No current facility-administered medications on file prior to visit.   Past Medical History:  Diagnosis Date   Acute laryngopharyngitis 04/19/2020   Anxiety 04/19/2020   Atypical nevus 04/19/2020   Bronchitis 04/30/2020   Chronic obstructive pulmonary disease ([place]) 10/22/2020   Chronic obstructive pulmonary disease with (acute) lower respiratory infection ([place])    Encounter for immunization 04/19/2020   Essential (primary) hypertension    Gastroesophageal reflux disease 10/22/2020   Hormone replacement therapy (HRT) 10/22/2020   Migraine without aura, not intractable, without status migrainosus    Mixed  hyperlipidemia    Other chest pain 03/21/2021   Other specified hypothyroidism 04/19/2020   Primary hypertension 03/21/2021   Right leg pain 07/23/2020   Sunburn of second degree    Past Surgical History:  Procedure Laterality Date   ABDOMINAL HYSTERECTOMY     CARDIAC CATHETERIZATION  2000   normal   CHOLECYSTECTOMY     NASAL HEMORRHAGE CONTROL     OTHER SURGICAL HISTORY     Ear surgeries x5    Family History  Problem Relation Age of Onset   Suicidality Mother    Lung cancer Father    Cancer Sister        Cell-1   Cancer Brother        Brain Cancer   Lung cancer Brother    Breast cancer Neg Hx    Social History   Socioeconomic History   Marital status: Legally Separated    Spouse name: Not on file   Number of children: 2   Years of education: Not on file   Highest education level: Not on file  Occupational History   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   Drug use: Never   Sexual activity: Not on file  Other Topics Concern   Not on file  Social History Narrative   Not on file   Social Determinants of Health   Financial Resource Strain: Not on file  Food Insecurity: Not on file  Transportation Needs: Not on file  Physical Activity: Not on file  Stress: Not on file  Social Connections: Not on file   CONSTITUTIONAL: Negative for chills, fatigue, fever, unintentional weight gain and unintentional weight loss.  E/N/T: Negative for ear pain, nasal congestion and sore throat.  CARDIOVASCULAR: Negative for chest pain, dizziness, palpitations and pedal edema.  RESPIRATORY: Negative for recent cough and dyspnea.  GASTROINTESTINAL: Negative for abdominal pain, acid reflux symptoms, constipation, diarrhea, nausea and  vomiting.  MSK: see HPI INTEGUMENTARY: Negative for rash.  NEUROLOGICAL: Negative for dizziness and headaches.  PSYCHIATRIC: Negative for sleep disturbance and to question depression screen.  Negative for depression, negative for anhedonia.       Objective:  PHYSICAL EXAM:     10/16/2022    9:30 AM 01/08/2022    1:45 PM 02/01/2021    9:44 AM  Depression screen PHQ 2/9  Decreased Interest 0 0 0  Down, Depressed, Hopeless 0 0 0  PHQ - 2 Score 0 0 0  Altered sleeping 0  Tired, decreased energy 0    Change in appetite 0    Feeling bad or failure about yourself  0    Trouble concentrating 0    Moving slowly or fidgety/restless 0    Suicidal thoughts 0    PHQ-9 Score 0    Difficult doing work/chores Not difficult at all      VS: BP (!) 142/78 (BP Location: Left Arm, Patient Position: Sitting)   Pulse 72   Temp (!) 97.2 F (36.2 C) (Temporal)   Ht '5\' 5"'$  (1.651 m)   Wt 150 lb (68 kg)   SpO2 98%   BMI 24.96 kg/m   GEN: Well nourished, well developed, in no acute distress  Cardiac: RRR; no murmurs, rubs, or gallops,no edema -  Respiratory:  normal respiratory rate and pattern with no distress - normal breath sounds with no rales, rhonchi, wheezes or rubs  MS: no deformity or atrophy - is not wearing brace today on right wrist Skin: warm and dry, no rash  Neuro:  Alert and Oriented x 3, Strength and sensation are intact - CN II-Xii grossly intact Psych: euthymic mood, appropriate affect and demeanor   Lab Results  Component Value Date   WBC 11.8 (H) 04/15/2022   HGB 13.8 04/15/2022   HCT 40.8 04/15/2022   PLT 341 04/15/2022   GLUCOSE 97 04/15/2022   CHOL 151 04/15/2022   TRIG 149 04/15/2022   HDL 57 04/15/2022   LDLCALC 69 04/15/2022   ALT 12 04/15/2022   AST 20 04/15/2022   NA 139 04/15/2022   K 4.3 04/15/2022   CL 105 04/15/2022   CREATININE 0.77 04/15/2022   BUN 12 04/15/2022   CO2 19 (L) 04/15/2022   TSH 2.340 04/15/2022      Assessment & Plan:    Problem List Items Addressed This Visit       Cardiovascular and Mediastinum   Primary hypertension   Relevant Orders   CBC with Differential/Platelet   Comprehensive metabolic panel Continue meds Low salt diet     Respiratory   Chronic obstructive pulmonary disease with (acute) lower respiratory infection (HCC) Continue inhalers as directed     Digestive   GERD without esophagitis - Primary Continue prilosec '20mg'$  qd     Endocrine   Other specified hypothyroidism   Relevant Orders   TSH Continue synthroid 26mg qd     Other   Mixed hyperlipidemia   Relevant Orders   Lipid panel Continue fish oil, zetia and crestor Low chol diet   Anxiety Continue lexapro qd and ativan prn   Vitamin D deficiency   Relevant Orders   VITAMIN D 25 Hydroxy (Vit-D Deficiency, Fractures)  .  No orders of the defined types were placed in this encounter.   Orders Placed This Encounter  Procedures   CBC with Differential/Platelet   Comprehensive metabolic panel   TSH   Lipid panel   VITAMIN D 25 Hydroxy (Vit-D Deficiency, Fractures)     Follow-up: Return in about 6 months (around 04/16/2023) for chronic fasting follow up.  An After Visit Summary was printed and given to the patient.  [facility] [phone]

## 2022-10-17 LAB — CBC WITH DIFFERENTIAL/PLATELET
Basophils Absolute: 0.1 10*3/uL (ref 0.0–0.2)
Basos: 1 %
EOS (ABSOLUTE): 0.2 10*3/uL (ref 0.0–0.4)
Eos: 1 %
Hematocrit: 40.4 % (ref 34.0–46.6)
Hemoglobin: 13.3 g/dL (ref 11.1–15.9)
Immature Grans (Abs): 0.1 10*3/uL (ref 0.0–0.1)
Immature Granulocytes: 1 %
Lymphocytes Absolute: 4.7 10*3/uL — ABNORMAL HIGH (ref 0.7–3.1)
Lymphs: 39 %
MCH: 30.9 pg (ref 26.6–33.0)
MCHC: 32.9 g/dL (ref 31.5–35.7)
MCV: 94 fL (ref 79–97)
Monocytes Absolute: 0.8 10*3/uL (ref 0.1–0.9)
Monocytes: 7 %
Neutrophils Absolute: 6.4 10*3/uL (ref 1.4–7.0)
Neutrophils: 51 %
Platelets: 315 10*3/uL (ref 150–450)
RBC: 4.31 x10E6/uL (ref 3.77–5.28)
RDW: 12.9 % (ref 11.7–15.4)
WBC: 12.2 10*3/uL — ABNORMAL HIGH (ref 3.4–10.8)

## 2022-10-17 LAB — COMPREHENSIVE METABOLIC PANEL
ALT: 19 IU/L (ref 0–32)
AST: 21 IU/L (ref 0–40)
Albumin/Globulin Ratio: 2 (ref 1.2–2.2)
Albumin: 4.3 g/dL (ref 3.9–4.9)
Alkaline Phosphatase: 76 IU/L (ref 44–121)
BUN/Creatinine Ratio: 16 (ref 12–28)
BUN: 13 mg/dL (ref 8–27)
Bilirubin Total: 0.2 mg/dL (ref 0.0–1.2)
CO2: 21 mmol/L (ref 20–29)
Calcium: 9.7 mg/dL (ref 8.7–10.3)
Chloride: 107 mmol/L — ABNORMAL HIGH (ref 96–106)
Creatinine, Ser: 0.8 mg/dL (ref 0.57–1.00)
Globulin, Total: 2.1 g/dL (ref 1.5–4.5)
Glucose: 89 mg/dL (ref 70–99)
Potassium: 4.4 mmol/L (ref 3.5–5.2)
Sodium: 143 mmol/L (ref 134–144)
Total Protein: 6.4 g/dL (ref 6.0–8.5)
eGFR: 83 mL/min/{1.73_m2} (ref >59)

## 2022-10-17 LAB — LIPID PANEL
Chol/HDL Ratio: 2.3 ratio (ref 0.0–4.4)
Cholesterol, Total: 166 mg/dL (ref 100–199)
HDL: 72 mg/dL (ref >39)
LDL Chol Calc (NIH): 78 mg/dL (ref 0–99)
Triglycerides: 88 mg/dL (ref 0–149)
VLDL Cholesterol Cal: 16 mg/dL (ref 5–40)

## 2022-10-17 LAB — VITAMIN D 25 HYDROXY (VIT D DEFICIENCY, FRACTURES): Vit D, 25-Hydroxy: 36.8 ng/mL (ref 30.0–100.0)

## 2022-10-17 LAB — CARDIOVASCULAR RISK ASSESSMENT

## 2022-10-17 LAB — TSH: TSH: 1.98 u[IU]/mL (ref 0.450–4.500)

## 2022-10-23 ENCOUNTER — Other Ambulatory Visit: Payer: Self-pay | Admitting: Physician Assistant

## 2022-10-23 DIAGNOSIS — I10 Essential (primary) hypertension: Secondary | ICD-10-CM

## 2022-10-23 DIAGNOSIS — J3089 Other allergic rhinitis: Secondary | ICD-10-CM

## 2022-10-23 DIAGNOSIS — E782 Mixed hyperlipidemia: Secondary | ICD-10-CM

## 2022-11-07 ENCOUNTER — Other Ambulatory Visit: Payer: Self-pay | Admitting: Physician Assistant

## 2022-11-07 DIAGNOSIS — F419 Anxiety disorder, unspecified: Secondary | ICD-10-CM

## 2022-11-23 ENCOUNTER — Other Ambulatory Visit: Payer: Self-pay | Admitting: Nurse Practitioner

## 2022-11-23 DIAGNOSIS — K219 Gastro-esophageal reflux disease without esophagitis: Secondary | ICD-10-CM

## 2022-11-29 ENCOUNTER — Other Ambulatory Visit: Payer: Self-pay | Admitting: Physician Assistant

## 2022-11-29 DIAGNOSIS — E782 Mixed hyperlipidemia: Secondary | ICD-10-CM

## 2022-12-09 ENCOUNTER — Other Ambulatory Visit: Payer: Self-pay | Admitting: Physician Assistant

## 2022-12-09 DIAGNOSIS — F419 Anxiety disorder, unspecified: Secondary | ICD-10-CM

## 2022-12-18 ENCOUNTER — Ambulatory Visit: Payer: 59 | Admitting: Physician Assistant

## 2022-12-26 LAB — HM COLONOSCOPY

## 2023-01-02 ENCOUNTER — Encounter: Payer: Self-pay | Admitting: Physician Assistant

## 2023-01-05 ENCOUNTER — Other Ambulatory Visit: Payer: Self-pay | Admitting: Physician Assistant

## 2023-01-05 DIAGNOSIS — F419 Anxiety disorder, unspecified: Secondary | ICD-10-CM

## 2023-01-21 ENCOUNTER — Encounter: Payer: Self-pay | Admitting: Physician Assistant

## 2023-01-21 ENCOUNTER — Ambulatory Visit (INDEPENDENT_AMBULATORY_CARE_PROVIDER_SITE_OTHER): Payer: Commercial Managed Care - HMO | Admitting: Physician Assistant

## 2023-01-21 ENCOUNTER — Other Ambulatory Visit: Payer: Self-pay | Admitting: Family Medicine

## 2023-01-21 VITALS — BP 114/68 | HR 81 | Temp 97.1°F | Ht 65.0 in | Wt 148.8 lb

## 2023-01-21 DIAGNOSIS — J069 Acute upper respiratory infection, unspecified: Secondary | ICD-10-CM

## 2023-01-21 DIAGNOSIS — Z79890 Hormone replacement therapy (postmenopausal): Secondary | ICD-10-CM

## 2023-01-21 DIAGNOSIS — J441 Chronic obstructive pulmonary disease with (acute) exacerbation: Secondary | ICD-10-CM | POA: Diagnosis not present

## 2023-01-21 DIAGNOSIS — J440 Chronic obstructive pulmonary disease with acute lower respiratory infection: Secondary | ICD-10-CM | POA: Diagnosis not present

## 2023-01-21 DIAGNOSIS — R0602 Shortness of breath: Secondary | ICD-10-CM

## 2023-01-21 LAB — POC COVID19 BINAXNOW: SARS Coronavirus 2 Ag: NEGATIVE

## 2023-01-21 MED ORDER — PROMETHAZINE-DM 6.25-15 MG/5ML PO SYRP: 5.0000 mL | ORAL_SOLUTION | Freq: Four times a day (QID) | ORAL | 0 refills | Status: DC | PRN

## 2023-01-21 MED ORDER — AMOXICILLIN-POT CLAVULANATE 875-125 MG PO TABS: 1.0000 | ORAL_TABLET | Freq: Two times a day (BID) | ORAL | 0 refills | Status: DC

## 2023-01-21 MED ORDER — IPRATROPIUM-ALBUTEROL 0.5-2.5 (3) MG/3ML IN SOLN: 3.0000 mL | Freq: Once | RESPIRATORY_TRACT | Status: DC

## 2023-01-21 MED ORDER — IPRATROPIUM-ALBUTEROL 0.5-2.5 (3) MG/3ML IN SOLN: 3.0000 mL | RESPIRATORY_TRACT | 3 refills | Status: AC | PRN

## 2023-01-21 MED ORDER — PREDNISONE 20 MG PO TABS: ORAL_TABLET | ORAL | 0 refills | Status: AC

## 2023-01-21 MED ORDER — TRIAMCINOLONE ACETONIDE 40 MG/ML IJ SUSP
60.0000 mg | Freq: Once | INTRAMUSCULAR | Status: AC
  Administered 2023-01-21: 60 mg via INTRAMUSCULAR

## 2023-01-21 MED ORDER — ALBUTEROL SULFATE HFA 108 (90 BASE) MCG/ACT IN AERS: 2.0000 | INHALATION_SPRAY | Freq: Four times a day (QID) | RESPIRATORY_TRACT | 2 refills | Status: DC | PRN

## 2023-01-21 NOTE — Addendum Note (Signed)
Addended byMarge Duncans on: 01/21/2023 04:18 PM   Modules accepted: Orders

## 2023-01-21 NOTE — Progress Notes (Signed)
Acute Office Visit  Subjective:    Patient ID: Lori Wise, female    DOB: 1961-01-22, 62 y.o.   MRN: [id number]  Chief Complaint  Patient presents with   URI    HPI Patient is in today for complaints of cough, congestion, sinus pressure and pnd - she has had a cough with wheezing for over a week and symptoms are worsening She is not taking her symbicort as directed and is out of albuterol She is taking zyrtec qd Daughter with same symptoms She denies malaise or fever at this time  Past Medical History:  Diagnosis Date   Acute laryngopharyngitis 04/19/2020   Anxiety 04/19/2020   Atypical nevus 04/19/2020   Bronchitis 04/30/2020   Chronic obstructive pulmonary disease ([place]) 10/22/2020   Chronic obstructive pulmonary disease with (acute) lower respiratory infection ([place])    Encounter for immunization 04/19/2020   Essential (primary) hypertension    Gastroesophageal reflux disease 10/22/2020   Hormone replacement therapy (HRT) 10/22/2020   Migraine without aura, not intractable, without status migrainosus    Mixed hyperlipidemia    Other chest pain 03/21/2021   Other specified hypothyroidism 04/19/2020   Primary hypertension 03/21/2021   Right leg pain 07/23/2020   Sunburn of second degree     Past Surgical History:  Procedure Laterality Date   ABDOMINAL HYSTERECTOMY     CARDIAC CATHETERIZATION  2000   normal   CHOLECYSTECTOMY     NASAL HEMORRHAGE CONTROL     OTHER SURGICAL HISTORY     Ear surgeries x5    Family History  Problem Relation Age of Onset   Suicidality Mother    Lung cancer Father    Cancer Sister        Cell-1   Cancer Brother        Brain Cancer   Lung cancer Brother    Breast cancer Neg Hx     Social History   Socioeconomic History   Marital status: Legally Separated    Spouse name: Not on file   Number of children: 2   Years of education: Not on file   Highest education level: Not on file  Occupational History   Occupation: [employer]  service  Tobacco Use   Smoking status: Every Day    Packs/day: 1.00    Types: Cigarettes   Smokeless tobacco: Never  Vaping Use   Vaping Use: Never used  Substance and Sexual Activity   Alcohol use: Not Currently    Comment: Drinks approximately two times per week.   Drug use: Never   Sexual activity: Not on file  Other Topics Concern   Not on file  Social History Narrative   Not on file   Social Determinants of Health   Financial Resource Strain: Not on file  Food Insecurity: Not on file  Transportation Needs: Not on file  Physical Activity: Not on file  Stress: Not on file  Social Connections: Not on file  Intimate Partner Violence: Not on file     Current Outpatient Medications:    ALPRAZolam (XANAX) 0.5 MG tablet, TAKE ONE TABLET BY MOUTH 3 TIMES DAILY, Disp: 90 tablet, Rfl: 0   amoxicillin-clavulanate (AUGMENTIN) 875-125 MG tablet, Take 1 tablet by mouth 2 (two) times daily., Disp: 20 tablet, Rfl: 0   Ascorbic Acid (VITAMIN C PO), Take 1 tablet by mouth daily. Unknown strength, Disp: , Rfl:    atenolol (TENORMIN) 25 MG tablet, Take 1 tablet (25 mg total) by mouth 2 (two) times  daily., Disp: 60 tablet, Rfl: 5   b complex vitamins capsule, Take 1 capsule by mouth daily., Disp: , Rfl:    budesonide-formoterol (SYMBICORT) 160-4.5 MCG/ACT inhaler, Inhale 2 puffs into the lungs 2 (two) times daily., Disp: 10.2 g, Rfl: 3   cetirizine (ZYRTEC) 10 MG tablet, Take 1 tablet (10 mg total) by mouth daily., Disp: 30 tablet, Rfl: 5   Cyanocobalamin (VITAMIN B-12 PO), Take 1 tablet by mouth daily. Unknown strenght, Disp: , Rfl:    escitalopram (LEXAPRO) 10 MG tablet, Take 1 tablet (10 mg total) by mouth daily., Disp: 30 tablet, Rfl: 2   estradiol (ESTRACE) 0.5 MG tablet, Take 1 tablet (0.5 mg total) by mouth daily., Disp: 90 tablet, Rfl: 1   ezetimibe (ZETIA) 10 MG tablet, TAKE ONE TABLET BY MOUTH EVERY DAY, Disp: 30 tablet, Rfl: 5   ibuprofen (ADVIL) 800 MG tablet, Take 1 tablet (800  mg total) by mouth every 8 (eight) hours as needed for mild pain or moderate pain., Disp: 90 tablet, Rfl: 1   levothyroxine (SYNTHROID) 25 MCG tablet, TAKE ONE TABLET BY MOUTH EVERY DAY, Disp: 90 tablet, Rfl: 1   Magnesium 200 MG TABS, Take 1 tablet by mouth daily., Disp: , Rfl:    Omega-3 1000 MG CAPS, Take 1 capsule by mouth daily., Disp: , Rfl:    omeprazole (PRILOSEC) 20 MG capsule, Take 1 capsule (20 mg total) by mouth 2 (two) times daily before a meal., Disp: 60 capsule, Rfl: 3   predniSONE (DELTASONE) 20 MG tablet, Take 3 tablets (60 mg total) by mouth daily with breakfast for 3 days, THEN 2 tablets (40 mg total) daily with breakfast for 3 days, THEN 1 tablet (20 mg total) daily with breakfast for 3 days., Disp: 18 tablet, Rfl: 0   promethazine-dextromethorphan (PROMETHAZINE-DM) 6.25-15 MG/5ML syrup, Take 5 mLs by mouth 4 (four) times daily as needed., Disp: 118 mL, Rfl: 0   rosuvastatin (CRESTOR) 5 MG tablet, Take 1 tablet (5 mg total) by mouth daily., Disp: 90 tablet, Rfl: 0   VITAMIN E PO, Take 1 tablet by mouth daily at 6 (six) AM. Unknown strength, Disp: , Rfl:    albuterol (VENTOLIN HFA) 108 (90 Base) MCG/ACT inhaler, Inhale 2 puffs into the lungs every 6 (six) hours as needed for wheezing or shortness of breath., Disp: 8 g, Rfl: 2  Current Facility-Administered Medications:    ipratropium-albuterol (DUONEB) 0.5-2.5 (3) MG/3ML nebulizer solution 3 mL, 3 mL, Nebulization, Once, Marge Duncans, [name]   Allergies  Allergen Reactions   Codeine Hives, Itching, Other (See Comments), Rash and Nausea And Vomiting    CONSTITUTIONAL:see HPI E/N/T:see HPI CARDIOVASCULAR: Negative for chest pain, dizziness, RESPIRATORY: see HPI GASTROINTESTINAL: Negative for abdominal pain, acid reflux symptoms, constipation, diarrhea, nausea and vomiting.  INTEGUMENTARY: Negative for rash.       Objective:    PHYSICAL EXAM:   VS: BP 114/68 (BP Location: Left Arm, Patient Position: Sitting, Cuff Size:  Large)   Pulse 81   Temp (!) 97.1 F (36.2 C) (Temporal)   Ht 5' 5"$  (1.651 m)   Wt 148 lb 12.8 oz (67.5 kg)   SpO2 94%   BMI 24.76 kg/m   GEN: Well nourished, well developed, in no acute distress  HEENT: normal external ears and nose - normal external auditory canals and TMS -  - Lips, Teeth and Gums - normal  Oropharynx - erythema/pnd Cardiac: RRR; no murmurs,  Respiratory:  scattered rhonchi and wheezes throughout Skin: warm and dry, no  rash     Wt Readings from Last 3 Encounters:  01/21/23 148 lb 12.8 oz (67.5 kg)  10/16/22 150 lb (68 kg)  09/26/22 149 lb 3.2 oz (67.7 kg)    There are no preventive care reminders to display for this patient.  There are no preventive care reminders to display for this patient.        Assessment & Plan:   Problem List Items Addressed This Visit       Respiratory   Chronic obstructive pulmonary disease with (acute) lower respiratory infection ([place])   Relevant Medications   ipratropium-albuterol (DUONEB) 0.5-2.5 (3) MG/3ML nebulizer solution 3 mL (Start on 01/21/2023  4:15 PM)   promethazine-dextromethorphan (PROMETHAZINE-DM) 6.25-15 MG/5ML syrup   predniSONE (DELTASONE) 20 MG tablet   albuterol (VENTOLIN HFA) 108 (90 Base) MCG/ACT inhaler   Other Visit Diagnoses     Acute upper respiratory infection    -  Primary   Relevant Orders   POC COVID-19 [employer]Now (Completed)   Shortness of breath       Relevant Medications   albuterol (VENTOLIN HFA) 108 (90 Base) MCG/ACT inhaler   COPD with acute exacerbation ([facility])       Relevant Medications   triamcinolone acetonide (KENALOG-40) injection 60 mg (Completed) (Start on 01/21/2023  4:15 PM)   ipratropium-albuterol (DUONEB) 0.5-2.5 (3) MG/3ML nebulizer solution 3 mL (Start on 01/21/2023  4:15 PM)   promethazine-dextromethorphan (PROMETHAZINE-DM) 6.25-15 MG/5ML syrup   predniSONE (DELTASONE) 20 MG tablet   amoxicillin-clavulanate (AUGMENTIN) 875-125 MG tablet   albuterol (VENTOLIN HFA) 108  (90 Base) MCG/ACT inhaler        Meds ordered this encounter  Medications   triamcinolone acetonide (KENALOG-40) injection 60 mg   ipratropium-albuterol (DUONEB) 0.5-2.5 (3) MG/3ML nebulizer solution 3 mL   promethazine-dextromethorphan (PROMETHAZINE-DM) 6.25-15 MG/5ML syrup    Sig: Take 5 mLs by mouth 4 (four) times daily as needed.    Dispense:  118 mL    Refill:  0    Order Specific Question:   Supervising Provider    Answer:   [name]   predniSONE (DELTASONE) 20 MG tablet    Sig: Take 3 tablets (60 mg total) by mouth daily with breakfast for 3 days, THEN 2 tablets (40 mg total) daily with breakfast for 3 days, THEN 1 tablet (20 mg total) daily with breakfast for 3 days.    Dispense:  18 tablet    Refill:  0    Order Specific Question:   Supervising Provider    Answer:   [name]   amoxicillin-clavulanate (AUGMENTIN) 875-125 MG tablet    Sig: Take 1 tablet by mouth 2 (two) times daily.    Dispense:  20 tablet    Refill:  0    Order Specific Question:   Supervising Provider    Answer:   [name]   albuterol (VENTOLIN HFA) 108 (90 Base) MCG/ACT inhaler    Sig: Inhale 2 puffs into the lungs every 6 (six) hours as needed for wheezing or shortness of breath.    Dispense:  8 g    Refill:  2    Order Specific Question:   Supervising Provider    Answer:   COX, KIRSTEN [id number]     [place], [name]

## 2023-01-28 ENCOUNTER — Encounter: Payer: Self-pay | Admitting: Physician Assistant

## 2023-01-28 ENCOUNTER — Ambulatory Visit (INDEPENDENT_AMBULATORY_CARE_PROVIDER_SITE_OTHER): Payer: Commercial Managed Care - HMO | Admitting: Physician Assistant

## 2023-01-28 VITALS — BP 120/70 | HR 76 | Temp 97.3°F | Ht 65.0 in | Wt 149.8 lb

## 2023-01-28 DIAGNOSIS — Z720 Tobacco use: Secondary | ICD-10-CM

## 2023-01-28 DIAGNOSIS — J440 Chronic obstructive pulmonary disease with acute lower respiratory infection: Secondary | ICD-10-CM

## 2023-01-28 DIAGNOSIS — J438 Other emphysema: Secondary | ICD-10-CM | POA: Diagnosis not present

## 2023-01-28 MED ORDER — TRELEGY ELLIPTA 200-62.5-25 MCG/ACT IN AEPB: 1.0000 | INHALATION_SPRAY | Freq: Every day | RESPIRATORY_TRACT | 2 refills | Status: DC

## 2023-01-28 NOTE — Progress Notes (Signed)
Acute Office Visit  Subjective:    Patient ID: Lori Wise, female    DOB: Nov 04, 1961, 62 y.o.   MRN: [id number]  Chief Complaint  Patient presents with   Follow-up    1 week    HPI Patient is in today for follow up of COPD exacerbation.  She states overall she is feeling better but still hoarse.  She has some augmentin and prednisone left over.  Is using albuterol, symbicort and duoneb as needed.  She states she does not use duoneb when not feeling sick.   I did review chest xray that she had with last provider which was in January and it is noted that she does have emphysema.  I discussed switching from symbicort to trelegy which she is agreeable.  Also pt had a CTA of chest in 1/22 but has not had low dose lung CT since that time.  Pt is a smoker of about 10 cigarettes per day  Past Medical History:  Diagnosis Date   Acute laryngopharyngitis 04/19/2020   Anxiety 04/19/2020   Atypical nevus 04/19/2020   Bronchitis 04/30/2020   Chronic obstructive pulmonary disease ([place]) 10/22/2020   Chronic obstructive pulmonary disease with (acute) lower respiratory infection ([place])    Encounter for immunization 04/19/2020   Essential (primary) hypertension    Gastroesophageal reflux disease 10/22/2020   Hormone replacement therapy (HRT) 10/22/2020   Migraine without aura, not intractable, without status migrainosus    Mixed hyperlipidemia    Other chest pain 03/21/2021   Other specified hypothyroidism 04/19/2020   Primary hypertension 03/21/2021   Right leg pain 07/23/2020   Sunburn of second degree     Past Surgical History:  Procedure Laterality Date   ABDOMINAL HYSTERECTOMY     CARDIAC CATHETERIZATION  2000   normal   CHOLECYSTECTOMY     NASAL HEMORRHAGE CONTROL     OTHER SURGICAL HISTORY     Ear surgeries x5    Family History  Problem Relation Age of Onset   Suicidality Mother    Lung cancer Father    Cancer Sister        Cell-1   Cancer Brother        Brain Cancer   Lung  cancer Brother    Breast cancer Neg Hx     Social History   Socioeconomic History   Marital status: Legally Separated    Spouse name: Not on file   Number of children: 2   Years of education: Not on file   Highest education level: Not on file  Occupational History   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   Drug use: Never   Sexual activity: Not on file  Other Topics Concern   Not on file  Social History Narrative   Not on file   Social Determinants of Health   Financial Resource Strain: Not on file  Food Insecurity: Not on file  Transportation Needs: Not on file  Physical Activity: Not on file  Stress: Not on file  Social Connections: Not on file  Intimate Partner Violence: Not on file     Current Outpatient Medications:    albuterol (VENTOLIN HFA) 108 (90 Base) MCG/ACT inhaler, Inhale  2 puffs into the lungs every 6 (six) hours as needed for wheezing or shortness of breath., Disp: 8 g, Rfl: 2   ALPRAZolam (XANAX) 0.5 MG tablet, TAKE ONE TABLET BY MOUTH 3 TIMES DAILY, Disp: 90 tablet, Rfl: 0   amoxicillin-clavulanate (AUGMENTIN) 875-125 MG tablet, Take 1 tablet by mouth 2 (two) times daily., Disp: 20 tablet, Rfl: 0   Ascorbic Acid (VITAMIN C PO), Take 1 tablet by mouth daily. Unknown strength, Disp: , Rfl:    atenolol (TENORMIN) 25 MG tablet, Take 1 tablet (25 mg total) by mouth 2 (two) times daily., Disp: 60 tablet, Rfl: 5   b complex vitamins capsule, Take 1 capsule by mouth daily., Disp: , Rfl:    cetirizine (ZYRTEC) 10 MG tablet, Take 1 tablet (10 mg total) by mouth daily., Disp: 30 tablet, Rfl: 5   Cyanocobalamin (VITAMIN B-12 PO), Take 1 tablet by mouth daily. Unknown strenght, Disp: , Rfl:    escitalopram  (LEXAPRO) 10 MG tablet, Take 1 tablet (10 mg total) by mouth daily., Disp: 30 tablet, Rfl: 2   estradiol (ESTRACE) 0.5 MG tablet, Take 1 tablet (0.5 mg total) by mouth daily., Disp: 90 tablet, Rfl: 1   ezetimibe (ZETIA) 10 MG tablet, TAKE ONE TABLET BY MOUTH EVERY DAY, Disp: 30 tablet, Rfl: 5   Fluticasone-Umeclidin-Vilant (TRELEGY ELLIPTA) 200-62.5-25 MCG/ACT AEPB, Inhale 1 Inhalation into the lungs daily., Disp: 1 each, Rfl: 2   ibuprofen (ADVIL) 800 MG tablet, Take 1 tablet (800 mg total) by mouth every 8 (eight) hours as needed for mild pain or moderate pain., Disp: 90 tablet, Rfl: 1   ipratropium-albuterol (DUONEB) 0.5-2.5 (3) MG/3ML SOLN, Take 3 mLs by nebulization every 4 (four) hours as needed., Disp: 360 mL, Rfl: 3   levothyroxine (SYNTHROID) 25 MCG tablet, TAKE ONE TABLET BY MOUTH EVERY DAY, Disp: 90 tablet, Rfl: 1   Magnesium 200 MG TABS, Take 1 tablet by mouth daily., Disp: , Rfl:    Omega-3 1000 MG CAPS, Take 1 capsule by mouth daily., Disp: , Rfl:    omeprazole (PRILOSEC) 20 MG capsule, Take 1 capsule (20 mg total) by mouth 2 (two) times daily before a meal., Disp: 60 capsule, Rfl: 3   predniSONE (DELTASONE) 20 MG tablet, Take 3 tablets (60 mg total) by mouth daily with breakfast for 3 days, THEN 2 tablets (40 mg total) daily with breakfast for 3 days, THEN 1 tablet (20 mg total) daily with breakfast for 3 days., Disp: 18 tablet, Rfl: 0   promethazine-dextromethorphan (PROMETHAZINE-DM) 6.25-15 MG/5ML syrup, Take 5 mLs by mouth 4 (four) times daily as needed., Disp: 118 mL, Rfl: 0   rosuvastatin (CRESTOR) 5 MG tablet, Take 1 tablet (5 mg total) by mouth daily., Disp: 90 tablet, Rfl: 0   VITAMIN E PO, Take 1 tablet by mouth daily at 6 (six) AM. Unknown strength, Disp: , Rfl:   Current Facility-Administered Medications:    ipratropium-albuterol (DUONEB) 0.5-2.5 (3) MG/3ML nebulizer solution 3 mL, 3 mL, Nebulization, Once, Marge Duncans, [name]   Allergies  Allergen Reactions   Codeine  Hives, Itching, Other (See Comments), Rash and Nausea And Vomiting    CONSTITUTIONAL: Negative for chills, fatigue, fever,  E/N/T: Negative for ear pain, nasal congestion and sore throat.  CARDIOVASCULAR: Negative for chest pain, dizziness,  RESPIRATORY: see HPI GASTROINTESTINAL: Negative for abdominal pain, acid reflux symptoms, constipation, diarrhea, nausea and vomiting.         Objective:    PHYSICAL EXAM:   VS: BP 120/70 (BP Location:  Left Arm, Patient Position: Sitting, Cuff Size: Normal)   Pulse 76   Temp (!) 97.3 F (36.3 C) (Temporal)   Ht 5' 5"$  (1.651 m)   Wt 149 lb 12.8 oz (67.9 kg)   SpO2 98%   BMI 24.93 kg/m   GEN: Well nourished, well developed, in no acute distress  HEENT: normal external ears and nose - normal external auditory canals and TMS -- Lips, Teeth and Gums - normal  Oropharynx - normal mucosa, palate, and posterior pharynx Cardiac: RRR; no murmurs, rubs, or gallops,no edema - no significant varicosities Respiratory:  normal respiratory rate - rare scattered wheeze noted - much improvement since last visit    Wt Readings from Last 3 Encounters:  01/28/23 149 lb 12.8 oz (67.9 kg)  01/21/23 148 lb 12.8 oz (67.5 kg)  10/16/22 150 lb (68 kg)    There are no preventive care reminders to display for this patient.  There are no preventive care reminders to display for this patient.        Assessment & Plan:   Problem List Items Addressed This Visit       Respiratory   Chronic obstructive pulmonary disease ([place])   Relevant Medications   Fluticasone-Umeclidin-Vilant (TRELEGY ELLIPTA) 200-62.5-25 MCG/ACT AEPB Continue albuterol but stop symbicort   Chronic obstructive pulmonary disease with (acute) lower respiratory infection ([place]) - Primary   Relevant Medications   Fluticasone-Umeclidin-Vilant (TRELEGY ELLIPTA) 200-62.5-25 MCG/ACT AEPB Complete augmentin and prednisone     Other   Tobacco abuse   Relevant Orders   CT CHEST LUNG CA  SCREEN LOW DOSE W/O CM Efforts at smoking cessation     Meds ordered this encounter  Medications   Fluticasone-Umeclidin-Vilant (TRELEGY ELLIPTA) 200-62.5-25 MCG/ACT AEPB    Sig: Inhale 1 Inhalation into the lungs daily.    Dispense:  1 each    Refill:  2    Order Specific Question:   Supervising Provider    Answer:   [name]     [place], [name]

## 2023-02-10 ENCOUNTER — Other Ambulatory Visit: Payer: Self-pay

## 2023-02-10 DIAGNOSIS — J438 Other emphysema: Secondary | ICD-10-CM

## 2023-02-10 MED ORDER — TRELEGY ELLIPTA 200-62.5-25 MCG/ACT IN AEPB: 1.0000 | INHALATION_SPRAY | Freq: Every day | RESPIRATORY_TRACT | 2 refills | Status: DC

## 2023-03-04 ENCOUNTER — Other Ambulatory Visit: Payer: Self-pay | Admitting: Physician Assistant

## 2023-03-04 DIAGNOSIS — E782 Mixed hyperlipidemia: Secondary | ICD-10-CM

## 2023-03-05 ENCOUNTER — Other Ambulatory Visit: Payer: Self-pay

## 2023-03-05 ENCOUNTER — Other Ambulatory Visit: Payer: Self-pay | Admitting: Physician Assistant

## 2023-03-05 DIAGNOSIS — Z720 Tobacco use: Secondary | ICD-10-CM

## 2023-03-05 DIAGNOSIS — F419 Anxiety disorder, unspecified: Secondary | ICD-10-CM

## 2023-03-24 ENCOUNTER — Other Ambulatory Visit: Payer: Self-pay

## 2023-03-24 DIAGNOSIS — K219 Gastro-esophageal reflux disease without esophagitis: Secondary | ICD-10-CM

## 2023-03-24 MED ORDER — OMEPRAZOLE 20 MG PO CPDR: 20.0000 mg | DELAYED_RELEASE_CAPSULE | Freq: Two times a day (BID) | ORAL | 3 refills | Status: DC

## 2023-04-06 ENCOUNTER — Other Ambulatory Visit: Payer: Self-pay | Admitting: Physician Assistant

## 2023-04-06 DIAGNOSIS — F419 Anxiety disorder, unspecified: Secondary | ICD-10-CM

## 2023-04-15 ENCOUNTER — Ambulatory Visit (INDEPENDENT_AMBULATORY_CARE_PROVIDER_SITE_OTHER): Payer: Commercial Managed Care - HMO | Admitting: Physician Assistant

## 2023-04-15 VITALS — BP 112/70 | HR 81 | Temp 97.1°F | Ht 65.0 in | Wt 150.0 lb

## 2023-04-15 DIAGNOSIS — J440 Chronic obstructive pulmonary disease with acute lower respiratory infection: Secondary | ICD-10-CM | POA: Diagnosis not present

## 2023-04-15 DIAGNOSIS — E038 Other specified hypothyroidism: Secondary | ICD-10-CM

## 2023-04-15 DIAGNOSIS — K219 Gastro-esophageal reflux disease without esophagitis: Secondary | ICD-10-CM | POA: Diagnosis not present

## 2023-04-15 DIAGNOSIS — E782 Mixed hyperlipidemia: Secondary | ICD-10-CM

## 2023-04-15 DIAGNOSIS — M542 Cervicalgia: Secondary | ICD-10-CM

## 2023-04-15 DIAGNOSIS — I10 Essential (primary) hypertension: Secondary | ICD-10-CM

## 2023-04-15 DIAGNOSIS — E559 Vitamin D deficiency, unspecified: Secondary | ICD-10-CM

## 2023-04-15 DIAGNOSIS — Z720 Tobacco use: Secondary | ICD-10-CM

## 2023-04-15 DIAGNOSIS — N3001 Acute cystitis with hematuria: Secondary | ICD-10-CM | POA: Diagnosis not present

## 2023-04-15 DIAGNOSIS — F419 Anxiety disorder, unspecified: Secondary | ICD-10-CM

## 2023-04-15 DIAGNOSIS — J438 Other emphysema: Secondary | ICD-10-CM

## 2023-04-15 LAB — POCT URINALYSIS DIP (CLINITEK)
Glucose, UA: NEGATIVE mg/dL
Ketones, POC UA: NEGATIVE mg/dL
Nitrite, UA: NEGATIVE
Spec Grav, UA: >=1.03 — AB (ref 1.010–1.025)
Urobilinogen, UA: 0.2 E.U./dL
pH, UA: 6 (ref 5.0–8.0)

## 2023-04-15 MED ORDER — CYCLOBENZAPRINE HCL 5 MG PO TABS
5.0000 mg | ORAL_TABLET | Freq: Every day | ORAL | 1 refills | Status: DC
Start: 2023-04-15 — End: 2023-08-04

## 2023-04-15 MED ORDER — OMEPRAZOLE 20 MG PO CPDR
20.0000 mg | DELAYED_RELEASE_CAPSULE | Freq: Every day | ORAL | 3 refills | Status: DC
Start: 2023-04-15 — End: 2023-07-21

## 2023-04-15 MED ORDER — NITROFURANTOIN MONOHYD MACRO 100 MG PO CAPS
100.0000 mg | ORAL_CAPSULE | Freq: Two times a day (BID) | ORAL | 0 refills | Status: DC
Start: 2023-04-15 — End: 2023-05-06

## 2023-04-15 MED ORDER — TRELEGY ELLIPTA 200-62.5-25 MCG/ACT IN AEPB: 1.0000 | INHALATION_SPRAY | Freq: Every day | RESPIRATORY_TRACT | 5 refills | Status: DC

## 2023-04-16 ENCOUNTER — Encounter: Payer: Self-pay | Admitting: Physician Assistant

## 2023-04-16 DIAGNOSIS — N3001 Acute cystitis with hematuria: Secondary | ICD-10-CM | POA: Insufficient documentation

## 2023-04-16 DIAGNOSIS — M542 Cervicalgia: Secondary | ICD-10-CM | POA: Insufficient documentation

## 2023-04-16 LAB — CBC WITH DIFFERENTIAL/PLATELET
Basophils Absolute: 0.1 10*3/uL (ref 0.0–0.2)
Basos: 1 %
EOS (ABSOLUTE): 0.2 10*3/uL (ref 0.0–0.4)
Eos: 2 %
Hematocrit: 41 % (ref 34.0–46.6)
Hemoglobin: 13.6 g/dL (ref 11.1–15.9)
Immature Grans (Abs): 0.1 10*3/uL (ref 0.0–0.1)
Immature Granulocytes: 1 %
Lymphocytes Absolute: 3.8 10*3/uL — ABNORMAL HIGH (ref 0.7–3.1)
Lymphs: 34 %
MCH: 30.3 pg (ref 26.6–33.0)
MCHC: 33.2 g/dL (ref 31.5–35.7)
MCV: 91 fL (ref 79–97)
Monocytes Absolute: 0.8 10*3/uL (ref 0.1–0.9)
Monocytes: 7 %
Neutrophils Absolute: 6.1 10*3/uL (ref 1.4–7.0)
Neutrophils: 55 %
Platelets: 330 10*3/uL (ref 150–450)
RBC: 4.49 x10E6/uL (ref 3.77–5.28)
RDW: 13.3 % (ref 11.7–15.4)
WBC: 11 10*3/uL — ABNORMAL HIGH (ref 3.4–10.8)

## 2023-04-16 LAB — COMPREHENSIVE METABOLIC PANEL
ALT: 12 IU/L (ref 0–32)
AST: 18 IU/L (ref 0–40)
Albumin/Globulin Ratio: 2 (ref 1.2–2.2)
Albumin: 4.5 g/dL (ref 3.9–4.9)
Alkaline Phosphatase: 81 IU/L (ref 44–121)
BUN/Creatinine Ratio: 13 (ref 12–28)
BUN: 10 mg/dL (ref 8–27)
Bilirubin Total: 0.4 mg/dL (ref 0.0–1.2)
CO2: 19 mmol/L — ABNORMAL LOW (ref 20–29)
Calcium: 9.8 mg/dL (ref 8.7–10.3)
Chloride: 107 mmol/L — ABNORMAL HIGH (ref 96–106)
Creatinine, Ser: 0.76 mg/dL (ref 0.57–1.00)
Globulin, Total: 2.3 g/dL (ref 1.5–4.5)
Glucose: 92 mg/dL (ref 70–99)
Potassium: 4.3 mmol/L (ref 3.5–5.2)
Sodium: 141 mmol/L (ref 134–144)
Total Protein: 6.8 g/dL (ref 6.0–8.5)
eGFR: 88 mL/min/{1.73_m2} (ref >59)

## 2023-04-16 LAB — TSH: TSH: 3.23 u[IU]/mL (ref 0.450–4.500)

## 2023-04-16 LAB — LIPID PANEL
Chol/HDL Ratio: 2 ratio (ref 0.0–4.4)
Cholesterol, Total: 140 mg/dL (ref 100–199)
HDL: 69 mg/dL (ref >39)
LDL Chol Calc (NIH): 54 mg/dL (ref 0–99)
Triglycerides: 93 mg/dL (ref 0–149)
VLDL Cholesterol Cal: 17 mg/dL (ref 5–40)

## 2023-04-16 LAB — CARDIOVASCULAR RISK ASSESSMENT

## 2023-04-16 NOTE — Progress Notes (Signed)
Subjective:  Patient ID: Lori Wise, female    DOB: 1961-04-22  Age: 62 y.o. MRN: [id number]  Chief Complaint  Patient presents with   Medical Management of Chronic Issues    .  Hyperlipidemia    Pt presents for follow up of hypertension. The patient is tolerating the medication well without side effects. Compliance with treatment has been good; including taking medication as directed , maintains a healthy diet and regular exercise regimen , and following up as directed. She is currently on tenormin 25mg  1 po bid  Pt with history asthma/COPD - is having no problems with cough/congestion or wheezing Currently uses trelegy and albuterol - does have duoneb to use at home  Pt with history of gerd - sympotms stable on prilosec 20mg  qd -   Mixed hyperlipidemia  Pt presents with hyperlipidemia.  The patient is compliant with medications, maintains a low cholesterol diet , follows up as directed , and maintains an exercise regimen . The patient denies experiencing any hypercholesterolemia related symptoms. Pt currently on crestor 5mg  qd and zetia 10mg  qd - she also takes fish oil qd  Pt with history of hypothyroidism - due for labwork - taking synthroid qd - voices no concerns  Pt with history of anxiety - symptoms stable on lexapro and alprazolam that she takes as needed States she is having no problems or issues at this time  Pt states that she has been having moderate pain with movement to upper back and neck - sore to touch Cannot recall specific injury or trauma  Pt with history of nosebleeds .  Had appt with Dr Marcheta Grammes on 4/23 and was told she had dryness and advised to use saline spray.  She had another significant nosebleed a few days ago  Pt with complaints of dysuria and urgency for the past few days  Current Outpatient Medications on File Prior to Visit  Medication Sig Dispense Refill   albuterol (VENTOLIN HFA) 108 (90 Base) MCG/ACT inhaler Inhale 2 puffs into the lungs  every 6 (six) hours as needed for wheezing or shortness of breath. 8 g 2   ALPRAZolam (XANAX) 0.5 MG tablet TAKE ONE TABLET BY MOUTH 3 TIMES DAILY 90 tablet 0   Ascorbic Acid (VITAMIN C PO) Take 1 tablet by mouth daily. Unknown strength     atenolol (TENORMIN) 25 MG tablet Take 1 tablet (25 mg total) by mouth 2 (two) times daily. 60 tablet 5   cetirizine (ZYRTEC) 10 MG tablet Take 1 tablet (10 mg total) by mouth daily. 30 tablet 5   Cyanocobalamin (VITAMIN B-12 PO) Take 1 tablet by mouth daily. Unknown strenght     Emollient (ROC RETINOL CORREXION EX) Apply topically.     escitalopram (LEXAPRO) 10 MG tablet Take 1 tablet (10 mg total) by mouth daily. 30 tablet 2   estradiol (ESTRACE) 0.5 MG tablet Take 1 tablet (0.5 mg total) by mouth daily. 90 tablet 1   ezetimibe (ZETIA) 10 MG tablet TAKE ONE TABLET BY MOUTH EVERY DAY 30 tablet 5   ibuprofen (ADVIL) 800 MG tablet Take 1 tablet (800 mg total) by mouth every 8 (eight) hours as needed for mild pain or moderate pain. 90 tablet 1   ipratropium-albuterol (DUONEB) 0.5-2.5 (3) MG/3ML SOLN Take 3 mLs by nebulization every 4 (four) hours as needed. 360 mL 3   levothyroxine (SYNTHROID) 25 MCG tablet TAKE ONE TABLET BY MOUTH EVERY DAY 90 tablet 1   Magnesium 200 MG TABS Take 1  tablet by mouth daily.     Omega-3 1000 MG CAPS Take 1 capsule by mouth daily.     rosuvastatin (CRESTOR) 5 MG tablet Take 1 tablet (5 mg total) by mouth daily. 90 tablet 0   VITAMIN E PO Take 1 tablet by mouth daily at 6 (six) AM. Unknown strength     No current facility-administered medications on file prior to visit.   Past Medical History:  Diagnosis Date   Acute laryngopharyngitis 04/19/2020   Anxiety 04/19/2020   Atypical nevus 04/19/2020   Bronchitis 04/30/2020   Chronic obstructive pulmonary disease (HCC) 10/22/2020   Chronic obstructive pulmonary disease with (acute) lower respiratory infection (HCC)    Encounter for immunization 04/19/2020   Essential (primary)  hypertension    Gastroesophageal reflux disease 10/22/2020   Hormone replacement therapy (HRT) 10/22/2020   Migraine without aura, not intractable, without status migrainosus    Mixed hyperlipidemia    Other chest pain 03/21/2021   Other specified hypothyroidism 04/19/2020   Primary hypertension 03/21/2021   Right leg pain 07/23/2020   Sunburn of second degree    Past Surgical History:  Procedure Laterality Date   ABDOMINAL HYSTERECTOMY     CARDIAC CATHETERIZATION  2000   normal   CHOLECYSTECTOMY     NASAL HEMORRHAGE CONTROL     OTHER SURGICAL HISTORY     Ear surgeries x5    Family History  Problem Relation Age of Onset   Suicidality Mother    Lung cancer Father    Cancer Sister        Cell-1   Cancer Brother        Brain Cancer   Lung cancer Brother    Breast cancer Neg Hx    Social History   Socioeconomic History   Marital status: Legally Separated    Spouse name: Not on file   Number of children: 2   Years of education: Not on file   Highest education level: Not on file  Occupational History   Occupation: [employer]  Tobacco Use   Smoking status: Every Day    Packs/day: 1    Types: Cigarettes   Smokeless tobacco: Never  Vaping Use   Vaping Use: Never used  Substance and Sexual Activity   Alcohol use: Not Currently    Comment: Drinks approximately two times per week.   Drug use: Never   Sexual activity: Not on file  Other Topics Concern   Not on file  Social History Narrative   Not on file   Social Determinants of Health   Financial Resource Strain: Not on file  Food Insecurity: Not on file  Transportation Needs: Not on file  Physical Activity: Not on file  Stress: Not on file  Social Connections: Not on file   CONSTITUTIONAL: Negative for chills, fatigue, fever, unintentional weight gain and unintentional weight loss.  E/N/T: see HPI CARDIOVASCULAR: Negative for chest pain, dizziness, palpitations and pedal edema.  RESPIRATORY: Negative  for recent cough and dyspnea.  GASTROINTESTINAL: Negative for abdominal pain, acid reflux symptoms, constipation, diarrhea, nausea and vomiting.  MSK: see HPI INTEGUMENTARY: Negative for rash.  NEUROLOGICAL: Negative for dizziness and headaches.  PSYCHIATRIC: Negative for sleep disturbance and to question depression screen.  Negative for depression, negative for anhedonia.       Objective:  PHYSICAL EXAM:     04/15/2023    9:48 AM 10/16/2022    9:30 AM 01/08/2022    1:45 PM 02/01/2021    9:44 AM  Depression screen PHQ 2/9  Decreased Interest 0 0 0 0  Down, Depressed, Hopeless 0 0 0 0  PHQ - 2 Score 0 0 0 0  Altered sleeping 0 0    Tired, decreased energy 1 0    Change in appetite 0 0    Feeling bad or failure about yourself  0 0    Trouble concentrating 0 0    Moving slowly or fidgety/restless 0 0    Suicidal thoughts 0 0    PHQ-9 Score 1 0    Difficult doing work/chores Not difficult at all Not difficult at all    PHYSICAL EXAM:   VS: BP 112/70 (BP Location: Left Arm, Patient Position: Sitting, Cuff Size: Normal)   Pulse 81   Temp (!) 97.1 F (36.2 C) (Temporal)   Ht 5\' 5"  (1.651 m)   Wt 150 lb (68 kg)   SpO2 91%   BMI 24.96 kg/m   GEN: Well nourished, well developed, in no acute distress  HEENT: normal external ears and nose - normal external auditory canals and TMS -nasal mucosa dry and right septum with irregularity noted - Lips, Teeth and Gums - normal  Oropharynx - normal mucosa, palate, and posterior pharynx Neck: no JVD or masses - no thyromegaly Cardiac: RRR; no murmurs, rubs, or gallops,no edema - no significant varicosities Respiratory:  normal respiratory rate and pattern with no distress - normal breath sounds with no rales, rhonchi, wheezes or rubs GI: normal bowel sounds, no masses or tenderness MS: tender to upper neck and back to palpation Skin: warm and dry, no rash  Neuro:  Alert and Oriented x 3, Strength and sensation are intact - CN II-Xii grossly  intact Psych: euthymic mood, appropriate affect and demeanor    Lab Results  Component Value Date   WBC 11.0 (H) 04/15/2023   HGB 13.6 04/15/2023   HCT 41.0 04/15/2023   PLT 330 04/15/2023   GLUCOSE 92 04/15/2023   CHOL 140 04/15/2023   TRIG 93 04/15/2023   HDL 69 04/15/2023   LDLCALC 54 04/15/2023   ALT 12 04/15/2023   AST 18 04/15/2023   NA 141 04/15/2023   K 4.3 04/15/2023   CL 107 (H) 04/15/2023   CREATININE 0.76 04/15/2023   BUN 10 04/15/2023   CO2 19 (L) 04/15/2023   TSH 3.230 04/15/2023      Assessment & Plan:   Problem List Items Addressed This Visit       Cardiovascular and Mediastinum   Primary hypertension   Relevant Orders   CBC with Differential/Platelet   Comprehensive metabolic panel Continue meds Low salt diet     Respiratory   Chronic obstructive pulmonary disease with (acute) lower respiratory infection (HCC) Continue inhalers as directed     Digestive   GERD without esophagitis - Primary Continue prilosec 20mg  qd     Endocrine   Other specified hypothyroidism   Relevant Orders   TSH Continue synthroid qd     Other   Mixed hyperlipidemia   Relevant Orders   Lipid panel Continue fish oil, zetia and crestor Low chol diet   Anxiety Continue lexapro qd and ativan prn   Vitamin D deficiency   Relevant Orders   VITAMIN D 25 Hydroxy (Vit-D Deficiency, Fractures)  History of Nosebleed Use AYR gel bid - recheck in 3 weeks  Acute cystitis Rx for maccrobid as directed Urine culture pending  .  Meds ordered this encounter  Medications   cyclobenzaprine (FLEXERIL) 5  MG tablet    Sig: Take 1 tablet (5 mg total) by mouth at bedtime.    Dispense:  30 tablet    Refill:  1    Order Specific Question:   Supervising Provider    Answer:   Corey Harold   Fluticasone-Umeclidin-Vilant (TRELEGY ELLIPTA) 200-62.5-25 MCG/ACT AEPB    Sig: Inhale 1 Inhalation into the lungs daily.    Dispense:  1 each    Refill:  5    Order  Specific Question:   Supervising Provider    Answer:   Corey Harold   omeprazole (PRILOSEC) 20 MG capsule    Sig: Take 1 capsule (20 mg total) by mouth daily.    Dispense:  30 capsule    Refill:  3    Order Specific Question:   Supervising Provider    Answer:   Corey Harold   nitrofurantoin, macrocrystal-monohydrate, (MACROBID) 100 MG capsule    Sig: Take 1 capsule (100 mg total) by mouth 2 (two) times daily.    Dispense:  20 capsule    Refill:  0    Order Specific Question:   Supervising Provider    AnswerCorey Harold     Orders Placed This Encounter  Procedures   Urine Culture   CBC with Differential/Platelet   Comprehensive metabolic panel   TSH   Lipid panel   VITAMIN D 25 Hydroxy (Vit-D Deficiency, Fractures)   Cardiovascular Risk Assessment   POCT URINALYSIS DIP (CLINITEK)     Follow-up: Return in about 3 months (around 07/16/2023) for chronic fasting and in 3 weeks office visit follow up.  An After Visit Summary was printed and given to the patient.  Jettie Pagan [facility] [phone]

## 2023-04-21 LAB — URINE CULTURE

## 2023-04-22 ENCOUNTER — Other Ambulatory Visit: Payer: Self-pay | Admitting: Family Medicine

## 2023-04-22 ENCOUNTER — Other Ambulatory Visit: Payer: Self-pay | Admitting: Physician Assistant

## 2023-04-22 DIAGNOSIS — E782 Mixed hyperlipidemia: Secondary | ICD-10-CM

## 2023-04-22 DIAGNOSIS — J3089 Other allergic rhinitis: Secondary | ICD-10-CM

## 2023-04-22 DIAGNOSIS — I10 Essential (primary) hypertension: Secondary | ICD-10-CM

## 2023-04-22 MED ORDER — DOXYCYCLINE HYCLATE 100 MG PO TABS
100.0000 mg | ORAL_TABLET | Freq: Two times a day (BID) | ORAL | 0 refills | Status: DC
Start: 2023-04-22 — End: 2023-05-06

## 2023-05-06 ENCOUNTER — Encounter: Payer: Self-pay | Admitting: Physician Assistant

## 2023-05-06 ENCOUNTER — Other Ambulatory Visit: Payer: Self-pay | Admitting: Physician Assistant

## 2023-05-06 ENCOUNTER — Ambulatory Visit (INDEPENDENT_AMBULATORY_CARE_PROVIDER_SITE_OTHER): Payer: Commercial Managed Care - HMO | Admitting: Physician Assistant

## 2023-05-06 VITALS — BP 106/68 | HR 77 | Temp 97.1°F | Ht 65.0 in | Wt 146.0 lb

## 2023-05-06 DIAGNOSIS — N3001 Acute cystitis with hematuria: Secondary | ICD-10-CM | POA: Diagnosis not present

## 2023-05-06 DIAGNOSIS — R040 Epistaxis: Secondary | ICD-10-CM | POA: Insufficient documentation

## 2023-05-06 DIAGNOSIS — F419 Anxiety disorder, unspecified: Secondary | ICD-10-CM

## 2023-05-06 LAB — POCT URINALYSIS DIP (CLINITEK)
Blood, UA: NEGATIVE
Glucose, UA: NEGATIVE mg/dL
Leukocytes, UA: NEGATIVE
Nitrite, UA: NEGATIVE
POC PROTEIN,UA: 30 — AB
Spec Grav, UA: >=1.03 — AB (ref 1.010–1.025)
Urobilinogen, UA: 0.2 E.U./dL
pH, UA: 6 (ref 5.0–8.0)

## 2023-05-06 MED ORDER — AYR SALINE NASAL NA GEL
1.0000 | Freq: Every day | NASAL | 0 refills | Status: DC
Start: 2023-05-06 — End: 2024-01-11

## 2023-05-06 MED ORDER — FLUCONAZOLE 150 MG PO TABS: 150.0000 mg | ORAL_TABLET | Freq: Every day | ORAL | 1 refills | Status: DC

## 2023-05-06 NOTE — Progress Notes (Signed)
Subjective:  Patient ID: Lori Wise, female    DOB: 04/16/1961  Age: 62 y.o. MRN: [id number]  Chief Complaint  Patient presents with   Nose Problem    3 Week Follow up   Urinary Tract Infection    HPI  Pt in to recheck nose - she has had issues with recurrent epistaxis and at last visit had lesion in left nares -- she states she has had drainage and skin sloughed off from nose - she has had no further nosebleeds She is using the gel to nares  Pt with history of uti - urine culture was resistant to macrobid and was switched to doxycycline - here to repeat ua Not having any further symptoms    04/15/2023    9:48 AM 10/16/2022    9:30 AM 01/08/2022    1:45 PM 02/01/2021    9:44 AM  Depression screen PHQ 2/9  Decreased Interest 0 0 0 0  Down, Depressed, Hopeless 0 0 0 0  PHQ - 2 Score 0 0 0 0  Altered sleeping 0 0    Tired, decreased energy 1 0    Change in appetite 0 0    Feeling bad or failure about yourself  0 0    Trouble concentrating 0 0    Moving slowly or fidgety/restless 0 0    Suicidal thoughts 0 0    PHQ-9 Score 1 0    Difficult doing work/chores Not difficult at all Not difficult at all          03/21/2021   11:06 AM 01/08/2022    1:41 PM 01/21/2023    3:41 PM 04/15/2023    9:47 AM  Fall Risk  Falls in the past year? 0 0 0 0  Was there an injury with Fall? 0 0 0 0  Fall Risk Category Calculator 0 0 0 0  Fall Risk Category (Retired) Low Low    (RETIRED) Patient Fall Risk Level Low fall risk Low fall risk    Patient at Risk for Falls Due to No Fall Risks  Mental status change No Fall Risks  Fall risk Follow up Falls evaluation completed  Falls evaluation completed Falls evaluation completed     ROS CONSTITUTIONAL: has had some malaise E/N/T: Negative for ear pain, nasal congestion and sore throat.  CARDIOVASCULAR: Negative for chest pain, dizziness, palpitations  RESPIRATORY: Negative for recent cough and dyspnea.  GASTROINTESTINAL: Negative for abdominal pain,  acid reflux symptoms, constipation, diarrhea, nausea and vomiting.  GU see HPI    Current Outpatient Medications:    albuterol (VENTOLIN HFA) 108 (90 Base) MCG/ACT inhaler, Inhale 2 puffs into the lungs every 6 (six) hours as needed for wheezing or shortness of breath., Disp: 8 g, Rfl: 2   ALPRAZolam (XANAX) 0.5 MG tablet, TAKE ONE TABLET BY MOUTH 3 TIMES DAILY, Disp: 90 tablet, Rfl: 0   Ascorbic Acid (VITAMIN C PO), Take 1 tablet by mouth daily. Unknown strength, Disp: , Rfl:    atenolol (TENORMIN) 25 MG tablet, Take 1 tablet by mouth 2 times daily., Disp: 60 tablet, Rfl: 0   cetirizine (ZYRTEC) 10 MG tablet, Take 1 tablet by mouth daily., Disp: 30 tablet, Rfl: 0   Cyanocobalamin (VITAMIN B-12 PO), Take 1 tablet by mouth daily. Unknown strenght, Disp: , Rfl:    cyclobenzaprine (FLEXERIL) 5 MG tablet, Take 1 tablet (5 mg total) by mouth at bedtime., Disp: 30 tablet, Rfl: 1   Emollient (ROC RETINOL CORREXION EX), Apply topically.,  Disp: , Rfl:    escitalopram (LEXAPRO) 10 MG tablet, Take 1 tablet (10 mg total) by mouth daily., Disp: 30 tablet, Rfl: 2   estradiol (ESTRACE) 0.5 MG tablet, Take 1 tablet (0.5 mg total) by mouth daily., Disp: 90 tablet, Rfl: 1   ezetimibe (ZETIA) 10 MG tablet, TAKE ONE TABLET BY MOUTH EVERY DAY, Disp: 30 tablet, Rfl: 0   fluconazole (DIFLUCAN) 150 MG tablet, Take 1 tablet (150 mg total) by mouth daily., Disp: 1 tablet, Rfl: 1   Fluticasone-Umeclidin-Vilant (TRELEGY ELLIPTA) 200-62.5-25 MCG/ACT AEPB, Inhale 1 Inhalation into the lungs daily., Disp: 1 each, Rfl: 5   ibuprofen (ADVIL) 800 MG tablet, Take 1 tablet (800 mg total) by mouth every 8 (eight) hours as needed for mild pain or moderate pain., Disp: 90 tablet, Rfl: 1   ipratropium-albuterol (DUONEB) 0.5-2.5 (3) MG/3ML SOLN, Take 3 mLs by nebulization every 4 (four) hours as needed., Disp: 360 mL, Rfl: 3   levothyroxine (SYNTHROID) 25 MCG tablet, TAKE ONE TABLET BY MOUTH EVERY DAY, Disp: 90 tablet, Rfl: 1    Magnesium 200 MG TABS, Take 1 tablet by mouth daily., Disp: , Rfl:    Omega-3 1000 MG CAPS, Take 1 capsule by mouth daily., Disp: , Rfl:    omeprazole (PRILOSEC) 20 MG capsule, Take 1 capsule (20 mg total) by mouth daily., Disp: 30 capsule, Rfl: 3   rosuvastatin (CRESTOR) 5 MG tablet, Take 1 tablet (5 mg total) by mouth daily., Disp: 90 tablet, Rfl: 0   saline (AYR) GEL, Place 1 Application into the nose at bedtime., Disp: 14.1 g, Rfl: 0   VITAMIN E PO, Take 1 tablet by mouth daily at 6 (six) AM. Unknown strength, Disp: , Rfl:   Past Medical History:  Diagnosis Date   Acute laryngopharyngitis 04/19/2020   Anxiety 04/19/2020   Atypical nevus 04/19/2020   Bronchitis 04/30/2020   Chronic obstructive pulmonary disease (HCC) 10/22/2020   Chronic obstructive pulmonary disease with (acute) lower respiratory infection (HCC)    Encounter for immunization 04/19/2020   Essential (primary) hypertension    Gastroesophageal reflux disease 10/22/2020   Hormone replacement therapy (HRT) 10/22/2020   Migraine without aura, not intractable, without status migrainosus    Mixed hyperlipidemia    Other chest pain 03/21/2021   Other specified hypothyroidism 04/19/2020   Primary hypertension 03/21/2021   Right leg pain 07/23/2020   Sunburn of second degree    Objective:  PHYSICAL EXAM:   BP 106/68 (BP Location: Left Arm, Patient Position: Sitting, Cuff Size: Normal)   Pulse 77   Temp (!) 97.1 F (36.2 C) (Temporal)   Ht 5\' 5"  (1.651 m)   Wt 146 lb (66.2 kg)   SpO2 95%   BMI 24.30 kg/m    GEN: Well nourished, well developed, in no acute distress  HEENT: normal external ears and nose - - hearing grossly normal - nasal mucosa slightly raw but no lesion noted- Lips, Teeth and Gums - normal  Oropharynx - normal mucosa, palate, and posterior pharynx  Cardiac: RRR; no murmurs, rubs, or gallops,no edema - Respiratory:  normal respiratory rate and pattern with no distress - normal breath sounds with no rales,  rhonchi, wheezes or rubs Psych: euthymic mood, appropriate affect and demeanor Office Visit on 05/06/2023  Component Date Value Ref Range Status   Color, UA 05/06/2023 yellow  yellow Final   Clarity, UA 05/06/2023 clear  clear Final   Glucose, UA 05/06/2023 negative  negative mg/dL Final   Bilirubin, UA 09/81/1914 moderate (  A)  negative Final   Ketones, POC UA 05/06/2023 trace (5) (A)  negative mg/dL Final   Spec Grav, UA 16/09/9603 >=1.030 (A)  1.010 - 1.025 Final   Blood, UA 05/06/2023 negative  negative Final   pH, UA 05/06/2023 6.0  5.0 - 8.0 Final   POC PROTEIN,UA 05/06/2023 =30 (A)  negative, trace Final   Urobilinogen, UA 05/06/2023 0.2  0.2 or 1.0 E.U./dL Final   Nitrite, UA 54/08/8118 Negative  Negative Final   Leukocytes, UA 05/06/2023 Negative  Negative Final     Assessment & Plan:    Acute cystitis with hematuria - resolved resolved Epistaxis   Rx for AYR getl  Follow-up: Return if symptoms worsen or fail to improve.  An After Visit Summary was printed and given to the patient.  Jettie Pagan [facility] [phone]

## 2023-06-03 ENCOUNTER — Other Ambulatory Visit: Payer: Self-pay | Admitting: Family Medicine

## 2023-06-03 ENCOUNTER — Other Ambulatory Visit: Payer: Self-pay | Admitting: Physician Assistant

## 2023-06-03 DIAGNOSIS — E782 Mixed hyperlipidemia: Secondary | ICD-10-CM

## 2023-06-03 DIAGNOSIS — J3089 Other allergic rhinitis: Secondary | ICD-10-CM

## 2023-06-03 DIAGNOSIS — I10 Essential (primary) hypertension: Secondary | ICD-10-CM

## 2023-06-05 ENCOUNTER — Other Ambulatory Visit: Payer: Self-pay | Admitting: Physician Assistant

## 2023-06-05 DIAGNOSIS — F419 Anxiety disorder, unspecified: Secondary | ICD-10-CM

## 2023-07-21 ENCOUNTER — Other Ambulatory Visit: Payer: Self-pay | Admitting: Physician Assistant

## 2023-07-21 DIAGNOSIS — K219 Gastro-esophageal reflux disease without esophagitis: Secondary | ICD-10-CM

## 2023-07-21 DIAGNOSIS — Z79890 Hormone replacement therapy (postmenopausal): Secondary | ICD-10-CM

## 2023-08-03 ENCOUNTER — Other Ambulatory Visit: Payer: Self-pay | Admitting: Physician Assistant

## 2023-08-03 DIAGNOSIS — F419 Anxiety disorder, unspecified: Secondary | ICD-10-CM

## 2023-08-04 ENCOUNTER — Encounter: Payer: Self-pay | Admitting: Physician Assistant

## 2023-08-04 ENCOUNTER — Ambulatory Visit (INDEPENDENT_AMBULATORY_CARE_PROVIDER_SITE_OTHER): Payer: Commercial Managed Care - HMO | Admitting: Physician Assistant

## 2023-08-04 VITALS — BP 102/62 | HR 70 | Temp 97.3°F | Ht 65.0 in | Wt 144.0 lb

## 2023-08-04 DIAGNOSIS — I10 Essential (primary) hypertension: Secondary | ICD-10-CM

## 2023-08-04 DIAGNOSIS — E559 Vitamin D deficiency, unspecified: Secondary | ICD-10-CM

## 2023-08-04 DIAGNOSIS — E782 Mixed hyperlipidemia: Secondary | ICD-10-CM | POA: Diagnosis not present

## 2023-08-04 DIAGNOSIS — E038 Other specified hypothyroidism: Secondary | ICD-10-CM

## 2023-08-04 DIAGNOSIS — K219 Gastro-esophageal reflux disease without esophagitis: Secondary | ICD-10-CM | POA: Diagnosis not present

## 2023-08-04 DIAGNOSIS — Z1231 Encounter for screening mammogram for malignant neoplasm of breast: Secondary | ICD-10-CM

## 2023-08-04 MED ORDER — FLUTICASONE PROPIONATE 50 MCG/ACT NA SUSP: 2.0000 | Freq: Every day | NASAL | 6 refills | Status: DC

## 2023-08-04 NOTE — Progress Notes (Signed)
Subjective:  Patient ID: Lori Wise, female    DOB: Sep 11, 1961  Age: 62 y.o. MRN: [id number]  Chief Complaint  Patient presents with   Medical Management of Chronic Issues    .  Hyperlipidemia    Pt presents for follow up of hypertension. The patient is tolerating the medication well without side effects. Compliance with treatment has been good; including taking medication as directed , maintains a healthy diet and regular exercise regimen , and following up as directed. She is currently on tenormin 25mg  1 po bid  Pt with history asthma/COPD - is having no problems with cough/congestion or wheezing Currently uses symbicort and albuterol - she has rx for trelegy because symbicort is a sample but has not had filled yet She quit smoking 05/28/23  Pt with history of gerd - sympotms stable on prilosec 20mg  qd -   Mixed hyperlipidemia  Pt presents with hyperlipidemia.  The patient is compliant with medications, maintains a low cholesterol diet , follows up as directed , and maintains an exercise regimen . The patient denies experiencing any hypercholesterolemia related symptoms. Pt currently on crestor 5mg  qd and zetia 10mg  qd - she also takes fish oil qd  Pt with history of hypothyroidism - due for labwork - taking synthroid qd - voices no concerns  Pt with history of anxiety - symptoms stable on lexapro and alprazolam that she takes as needed States she is having no problems or issues at this time  Pt would like to schedule mammogram  Current Outpatient Medications on File Prior to Visit  Medication Sig Dispense Refill   albuterol (VENTOLIN HFA) 108 (90 Base) MCG/ACT inhaler Inhale 2 puffs into the lungs every 6 (six) hours as needed for wheezing or shortness of breath. 8 g 2   ALPRAZolam (XANAX) 0.5 MG tablet TAKE ONE TABLET BY MOUTH 3 TIMES DAILY 90 tablet 0   Ascorbic Acid (VITAMIN C PO) Take 1 tablet by mouth daily. Unknown strength     atenolol (TENORMIN) 25 MG tablet  TAKE ONE TABLET BY MOUTH TWICE DAILY 60 tablet 5   cetirizine (ZYRTEC) 10 MG tablet TAKE ONE TABLET BY MOUTH DAILY 30 tablet 11   Cyanocobalamin (VITAMIN B-12 PO) Take 1 tablet by mouth daily. Unknown strenght     Emollient (ROC RETINOL CORREXION EX) Apply topically.     estradiol (ESTRACE) 0.5 MG tablet Take 1 tablet (0.5 mg total) by mouth daily. 90 tablet 1   ezetimibe (ZETIA) 10 MG tablet TAKE ONE TABLET BY MOUTH EVERY DAY 30 tablet 5   fluconazole (DIFLUCAN) 150 MG tablet Take 1 tablet (150 mg total) by mouth daily. 1 tablet 1   Fluticasone-Umeclidin-Vilant (TRELEGY ELLIPTA) 200-62.5-25 MCG/ACT AEPB Inhale 1 Inhalation into the lungs daily. 1 each 5   ibuprofen (ADVIL) 800 MG tablet Take 1 tablet (800 mg total) by mouth every 8 (eight) hours as needed for mild pain or moderate pain. 90 tablet 1   ipratropium-albuterol (DUONEB) 0.5-2.5 (3) MG/3ML SOLN Take 3 mLs by nebulization every 4 (four) hours as needed. 360 mL 3   levothyroxine (SYNTHROID) 25 MCG tablet TAKE ONE TABLET BY MOUTH EVERY DAY 90 tablet 1   Magnesium 200 MG TABS Take 1 tablet by mouth daily.     Omega-3 1000 MG CAPS Take 1 capsule by mouth daily.     omeprazole (PRILOSEC) 20 MG capsule Take 1 capsule (20 mg total) by mouth 2 (two) times daily before a meal. 180 capsule 1  rosuvastatin (CRESTOR) 5 MG tablet Take 1 tablet (5 mg total) by mouth daily. 90 tablet 0   saline (AYR) GEL Place 1 Application into the nose at bedtime. 14.1 g 0   tretinoin (RETIN-A) 0.05 % cream Apply topically at bedtime.     VITAMIN E PO Take 1 tablet by mouth daily at 6 (six) AM. Unknown strength     No current facility-administered medications on file prior to visit.   Past Medical History:  Diagnosis Date   Acute laryngopharyngitis 04/19/2020   Anxiety 04/19/2020   Atypical nevus 04/19/2020   Bronchitis 04/30/2020   Chronic obstructive pulmonary disease (HCC) 10/22/2020   Chronic obstructive pulmonary disease with (acute) lower respiratory  infection (HCC)    Encounter for immunization 04/19/2020   Essential (primary) hypertension    Gastroesophageal reflux disease 10/22/2020   Hormone replacement therapy (HRT) 10/22/2020   Migraine without aura, not intractable, without status migrainosus    Mixed hyperlipidemia    Other chest pain 03/21/2021   Other specified hypothyroidism 04/19/2020   Primary hypertension 03/21/2021   Right leg pain 07/23/2020   Sunburn of second degree    Past Surgical History:  Procedure Laterality Date   ABDOMINAL HYSTERECTOMY     CARDIAC CATHETERIZATION  2000   normal   CHOLECYSTECTOMY     NASAL HEMORRHAGE CONTROL     OTHER SURGICAL HISTORY     Ear surgeries x5    Family History  Problem Relation Age of Onset   Suicidality Mother    Lung cancer Father    Cancer Sister        Cell-1   Cancer Brother        Brain Cancer   Lung cancer Brother    Breast cancer Neg Hx    Social History   Socioeconomic History   Marital status: Legally Separated    Spouse name: Not on file   Number of children: 2   Years of education: Not on file   Highest education level: Not on file  Occupational History   Occupation: [employer] service  Tobacco Use   Smoking status: Every Day    Current packs/day: 1.00    Types: Cigarettes   Smokeless tobacco: Never  Vaping Use   Vaping status: Never Used  Substance and Sexual Activity   Alcohol use: Not Currently    Comment: Drinks approximately two times per week.   Drug use: Never   Sexual activity: Not on file  Other Topics Concern   Not on file  Social History Narrative   Not on file   Social Determinants of Health   Financial Resource Strain: Low Risk  (08/04/2023)   Overall Financial Resource Strain (CARDIA)    Difficulty of Paying Living Expenses: Not hard at all  Food Insecurity: No Food Insecurity (08/04/2023)   Hunger Vital Sign    Worried About Running Out of Food in the Last Year: Never true    Ran Out of Food in the Last Year: Never true   Transportation Needs: No Transportation Needs (08/04/2023)   PRAPARE - Administrator, Civil Service (Medical): No    Lack of Transportation (Non-Medical): No  Physical Activity: Inactive (08/04/2023)   Exercise Vital Sign    Days of Exercise per Week: 0 days    Minutes of Exercise per Session: 0 min  Stress: No Stress Concern Present (08/04/2023)   [employer] of Occupational Health - Occupational Stress Questionnaire    Feeling of Stress : Not  at all  Social Connections: Unknown (08/04/2023)   Social Connection and Isolation Panel [NHANES]    Frequency of Communication with Friends and Family: More than three times a week    Frequency of Social Gatherings with Friends and Family: Three times a week    Attends Religious Services: Patient declined    Active Member of Clubs or Organizations: No    Attends Banker Meetings: Never    Marital Status: Patient declined   CONSTITUTIONAL: Negative for chills, fatigue, fever, unintentional weight gain and unintentional weight loss.  E/N/T: Negative for ear pain, nasal congestion and sore throat.  CARDIOVASCULAR: Negative for chest pain, dizziness, palpitations and pedal edema.  RESPIRATORY: Negative for recent cough and dyspnea.  GASTROINTESTINAL: Negative for abdominal pain, acid reflux symptoms, constipation, diarrhea, nausea and vomiting.  MSK: Negative for arthralgias and myalgias.  INTEGUMENTARY: Negative for rash.  NEUROLOGICAL: Negative for dizziness and headaches.  PSYCHIATRIC: Negative for sleep disturbance and to question depression screen.  Negative for depression, negative for anhedonia.        Objective:  PHYSICAL EXAM:     08/04/2023   10:39 AM 04/15/2023    9:48 AM 10/16/2022    9:30 AM 01/08/2022    1:45 PM 02/01/2021    9:44 AM  Depression screen PHQ 2/9  Decreased Interest 0 0 0 0 0  Down, Depressed, Hopeless 0 0 0 0 0  PHQ - 2 Score 0 0 0 0 0  Altered sleeping 1 0 0    Tired, decreased  energy 1 1 0    Change in appetite 1 0 0    Feeling bad or failure about yourself  0 0 0    Trouble concentrating 0 0 0    Moving slowly or fidgety/restless 0 0 0    Suicidal thoughts 0 0 0    PHQ-9 Score 3 1 0    Difficult doing work/chores Not difficult at all Not difficult at all Not difficult at all     PHYSICAL EXAM:   VS: BP 102/62 (BP Location: Right Arm, Patient Position: Sitting, Cuff Size: Normal)   Pulse 70   Temp (!) 97.3 F (36.3 C) (Temporal)   Ht 5\' 5"  (1.651 m)   Wt 144 lb (65.3 kg)   SpO2 97%   BMI 23.96 kg/m   GEN: Well nourished, well developed, in no acute distress  Cardiac: RRR; no murmurs, rubs, or gallops,no edema - Respiratory:  normal respiratory rate and pattern with no distress - normal breath sounds with no rales, rhonchi, wheezes or rubs GI: normal bowel sounds, no masses or tenderness MS: no deformity or atrophy  Skin: warm and dry, no rash  Psych: euthymic mood, appropriate affect and demeanor    Lab Results  Component Value Date   WBC 11.0 (H) 04/15/2023   HGB 13.6 04/15/2023   HCT 41.0 04/15/2023   PLT 330 04/15/2023   GLUCOSE 92 04/15/2023   CHOL 140 04/15/2023   TRIG 93 04/15/2023   HDL 69 04/15/2023   LDLCALC 54 04/15/2023   ALT 12 04/15/2023   AST 18 04/15/2023   NA 141 04/15/2023   K 4.3 04/15/2023   CL 107 (H) 04/15/2023   CREATININE 0.76 04/15/2023   BUN 10 04/15/2023   CO2 19 (L) 04/15/2023   TSH 3.230 04/15/2023      Assessment & Plan:   Problem List Items Addressed This Visit       Cardiovascular and Mediastinum   Primary hypertension  Relevant Orders   CBC with Differential/Platelet   Comprehensive metabolic panel Continue meds Low salt diet     Respiratory   Chronic obstructive pulmonary disease with (acute) lower respiratory infection (HCC) Continue inhalers as directed     Digestive   GERD without esophagitis - Primary Continue prilosec 20mg  qd     Endocrine   Other specified hypothyroidism    Relevant Orders   TSH Continue synthroid qd     Other   Mixed hyperlipidemia   Relevant Orders   Lipid panel Continue fish oil, zetia and crestor Low chol diet   Anxiety Continue lexapro qd and ativan prn   Vitamin D deficiency   Relevant Orders   VITAMIN D 25 Hydroxy (Vit-D Deficiency, Fractures)  Screening for breast cancer Mammogram ordered  .  Meds ordered this encounter  Medications   fluticasone (FLONASE) 50 MCG/ACT nasal spray    Sig: Place 2 sprays into both nostrils daily.    Dispense:  16 g    Refill:  6    Order Specific Question:   Supervising Provider    AnswerCorey Harold    Orders Placed This Encounter  Procedures   MM 3D SCREENING MAMMOGRAM BILATERAL BREAST   CBC with Differential/Platelet   Comprehensive metabolic panel   TSH   Lipid panel   VITAMIN D 25 Hydroxy (Vit-D Deficiency, Fractures)     Follow-up: Return in about 4 months (around 12/04/2023) for chronic fasting follow-up.  An After Visit Summary was printed and given to the patient.  Jettie Pagan [facility] [phone]

## 2023-08-05 LAB — LIPID PANEL
Chol/HDL Ratio: 2.5 ratio (ref 0.0–4.4)
Cholesterol, Total: 146 mg/dL (ref 100–199)
HDL: 58 mg/dL (ref >39)
LDL Chol Calc (NIH): 65 mg/dL (ref 0–99)
Triglycerides: 131 mg/dL (ref 0–149)
VLDL Cholesterol Cal: 23 mg/dL (ref 5–40)

## 2023-08-05 LAB — COMPREHENSIVE METABOLIC PANEL
ALT: 18 IU/L (ref 0–32)
AST: 23 IU/L (ref 0–40)
Albumin: 4.2 g/dL (ref 3.9–4.9)
Alkaline Phosphatase: 75 IU/L (ref 44–121)
BUN/Creatinine Ratio: 10 — ABNORMAL LOW (ref 12–28)
BUN: 8 mg/dL (ref 8–27)
Bilirubin Total: 0.4 mg/dL (ref 0.0–1.2)
CO2: 22 mmol/L (ref 20–29)
Calcium: 9.6 mg/dL (ref 8.7–10.3)
Chloride: 108 mmol/L — ABNORMAL HIGH (ref 96–106)
Creatinine, Ser: 0.78 mg/dL (ref 0.57–1.00)
Globulin, Total: 2.4 g/dL (ref 1.5–4.5)
Glucose: 99 mg/dL (ref 70–99)
Potassium: 4.1 mmol/L (ref 3.5–5.2)
Sodium: 143 mmol/L (ref 134–144)
Total Protein: 6.6 g/dL (ref 6.0–8.5)
eGFR: 85 mL/min/{1.73_m2} (ref >59)

## 2023-08-05 LAB — CBC WITH DIFFERENTIAL/PLATELET
Basophils Absolute: 0.1 10*3/uL (ref 0.0–0.2)
Basos: 1 %
EOS (ABSOLUTE): 0.3 10*3/uL (ref 0.0–0.4)
Eos: 3 %
Hematocrit: 39.1 % (ref 34.0–46.6)
Hemoglobin: 13.3 g/dL (ref 11.1–15.9)
Immature Grans (Abs): 0.1 10*3/uL (ref 0.0–0.1)
Immature Granulocytes: 1 %
Lymphocytes Absolute: 4.4 10*3/uL — ABNORMAL HIGH (ref 0.7–3.1)
Lymphs: 40 %
MCH: 30.2 pg (ref 26.6–33.0)
MCHC: 34 g/dL (ref 31.5–35.7)
MCV: 89 fL (ref 79–97)
Monocytes Absolute: 0.8 10*3/uL (ref 0.1–0.9)
Monocytes: 8 %
Neutrophils Absolute: 5.2 10*3/uL (ref 1.4–7.0)
Neutrophils: 47 %
Platelets: 325 10*3/uL (ref 150–450)
RBC: 4.4 x10E6/uL (ref 3.77–5.28)
RDW: 13.1 % (ref 11.7–15.4)
WBC: 10.9 10*3/uL — ABNORMAL HIGH (ref 3.4–10.8)

## 2023-08-05 LAB — VITAMIN D 25 HYDROXY (VIT D DEFICIENCY, FRACTURES): Vit D, 25-Hydroxy: 38 ng/mL (ref 30.0–100.0)

## 2023-08-05 LAB — TSH: TSH: 2.16 u[IU]/mL (ref 0.450–4.500)

## 2023-09-01 ENCOUNTER — Other Ambulatory Visit: Payer: Self-pay | Admitting: Physician Assistant

## 2023-09-01 DIAGNOSIS — F419 Anxiety disorder, unspecified: Secondary | ICD-10-CM

## 2023-09-05 ENCOUNTER — Other Ambulatory Visit: Payer: Self-pay | Admitting: Physician Assistant

## 2023-09-05 DIAGNOSIS — E782 Mixed hyperlipidemia: Secondary | ICD-10-CM

## 2023-09-16 ENCOUNTER — Ambulatory Visit
Admission: RE | Admit: 2023-09-16 | Discharge: 2023-09-16 | Disposition: A | Discharge status: Home / Self Care | Payer: Managed Care, Other (non HMO) | Source: Ambulatory Visit | Attending: Physician Assistant | Admitting: Physician Assistant

## 2023-09-16 DIAGNOSIS — Z1231 Encounter for screening mammogram for malignant neoplasm of breast: Secondary | ICD-10-CM

## 2023-09-24 ENCOUNTER — Other Ambulatory Visit: Payer: Self-pay | Admitting: Physician Assistant

## 2023-09-24 DIAGNOSIS — R928 Other abnormal and inconclusive findings on diagnostic imaging of breast: Secondary | ICD-10-CM

## 2023-10-01 ENCOUNTER — Other Ambulatory Visit: Payer: Self-pay | Admitting: Physician Assistant

## 2023-10-01 DIAGNOSIS — F419 Anxiety disorder, unspecified: Secondary | ICD-10-CM

## 2023-10-02 ENCOUNTER — Ambulatory Visit (INDEPENDENT_AMBULATORY_CARE_PROVIDER_SITE_OTHER): Payer: Managed Care, Other (non HMO)

## 2023-10-02 DIAGNOSIS — Z23 Encounter for immunization: Secondary | ICD-10-CM

## 2023-10-13 ENCOUNTER — Other Ambulatory Visit: Payer: Self-pay | Admitting: Physician Assistant

## 2023-10-13 ENCOUNTER — Ambulatory Visit
Admission: RE | Admit: 2023-10-13 | Discharge: 2023-10-13 | Disposition: A | Discharge status: Home / Self Care | Payer: Managed Care, Other (non HMO) | Source: Ambulatory Visit | Attending: Physician Assistant | Admitting: Physician Assistant

## 2023-10-13 DIAGNOSIS — R928 Other abnormal and inconclusive findings on diagnostic imaging of breast: Secondary | ICD-10-CM

## 2023-10-13 DIAGNOSIS — N6310 Unspecified lump in the right breast, unspecified quadrant: Secondary | ICD-10-CM

## 2023-10-13 DIAGNOSIS — N6320 Unspecified lump in the left breast, unspecified quadrant: Secondary | ICD-10-CM

## 2023-10-13 DIAGNOSIS — N6489 Other specified disorders of breast: Secondary | ICD-10-CM

## 2023-10-15 ENCOUNTER — Ambulatory Visit
Admission: RE | Admit: 2023-10-15 | Discharge: 2023-10-15 | Disposition: A | Discharge status: Home / Self Care | Payer: Managed Care, Other (non HMO) | Source: Ambulatory Visit | Attending: Physician Assistant | Admitting: Physician Assistant

## 2023-10-15 DIAGNOSIS — R928 Other abnormal and inconclusive findings on diagnostic imaging of breast: Secondary | ICD-10-CM

## 2023-10-15 DIAGNOSIS — N6489 Other specified disorders of breast: Secondary | ICD-10-CM

## 2023-10-15 DIAGNOSIS — N6320 Unspecified lump in the left breast, unspecified quadrant: Secondary | ICD-10-CM

## 2023-10-15 DIAGNOSIS — N6310 Unspecified lump in the right breast, unspecified quadrant: Secondary | ICD-10-CM

## 2023-10-15 HISTORY — PX: BREAST BIOPSY: SHX20

## 2023-10-16 ENCOUNTER — Telehealth: Payer: Self-pay | Admitting: Hematology and Oncology

## 2023-10-16 LAB — SURGICAL PATHOLOGY

## 2023-10-16 NOTE — Telephone Encounter (Signed)
Spoke to patient to confirm upcoming afternoon [facility] clinic appointment on 11/13, paperwork will be NOT sent, patient will fill out when she arrives for appointment.   Gave location and time, also informed patient that the surgeon's office would be calling as well to get information from them similar to the packet that they will be receiving so make sure to do both.  Reminded patient that all providers will be coming to the clinic to see them HERE and if they had any questions to not hesitate to reach back out to myself or their navigators.

## 2023-10-19 ENCOUNTER — Encounter: Payer: Self-pay | Admitting: Genetic Counselor

## 2023-10-19 ENCOUNTER — Encounter: Payer: Self-pay | Admitting: *Deleted

## 2023-10-19 DIAGNOSIS — C50311 Malignant neoplasm of lower-inner quadrant of right female breast: Secondary | ICD-10-CM | POA: Insufficient documentation

## 2023-10-20 ENCOUNTER — Encounter: Payer: Self-pay | Admitting: *Deleted

## 2023-10-21 ENCOUNTER — Encounter: Payer: Self-pay | Admitting: Physical Therapy

## 2023-10-21 ENCOUNTER — Inpatient Hospital Stay: Payer: Commercial Managed Care - HMO | Attending: Hematology and Oncology

## 2023-10-21 ENCOUNTER — Encounter: Payer: Self-pay | Admitting: *Deleted

## 2023-10-21 ENCOUNTER — Encounter: Payer: Self-pay | Admitting: General Practice

## 2023-10-21 ENCOUNTER — Inpatient Hospital Stay: Payer: Commercial Managed Care - HMO

## 2023-10-21 ENCOUNTER — Inpatient Hospital Stay (HOSPITAL_BASED_OUTPATIENT_CLINIC_OR_DEPARTMENT_OTHER): Payer: Commercial Managed Care - HMO | Admitting: Hematology and Oncology

## 2023-10-21 ENCOUNTER — Other Ambulatory Visit: Payer: Self-pay | Admitting: Surgery

## 2023-10-21 ENCOUNTER — Other Ambulatory Visit: Payer: Self-pay

## 2023-10-21 ENCOUNTER — Ambulatory Visit
Admission: RE | Admit: 2023-10-21 | Discharge: 2023-10-21 | Disposition: A | Discharge status: Home / Self Care | Payer: Managed Care, Other (non HMO) | Source: Ambulatory Visit | Attending: Radiation Oncology | Admitting: Radiation Oncology

## 2023-10-21 ENCOUNTER — Inpatient Hospital Stay (HOSPITAL_BASED_OUTPATIENT_CLINIC_OR_DEPARTMENT_OTHER): Payer: Commercial Managed Care - HMO | Admitting: Genetic Counselor

## 2023-10-21 ENCOUNTER — Ambulatory Visit: Payer: Commercial Managed Care - HMO | Attending: Surgery | Admitting: Physical Therapy

## 2023-10-21 VITALS — BP 135/51 | HR 60 | Temp 97.2°F | Resp 18 | Ht 65.0 in | Wt 144.6 lb

## 2023-10-21 DIAGNOSIS — Z170 Estrogen receptor positive status [ER+]: Secondary | ICD-10-CM

## 2023-10-21 DIAGNOSIS — I10 Essential (primary) hypertension: Secondary | ICD-10-CM | POA: Diagnosis not present

## 2023-10-21 DIAGNOSIS — Z801 Family history of malignant neoplasm of trachea, bronchus and lung: Secondary | ICD-10-CM | POA: Diagnosis not present

## 2023-10-21 DIAGNOSIS — C50311 Malignant neoplasm of lower-inner quadrant of right female breast: Secondary | ICD-10-CM | POA: Diagnosis present

## 2023-10-21 DIAGNOSIS — Z853 Personal history of malignant neoplasm of breast: Secondary | ICD-10-CM

## 2023-10-21 DIAGNOSIS — Z809 Family history of malignant neoplasm, unspecified: Secondary | ICD-10-CM

## 2023-10-21 DIAGNOSIS — Z803 Family history of malignant neoplasm of breast: Secondary | ICD-10-CM

## 2023-10-21 DIAGNOSIS — Z1721 Progesterone receptor positive status: Secondary | ICD-10-CM

## 2023-10-21 DIAGNOSIS — E782 Mixed hyperlipidemia: Secondary | ICD-10-CM | POA: Insufficient documentation

## 2023-10-21 DIAGNOSIS — E038 Other specified hypothyroidism: Secondary | ICD-10-CM | POA: Diagnosis not present

## 2023-10-21 DIAGNOSIS — Z79890 Hormone replacement therapy (postmenopausal): Secondary | ICD-10-CM

## 2023-10-21 DIAGNOSIS — Z1732 Human epidermal growth factor receptor 2 negative status: Secondary | ICD-10-CM | POA: Diagnosis not present

## 2023-10-21 DIAGNOSIS — J449 Chronic obstructive pulmonary disease, unspecified: Secondary | ICD-10-CM

## 2023-10-21 DIAGNOSIS — Z9049 Acquired absence of other specified parts of digestive tract: Secondary | ICD-10-CM | POA: Insufficient documentation

## 2023-10-21 DIAGNOSIS — Z885 Allergy status to narcotic agent status: Secondary | ICD-10-CM | POA: Insufficient documentation

## 2023-10-21 DIAGNOSIS — Z808 Family history of malignant neoplasm of other organs or systems: Secondary | ICD-10-CM | POA: Diagnosis not present

## 2023-10-21 DIAGNOSIS — Z881 Allergy status to other antibiotic agents status: Secondary | ICD-10-CM | POA: Insufficient documentation

## 2023-10-21 DIAGNOSIS — Z90710 Acquired absence of both cervix and uterus: Secondary | ICD-10-CM | POA: Diagnosis not present

## 2023-10-21 DIAGNOSIS — Z79899 Other long term (current) drug therapy: Secondary | ICD-10-CM | POA: Diagnosis not present

## 2023-10-21 DIAGNOSIS — Z87891 Personal history of nicotine dependence: Secondary | ICD-10-CM

## 2023-10-21 DIAGNOSIS — R293 Abnormal posture: Secondary | ICD-10-CM | POA: Diagnosis not present

## 2023-10-21 DIAGNOSIS — K219 Gastro-esophageal reflux disease without esophagitis: Secondary | ICD-10-CM | POA: Insufficient documentation

## 2023-10-21 DIAGNOSIS — J440 Chronic obstructive pulmonary disease with acute lower respiratory infection: Secondary | ICD-10-CM | POA: Insufficient documentation

## 2023-10-21 DIAGNOSIS — Z8042 Family history of malignant neoplasm of prostate: Secondary | ICD-10-CM

## 2023-10-21 DIAGNOSIS — E785 Hyperlipidemia, unspecified: Secondary | ICD-10-CM

## 2023-10-21 LAB — CBC WITH DIFFERENTIAL (CANCER CENTER ONLY)
Abs Immature Granulocytes: 0.05 10*3/uL (ref 0.00–0.07)
Basophils Absolute: 0.1 10*3/uL (ref 0.0–0.1)
Basophils Relative: 1 %
Eosinophils Absolute: 0.3 10*3/uL (ref 0.0–0.5)
Eosinophils Relative: 3 %
HCT: 37 % (ref 36.0–46.0)
Hemoglobin: 12.4 g/dL (ref 12.0–15.0)
Immature Granulocytes: 1 %
Lymphocytes Relative: 43 %
Lymphs Abs: 4.2 10*3/uL — ABNORMAL HIGH (ref 0.7–4.0)
MCH: 30.5 pg (ref 26.0–34.0)
MCHC: 33.5 g/dL (ref 30.0–36.0)
MCV: 90.9 fL (ref 80.0–100.0)
Monocytes Absolute: 0.8 10*3/uL (ref 0.1–1.0)
Monocytes Relative: 8 %
Neutro Abs: 4.2 10*3/uL (ref 1.7–7.7)
Neutrophils Relative %: 44 %
Platelet Count: 285 10*3/uL (ref 150–400)
RBC: 4.07 MIL/uL (ref 3.87–5.11)
RDW: 12.9 % (ref 11.5–15.5)
WBC Count: 9.5 10*3/uL (ref 4.0–10.5)
nRBC: 0 % (ref 0.0–0.2)

## 2023-10-21 LAB — CMP (CANCER CENTER ONLY)
ALT: 14 U/L (ref 0–44)
AST: 19 U/L (ref 15–41)
Albumin: 4.2 g/dL (ref 3.5–5.0)
Alkaline Phosphatase: 64 U/L (ref 38–126)
Anion gap: 5 (ref 5–15)
BUN: 9 mg/dL (ref 8–23)
CO2: 28 mmol/L (ref 22–32)
Calcium: 9.6 mg/dL (ref 8.9–10.3)
Chloride: 110 mmol/L (ref 98–111)
Creatinine: 0.71 mg/dL (ref 0.44–1.00)
GFR, Estimated: >60 mL/min (ref >60)
Glucose, Bld: 100 mg/dL — ABNORMAL HIGH (ref 70–99)
Potassium: 3.3 mmol/L — ABNORMAL LOW (ref 3.5–5.1)
Sodium: 143 mmol/L (ref 135–145)
Total Bilirubin: 0.3 mg/dL (ref <1.2)
Total Protein: 6.7 g/dL (ref 6.5–8.1)

## 2023-10-21 LAB — GENETIC SCREENING ORDER

## 2023-10-21 LAB — RESEARCH LABS

## 2023-10-21 NOTE — Progress Notes (Signed)
[facility] Multidisciplinary Clinic Spiritual Care Note  Met with Lori Wise, two daughters, and husband in [facility] Multidisciplinary Clinic to introduce Support Center team/resources.  She completed SDOH screening; results follow below.  SDOH Interventions    Flowsheet Row Office Visit from 08/04/2023 in [facility]  SDOH Interventions   Food Insecurity Interventions Intervention Not Indicated  Housing Interventions Intervention Not Indicated  Transportation Interventions Intervention Not Indicated  Utilities Interventions Intervention Not Indicated  Alcohol Usage Interventions Intervention Not Indicated (Score <7)  Financial Strain Interventions Intervention Not Indicated  Physical Activity Interventions Intervention Not Indicated  Stress Interventions Intervention Not Indicated  Social Connections Interventions Intervention Not Indicated  Health Literacy Interventions Intervention Not Indicated       SDOH Screenings   Food Insecurity: No Food Insecurity (10/21/2023)  Housing: Low Risk  (10/21/2023)  Transportation Needs: No Transportation Needs (10/21/2023)  Utilities: Not At Risk (10/21/2023)  Alcohol Screen: Low Risk  (08/04/2023)  Depression (PHQ2-9): Low Risk  (08/04/2023)  Financial Resource Strain: Low Risk  (08/04/2023)  Physical Activity: Inactive (08/04/2023)  Social Connections: Unknown (08/04/2023)  Stress: No Stress Concern Present (08/04/2023)  Tobacco Use: Medium Risk (10/21/2023)  Health Literacy: Adequate Health Literacy (08/04/2023)    Chaplain and patient discussed common feelings and emotions when being diagnosed with cancer, and the importance of support during treatment.  Chaplain informed patient of the support team and support services at [facility].  Chaplain provided contact information and encouraged patient to call with any questions or concerns.  [name] notes that she is feeling better now, thanks to information and team support through [facility]. Will add  her to email programming list and place [employer] peer mentor referral per her request. Daughters interested in supporting their mom with programming participation.  Follow up needed: Yes.  We plan to follow up by phone in two weeks for pastoral check-in, and she knows to reach out as needed/desired in the meantime, as well.   [address] Rush Barer, [place], [facility] Pager [phone] Voicemail [phone]

## 2023-10-21 NOTE — Research (Signed)
[employer] 2021-05 - Specimen Collection Study to Evaluate Biomarkers in Subjects with Cancer     This Nurse has reviewed this patient's inclusion and exclusion criteria as a second review and confirms Lori Wise is eligible for study participation.  Patient may continue with enrollment.   Janan Ridge RN, BSN, CCRP Clinical Research Nurse Lead 10/21/2023 3:28 PM

## 2023-10-21 NOTE — Progress Notes (Signed)
Radiation Oncology         ([phone]) [phone] ________________________________  Multidisciplinary Breast Oncology Clinic [facility]) Initial Outpatient Consultation  Name: Lori Wise MRN: [id number]  Date: 10/21/2023  DOB: Sep 17, 1961  VO:ZDGUY, Marlowe Alt, MD   REFERRING PHYSICIAN: Abigail Miyamoto, MD  DIAGNOSIS: The encounter diagnosis was Malignant neoplasm of lower-inner quadrant of right breast of female, estrogen receptor positive (HCC).  Stage IA (cT1c, cN0, cM0) Right Breast LIQ, Invasive Lobular Carcinoma, ER+ / PR+ / Her2-, Grade 1    ICD-10-CM   1. Malignant neoplasm of lower-inner quadrant of right breast of female, estrogen receptor positive (HCC)  C50.311    Z17.0       HISTORY OF PRESENT ILLNESS::Lori Wise is a 62 y.o. female who is presenting to the office today for evaluation of her newly diagnosed breast cancer. She is accompanied by her two daughters and husband. She is doing well overall.   She had routine screening mammography on 09/16/23 showing a possible abnormality in both breasts. She underwent bilateral diagnostic mammography with tomography and bilateral breast ultrasonography at [facility] on 10/13/23 showing: a suspicious mass in the 6 o'clock right breast measuring 0.9 x 0.8 x 1.1 cm, located 4 cmfn; three benign cysts in the 7 o'clock right breast x 1, and in the 9 o'clock right breast x 2. In the left breast, a mass was demonstrated mammographically in the lower slightly inner quadrant. Sonographic evaluation of the left breast showed a benign cyst was also appreciated in the 3 o'clock left breast, as well as a cluster of cysts in the 6 o'clock left breast. No evidence of axillary lymphadenopathy was seen in either axilla.   Biopsies collected on 10/15/23 are detailed as follows:  -- Biopsy of the central inferior right breast showed grade 1 invasive lobular carcinoma measuring 5 mm in the greatest linear extent of the sample, with  focal lobular neoplasia, and other being findings including UDH and focal duct ectasia. Prognostic indicators significant for: estrogen receptor, 95% positive and progesterone receptor, 95% positive, both with strong staining intensity. Proliferation marker Ki67 at 3%. HER2 negative. -- Biopsy of the lower inner left breast showed no evidence of carcinoma and benign findings including stromal fibrosis, cystic dilation of the ducts, UDH, and cystic papillary apocrine metaplasia.   The patient was referred today for presentation in the multidisciplinary conference.  Radiology studies and pathology slides were presented there for review and discussion of treatment options.  A consensus was discussed regarding potential next steps.  PREVIOUS RADIATION THERAPY: No  PAST MEDICAL HISTORY:  Past Medical History:  Diagnosis Date   Acute laryngopharyngitis 04/19/2020   Anxiety 04/19/2020   Atypical nevus 04/19/2020   Bronchitis 04/30/2020   Chronic obstructive pulmonary disease (HCC) 10/22/2020   Chronic obstructive pulmonary disease with (acute) lower respiratory infection (HCC)    Encounter for immunization 04/19/2020   Essential (primary) hypertension    Gastroesophageal reflux disease 10/22/2020   Hormone replacement therapy (HRT) 10/22/2020   Migraine without aura, not intractable, without status migrainosus    Mixed hyperlipidemia    Other chest pain 03/21/2021   Other specified hypothyroidism 04/19/2020   Primary hypertension 03/21/2021   Right leg pain 07/23/2020   Sunburn of second degree     PAST SURGICAL HISTORY: Past Surgical History:  Procedure Laterality Date   ABDOMINAL HYSTERECTOMY     BREAST BIOPSY Left 10/15/2023   MM LT BREAST BX W LOC DEV 1ST LESION IMAGE BX  SPEC STEREO GUIDE 10/15/2023 [facility]   BREAST BIOPSY Right 10/15/2023   MM RT BREAST BX W LOC DEV 1ST LESION IMAGE BX SPEC STEREO GUIDE 10/15/2023 [facility]   CARDIAC CATHETERIZATION  2000   normal    CHOLECYSTECTOMY     NASAL HEMORRHAGE CONTROL     OTHER SURGICAL HISTORY     Ear surgeries x5    FAMILY HISTORY:  Family History  Problem Relation Age of Onset   Suicidality Mother    Lung cancer Father    Cancer Sister        Cell-1   Brain cancer Brother    Lung cancer Brother    Breast cancer Paternal Grandmother     SOCIAL HISTORY:  Social History   Socioeconomic History   Marital status: Legally Separated    Spouse name: Not on file   Number of children: 2   Years of education: Not on file   Highest education level: Not on file  Occupational History   Occupation: [employer] service  Tobacco Use   Smoking status: Former    Current packs/day: 0.00    Types: Cigarettes    Quit date: 05/28/2023    Years since quitting: 0.4   Smokeless tobacco: Never  Vaping Use   Vaping status: Never Used  Substance and Sexual Activity   Alcohol use: Not Currently    Comment: Drinks approximately two times per week.   Drug use: Never   Sexual activity: Not on file  Other Topics Concern   Not on file  Social History Narrative   Not on file   Social Determinants of Health   Financial Resource Strain: Low Risk  (08/04/2023)   Overall Financial Resource Strain (CARDIA)    Difficulty of Paying Living Expenses: Not hard at all  Food Insecurity: No Food Insecurity (08/04/2023)   Hunger Vital Sign    Worried About Running Out of Food in the Last Year: Never true    Ran Out of Food in the Last Year: Never true  Transportation Needs: No Transportation Needs (08/04/2023)   PRAPARE - Administrator, Civil Service (Medical): No    Lack of Transportation (Non-Medical): No  Physical Activity: Inactive (08/04/2023)   Exercise Vital Sign    Days of Exercise per Week: 0 days    Minutes of Exercise per Session: 0 min  Stress: No Stress Concern Present (08/04/2023)   [employer] of Occupational Health - Occupational Stress Questionnaire    Feeling of Stress : Not at all   Social Connections: Unknown (08/04/2023)   Social Connection and Isolation Panel [NHANES]    Frequency of Communication with Friends and Family: More than three times a week    Frequency of Social Gatherings with Friends and Family: Three times a week    Attends Religious Services: Patient declined    Active Member of Clubs or Organizations: No    Attends Banker Meetings: Never    Marital Status: Patient declined    ALLERGIES:  Allergies  Allergen Reactions   Codeine Hives, Itching, Other (See Comments), Rash and Nausea And Vomiting   Macrobid [Nitrofurantoin] Other (See Comments)    GI intolerance    MEDICATIONS:  Current Outpatient Medications  Medication Sig Dispense Refill   albuterol (VENTOLIN HFA) 108 (90 Base) MCG/ACT inhaler Inhale 2 puffs into the lungs every 6 (six) hours as needed for wheezing or shortness of breath. 8 g 2   ALPRAZolam (XANAX) 0.5 MG tablet TAKE  ONE TABLET BY MOUTH 3 TIMES DAILY 90 tablet 0   Ascorbic Acid (VITAMIN C PO) Take 1 tablet by mouth daily. Unknown strength     atenolol (TENORMIN) 25 MG tablet TAKE ONE TABLET BY MOUTH TWICE DAILY 60 tablet 5   cetirizine (ZYRTEC) 10 MG tablet TAKE ONE TABLET BY MOUTH DAILY 30 tablet 11   Cyanocobalamin (VITAMIN B-12 PO) Take 1 tablet by mouth daily. Unknown strenght     Emollient (ROC RETINOL CORREXION EX) Apply topically.     estradiol (ESTRACE) 0.5 MG tablet Take 1 tablet (0.5 mg total) by mouth daily. 90 tablet 1   ezetimibe (ZETIA) 10 MG tablet TAKE ONE TABLET BY MOUTH EVERY DAY 30 tablet 5   fluconazole (DIFLUCAN) 150 MG tablet Take 1 tablet (150 mg total) by mouth daily. 1 tablet 1   fluticasone (FLONASE) 50 MCG/ACT nasal spray Place 2 sprays into both nostrils daily. 16 g 6   Fluticasone-Umeclidin-Vilant (TRELEGY ELLIPTA) 200-62.5-25 MCG/ACT AEPB Inhale 1 Inhalation into the lungs daily. 1 each 5   ibuprofen (ADVIL) 800 MG tablet Take 1 tablet (800 mg total) by mouth every 8 (eight) hours as  needed for mild pain or moderate pain. 90 tablet 1   ipratropium-albuterol (DUONEB) 0.5-2.5 (3) MG/3ML SOLN Take 3 mLs by nebulization every 4 (four) hours as needed. 360 mL 3   levothyroxine (SYNTHROID) 25 MCG tablet TAKE ONE TABLET BY MOUTH EVERY DAY 90 tablet 1   Magnesium 200 MG TABS Take 1 tablet by mouth daily.     Omega-3 1000 MG CAPS Take 1 capsule by mouth daily.     omeprazole (PRILOSEC) 20 MG capsule Take 1 capsule (20 mg total) by mouth 2 (two) times daily before a meal. 180 capsule 1   rosuvastatin (CRESTOR) 5 MG tablet Take 1 tablet (5 mg total) by mouth daily. 90 tablet 0   saline (AYR) GEL Place 1 Application into the nose at bedtime. 14.1 g 0   tretinoin (RETIN-A) 0.05 % cream Apply topically at bedtime.     VITAMIN E PO Take 1 tablet by mouth daily at 6 (six) AM. Unknown strength     No current facility-administered medications for this encounter.    REVIEW OF SYSTEMS: A 10+ POINT REVIEW OF SYSTEMS WAS OBTAINED including neurology, dermatology, psychiatry, cardiac, respiratory, lymph, extremities, GI, GU, musculoskeletal, constitutional, reproductive, HEENT.  Prior to diagnosis she denies any pain within the right breast area nipple discharge or bleeding   PHYSICAL EXAM:     10/21/2023  Vitals with BMI   Height 5\' 5"    Weight 144 lbs 10 oz   BMI 24.06   Systolic 135 !   Diastolic 51 !   Pulse 60      Lungs are clear to auscultation bilaterally. Heart has regular rate and rhythm. No palpable cervical, supraclavicular, or axillary adenopathy. Abdomen soft, non-tender, normal bowel sounds. Breast: Left breast with no palpable mass, nipple discharge, or bleeding. Right breast with brusing in the lower inner aspect of the breast with a biopsy site noted in this area. No palpable mass, nipple discharge, or bleeding appreciated in the right breast.   KPS = 90  100 - Normal; no complaints; no evidence of disease. 90   - Able to carry on normal activity; minor signs or  symptoms of disease. 80   - Normal activity with effort; some signs or symptoms of disease. 42   - Cares for self; unable to carry on normal activity or to do  active work. 60   - Requires occasional assistance, but is able to care for most of his personal needs. 50   - Requires considerable assistance and frequent medical care. 40   - Disabled; requires special care and assistance. 30   - Severely disabled; hospital admission is indicated although death not imminent. 20   - Very sick; hospital admission necessary; active supportive treatment necessary. 10   - Moribund; fatal processes progressing rapidly. 0     - Dead  Karnofsky DA, Abelmann WH, Craver LS and [facility] [phone]) The use of the nitrogen mustards in the palliative treatment of carcinoma: with particular reference to bronchogenic carcinoma Cancer 1 634-56  LABORATORY DATA:  Lab Results  Component Value Date   WBC 9.5 10/21/2023   HGB 12.4 10/21/2023   HCT 37.0 10/21/2023   MCV 90.9 10/21/2023   PLT 285 10/21/2023   Lab Results  Component Value Date   NA 143 10/21/2023   K 3.3 (L) 10/21/2023   CL 110 10/21/2023   CO2 28 10/21/2023   Lab Results  Component Value Date   ALT 14 10/21/2023   AST 19 10/21/2023   ALKPHOS 64 10/21/2023   BILITOT 0.3 10/21/2023    PULMONARY FUNCTION TEST:   Review Flowsheet        No data to display          RADIOGRAPHY: MM LT BREAST BX W LOC DEV 1ST LESION IMAGE BX SPEC STEREO GUIDE  Addendum Date: 10/19/2023   ADDENDUM REPORT: 10/19/2023 17:07 ADDENDUM: Pathology revealed GRADE I INVASIVE LOBULAR CARCINOMA, FOCAL LOBULAR NEOPLASIA, FIBROCYSTIC CHANGES INCLUDING STROMAL FIBROSIS, CYSTIC DILATATION OF DUCTS SCLEROSING ADENOSIS AND USUAL DUCT HYPERPLASIA FOCAL DUCT ECTASIA, MICROCALCIFICATIONS PRESENT WITHIN TUMOR of the RIGHT breast, central inferior, (coil clip). This was found to be concordant by Dr. Emmaline Kluver. Pathology revealed BENIGN BREAST WITH FIBROCYSTIC CHANGES  INCLUDING STROMAL FIBROSIS, CYSTIC DILATATION OF DUCTS, USUAL DUCT HYPERPLASIA AND CYSTIC PAPILLARY APOCRINE METAPLASIA, MICROCALCIFICATIONS PRESENT of the LEFT breast, lower inner, (ribbon clip). This was found to be concordant by Dr. Emmaline Kluver. Pathology results were discussed with the patient by telephone. The patient reported doing well after the biopsies with tenderness at the sites. Post biopsy instructions and care were reviewed and questions were answered. The patient was encouraged to call [facility] for any additional concerns. My direct phone number was provided. The patient was referred to [facility] on October 21, 2023. Recommendation for a bilateral breast MRI given the lobular histology. Pathology results reported by Rene Kocher, RN on 10/19/2023. Electronically Signed   By: Emmaline Kluver M.D.   On: 10/19/2023 17:07   Result Date: 10/19/2023 CLINICAL DATA:  62 year old female presenting for biopsy a mass in the left breast. EXAM: LEFT BREAST STEREOTACTIC CORE NEEDLE BIOPSY COMPARISON:  Previous exam(s). FINDINGS: The patient and I discussed the procedure of stereotactic-guided biopsy including benefits and alternatives. We discussed the high likelihood of a successful procedure. We discussed the risks of the procedure including infection, bleeding, tissue injury, clip migration, and inadequate sampling. Informed written consent was given. The usual time out protocol was performed immediately prior to the procedure. Using sterile technique and 1% Lidocaine as local anesthetic, under stereotactic guidance, a 9 gauge vacuum assisted device was used to perform core needle biopsy of a mass in the lower inner left breast using a medial approach. Lesion quadrant: Lower inner quadrant At the conclusion  of the procedure, a ribbon shaped tissue marker clip was deployed into the biopsy  cavity. Follow-up 2-view mammogram was performed and dictated separately. IMPRESSION: Stereotactic-guided biopsy of a mass in the lower inner left breast. No apparent complications. Electronically Signed: By: Emmaline Kluver M.D. On: 10/15/2023 09:13   MM RT BREAST BX W LOC DEV 1ST LESION IMAGE BX SPEC STEREO GUIDE  Addendum Date: 10/19/2023   ADDENDUM REPORT: 10/19/2023 17:07 ADDENDUM: Pathology revealed GRADE I INVASIVE LOBULAR CARCINOMA, FOCAL LOBULAR NEOPLASIA, FIBROCYSTIC CHANGES INCLUDING STROMAL FIBROSIS, CYSTIC DILATATION OF DUCTS SCLEROSING ADENOSIS AND USUAL DUCT HYPERPLASIA FOCAL DUCT ECTASIA, MICROCALCIFICATIONS PRESENT WITHIN TUMOR of the RIGHT breast, central inferior, (coil clip). This was found to be concordant by Dr. Emmaline Kluver. Pathology revealed BENIGN BREAST WITH FIBROCYSTIC CHANGES INCLUDING STROMAL FIBROSIS, CYSTIC DILATATION OF DUCTS, USUAL DUCT HYPERPLASIA AND CYSTIC PAPILLARY APOCRINE METAPLASIA, MICROCALCIFICATIONS PRESENT of the LEFT breast, lower inner, (ribbon clip). This was found to be concordant by Dr. Emmaline Kluver. Pathology results were discussed with the patient by telephone. The patient reported doing well after the biopsies with tenderness at the sites. Post biopsy instructions and care were reviewed and questions were answered. The patient was encouraged to call [facility] for any additional concerns. My direct phone number was provided. The patient was referred to [facility] on October 21, 2023. Recommendation for a bilateral breast MRI given the lobular histology. Pathology results reported by Rene Kocher, RN on 10/19/2023. Electronically Signed   By: Emmaline Kluver M.D.   On: 10/19/2023 17:07   Result Date: 10/19/2023 CLINICAL DATA:  62 year old female presenting for biopsy of distortion in the right breast. EXAM: RIGHT BREAST STEREOTACTIC CORE  NEEDLE BIOPSY COMPARISON:  Previous exam(s). FINDINGS: The patient and I discussed the procedure of stereotactic-guided biopsy including benefits and alternatives. We discussed the high likelihood of a successful procedure. We discussed the risks of the procedure including infection, bleeding, tissue injury, clip migration, and inadequate sampling. Informed written consent was given. The usual time out protocol was performed immediately prior to the procedure. Using sterile technique and 1% Lidocaine as local anesthetic, under stereotactic guidance, a 9 gauge vacuum assisted device was used to perform core needle biopsy of distortion in the central inferior right breast using a medial approach. Lesion quadrant: Lower inner quadrant At the conclusion of the procedure, a coil shaped tissue marker clip was deployed into the biopsy cavity. Follow-up 2-view mammogram was performed and dictated separately. IMPRESSION: Stereotactic-guided biopsy of distortion in the central inferior right breast. No apparent complications. Electronically Signed: By: Emmaline Kluver M.D. On: 10/15/2023 09:14   MM CLIP PLACEMENT LEFT  Result Date: 10/15/2023 CLINICAL DATA:  Post procedure mammogram for clip placement EXAM: 3D DIAGNOSTIC BILATERAL MAMMOGRAM POST STEREOTACTIC BIOPSY COMPARISON:  Previous exam(s). FINDINGS: 3D Mammographic images were obtained following stereotactic guided biopsy of distortion in the central inferior right breast. The coil biopsy marking clip is in expected position at the site of biopsy. 3D Mammographic images were obtained following stereotactic guided biopsy of a mass in the lower inner left breast. Suspect the biopsy marking clip is laterally displaced by approximately 1 cm, somewhat difficult to determine given post biopsy changes in the area. IMPRESSION: 1. Suspect mild lateral displacement of the ribbon biopsy marking clip in the left breast, somewhat difficult to determine given post biopsy  changes in the area. 2. Appropriate positioning of the coil biopsy marking clip at  the site of biopsy in the central inferior right breast. Final Assessment: Post Procedure Mammograms for Marker Placement Electronically Signed   By: Emmaline Kluver M.D.   On: 10/15/2023 09:12   MM CLIP PLACEMENT RIGHT  Result Date: 10/15/2023 CLINICAL DATA:  Post procedure mammogram for clip placement EXAM: 3D DIAGNOSTIC BILATERAL MAMMOGRAM POST STEREOTACTIC BIOPSY COMPARISON:  Previous exam(s). FINDINGS: 3D Mammographic images were obtained following stereotactic guided biopsy of distortion in the central inferior right breast. The coil biopsy marking clip is in expected position at the site of biopsy. 3D Mammographic images were obtained following stereotactic guided biopsy of a mass in the lower inner left breast. Suspect the biopsy marking clip is laterally displaced by approximately 1 cm, somewhat difficult to determine given post biopsy changes in the area. IMPRESSION: 1. Suspect mild lateral displacement of the ribbon biopsy marking clip in the left breast, somewhat difficult to determine given post biopsy changes in the area. 2. Appropriate positioning of the coil biopsy marking clip at the site of biopsy in the central inferior right breast. Final Assessment: Post Procedure Mammograms for Marker Placement Electronically Signed   By: Emmaline Kluver M.D.   On: 10/15/2023 09:12   MM 3D DIAGNOSTIC MAMMOGRAM BILATERAL BREAST  Result Date: 10/13/2023 CLINICAL DATA:  Bilateral breast abnormalities seen on most recent screening mammography. EXAM: DIGITAL DIAGNOSTIC BILATERAL MAMMOGRAM WITH TOMOSYNTHESIS AND CAD; ULTRASOUND RIGHT BREAST LIMITED; ULTRASOUND LEFT BREAST LIMITED TECHNIQUE: Bilateral digital diagnostic mammography and breast tomosynthesis was performed. The images were evaluated with computer-aided detection. ; Targeted ultrasound examination of the right breast was performed; Targeted ultrasound  examination of the left breast was performed. COMPARISON:  Previous exam(s). ACR Breast Density Category b: There are scattered areas of fibroglandular density. FINDINGS: Additional mammographic views of the right breast demonstrate persistent architectural distortion with associated ill-defined spiculated mass in the right breast 6 o'clock, middle to posterior depth, measuring 9 mm in greatest dimension mammographically. Additional mammographic views of the left breast demonstrate persistent circumscribed 6 mm mass in the lower slightly inner left breast, posterior depth. A second circumscribed 5 mm mass is seen in the left breast slightly lower outer quadrant, middle depth. Targeted right breast ultrasound is performed and demonstrates 6 o'clock 4 cm from the nipple hypoechoic lobulated mass measuring 0.9 x 0.8 x 1.1 cm. This finding may correspond to the mammographically seen mass, however the abnormality is better characterized mammographically. Additionally in the right breast 7 o'clock 3 cm from nipple there is a probably benign cyst measuring 0.8 x 0.2 x 0.2 cm. In the right breast 9 o'clock 8 cm from the nipple there is a benign cyst measuring 0.7 x 0.7 x 0.3 cm, and in the right breast 9 o'clock 7 cm from nipple there is a second benign cyst measuring 0.5 x 0.4 x 0.2 cm. There is no evidence of right axillary lymphadenopathy. Targeted left breast ultrasound is performed demonstrating 3 o'clock 5 cm from the nipple lobulated benign cyst measuring 0.7 x 0.5 x 0.6 cm. In the left breast 6 o'clock 4 cm from the nipple there is a cluster of cysts measuring 0.6 x 0.4 x 0.7 cm. It is not certain whether one of these findings corresponds to the mammographically seen newly developed mass. There is no evidence of left axillary lymphadenopathy. IMPRESSION: 1. Right breast 6 o'clock mammographically seen suspicious mass with associated architectural distortion, for which stereotactic core needle biopsy is recommended.  2. Left breast lower slightly inner quadrant mammographically seen circumscribed mass,  with uncertain sonographic correlation. Recommend stereotactic core needle biopsy. 3. Otherwise, bilateral benign-appearing cysts. RECOMMENDATION: Stereotactic core needle biopsy of the right and left breast. I have discussed the findings and recommendations with the patient. If applicable, a reminder letter will be sent to the patient regarding the next appointment. BI-RADS CATEGORY  4: Suspicious. Electronically Signed   By: Ted Mcalpine M.D.   On: 10/13/2023 15:08   [place] LIMITED ULTRASOUND INCLUDING AXILLA LEFT BREAST   Result Date: 10/13/2023 CLINICAL DATA:  Bilateral breast abnormalities seen on most recent screening mammography. EXAM: DIGITAL DIAGNOSTIC BILATERAL MAMMOGRAM WITH TOMOSYNTHESIS AND CAD; ULTRASOUND RIGHT BREAST LIMITED; ULTRASOUND LEFT BREAST LIMITED TECHNIQUE: Bilateral digital diagnostic mammography and breast tomosynthesis was performed. The images were evaluated with computer-aided detection. ; Targeted ultrasound examination of the right breast was performed; Targeted ultrasound examination of the left breast was performed. COMPARISON:  Previous exam(s). ACR Breast Density Category b: There are scattered areas of fibroglandular density. FINDINGS: Additional mammographic views of the right breast demonstrate persistent architectural distortion with associated ill-defined spiculated mass in the right breast 6 o'clock, middle to posterior depth, measuring 9 mm in greatest dimension mammographically. Additional mammographic views of the left breast demonstrate persistent circumscribed 6 mm mass in the lower slightly inner left breast, posterior depth. A second circumscribed 5 mm mass is seen in the left breast slightly lower outer quadrant, middle depth. Targeted right breast ultrasound is performed and demonstrates 6 o'clock 4 cm from the nipple hypoechoic lobulated mass measuring 0.9 x 0.8 x 1.1 cm.  This finding may correspond to the mammographically seen mass, however the abnormality is better characterized mammographically. Additionally in the right breast 7 o'clock 3 cm from nipple there is a probably benign cyst measuring 0.8 x 0.2 x 0.2 cm. In the right breast 9 o'clock 8 cm from the nipple there is a benign cyst measuring 0.7 x 0.7 x 0.3 cm, and in the right breast 9 o'clock 7 cm from nipple there is a second benign cyst measuring 0.5 x 0.4 x 0.2 cm. There is no evidence of right axillary lymphadenopathy. Targeted left breast ultrasound is performed demonstrating 3 o'clock 5 cm from the nipple lobulated benign cyst measuring 0.7 x 0.5 x 0.6 cm. In the left breast 6 o'clock 4 cm from the nipple there is a cluster of cysts measuring 0.6 x 0.4 x 0.7 cm. It is not certain whether one of these findings corresponds to the mammographically seen newly developed mass. There is no evidence of left axillary lymphadenopathy. IMPRESSION: 1. Right breast 6 o'clock mammographically seen suspicious mass with associated architectural distortion, for which stereotactic core needle biopsy is recommended. 2. Left breast lower slightly inner quadrant mammographically seen circumscribed mass, with uncertain sonographic correlation. Recommend stereotactic core needle biopsy. 3. Otherwise, bilateral benign-appearing cysts. RECOMMENDATION: Stereotactic core needle biopsy of the right and left breast. I have discussed the findings and recommendations with the patient. If applicable, a reminder letter will be sent to the patient regarding the next appointment. BI-RADS CATEGORY  4: Suspicious. Electronically Signed   By: Ted Mcalpine M.D.   On: 10/13/2023 15:08   [place] LIMITED ULTRASOUND INCLUDING AXILLA RIGHT BREAST  Result Date: 10/13/2023 CLINICAL DATA:  Bilateral breast abnormalities seen on most recent screening mammography. EXAM: DIGITAL DIAGNOSTIC BILATERAL MAMMOGRAM WITH TOMOSYNTHESIS AND CAD; ULTRASOUND RIGHT BREAST  LIMITED; ULTRASOUND LEFT BREAST LIMITED TECHNIQUE: Bilateral digital diagnostic mammography and breast tomosynthesis was performed. The images were evaluated with computer-aided detection. ; Targeted ultrasound examination of the  right breast was performed; Targeted ultrasound examination of the left breast was performed. COMPARISON:  Previous exam(s). ACR Breast Density Category b: There are scattered areas of fibroglandular density. FINDINGS: Additional mammographic views of the right breast demonstrate persistent architectural distortion with associated ill-defined spiculated mass in the right breast 6 o'clock, middle to posterior depth, measuring 9 mm in greatest dimension mammographically. Additional mammographic views of the left breast demonstrate persistent circumscribed 6 mm mass in the lower slightly inner left breast, posterior depth. A second circumscribed 5 mm mass is seen in the left breast slightly lower outer quadrant, middle depth. Targeted right breast ultrasound is performed and demonstrates 6 o'clock 4 cm from the nipple hypoechoic lobulated mass measuring 0.9 x 0.8 x 1.1 cm. This finding may correspond to the mammographically seen mass, however the abnormality is better characterized mammographically. Additionally in the right breast 7 o'clock 3 cm from nipple there is a probably benign cyst measuring 0.8 x 0.2 x 0.2 cm. In the right breast 9 o'clock 8 cm from the nipple there is a benign cyst measuring 0.7 x 0.7 x 0.3 cm, and in the right breast 9 o'clock 7 cm from nipple there is a second benign cyst measuring 0.5 x 0.4 x 0.2 cm. There is no evidence of right axillary lymphadenopathy. Targeted left breast ultrasound is performed demonstrating 3 o'clock 5 cm from the nipple lobulated benign cyst measuring 0.7 x 0.5 x 0.6 cm. In the left breast 6 o'clock 4 cm from the nipple there is a cluster of cysts measuring 0.6 x 0.4 x 0.7 cm. It is not certain whether one of these findings corresponds to  the mammographically seen newly developed mass. There is no evidence of left axillary lymphadenopathy. IMPRESSION: 1. Right breast 6 o'clock mammographically seen suspicious mass with associated architectural distortion, for which stereotactic core needle biopsy is recommended. 2. Left breast lower slightly inner quadrant mammographically seen circumscribed mass, with uncertain sonographic correlation. Recommend stereotactic core needle biopsy. 3. Otherwise, bilateral benign-appearing cysts. RECOMMENDATION: Stereotactic core needle biopsy of the right and left breast. I have discussed the findings and recommendations with the patient. If applicable, a reminder letter will be sent to the patient regarding the next appointment. BI-RADS CATEGORY  4: Suspicious. Electronically Signed   By: Ted Mcalpine M.D.   On: 10/13/2023 15:08      IMPRESSION: Stage IA (cT1c, cN0, cM0) Right Breast LIQ, Invasive Lobular Carcinoma, ER+ / PR+ / Her2-, Grade 1  Patient will be a good candidate for breast conservation with radiotherapy to the right breast. We discussed the general course of radiation, potential side effects, and toxicities with radiation and the patient is interested in this approach.   She lives in [place] and may wish to have her radiation treatment at the new [facility].    PLAN:  Genetics  Right breast lumpectomy with SLN evaluation  Oncotype Dx  Adjuvant radiation therapy  Aromatase inhibitor    ------------------------------------------------  Billie Lade, PhD, MD  This document serves as a record of services personally performed by Antony Blackbird, MD. It was created on his behalf by Neena Rhymes, a trained medical scribe. The creation of this record is based on the scribe's personal observations and the provider's statements to them. This document has been checked and approved by the attending provider.

## 2023-10-21 NOTE — Progress Notes (Signed)
[facility] CONSULT NOTE  Patient Care Team: Marianne Sofia, [name] as PCP - General (Physician Assistant) Thomasene Ripple, DO as PCP - Cardiology (Cardiology) Pershing Proud, RN as Oncology Nurse Navigator Donnelly Angelica, RN as Oncology Nurse Navigator Serena Croissant, MD as Consulting Physician (Hematology and Oncology) Abigail Miyamoto, MD as Consulting Physician (General Surgery) Antony Blackbird, MD as Consulting Physician (Radiation Oncology)  CHIEF COMPLAINTS/PURPOSE OF CONSULTATION:  Newly diagnosed breast cancer  HISTORY OF PRESENTING ILLNESS:  Ms. Lori Wise is a 62 year old with a screening mammogram detected right breast distortion at 6 o'clock position measuring 1.1 cm.  Stereotactic biopsy revealed grade 1 invasive lobular cancer that is ER/PR positive HER2 negative with a Ki-67 of 3%.  Axilla was negative for any lymph nodes.  Left breast biopsy was benign.  She was presented this morning to the multidisciplinary tumor board and she is here today accompanied by her husband and daughters to discuss her treatment plan.   I reviewed her records extensively and collaborated the history with the patient.  SUMMARY OF ONCOLOGIC HISTORY: Oncology History  Malignant neoplasm of lower-inner quadrant of right breast of female, estrogen receptor positive (HCC)  10/15/2023 Initial Diagnosis   Screening mammogram detected right breast distortion at 6:00 measuring 1.1 cm, stereotactic biopsy: Grade 1 ILC ER 95%, PR 95%, Ki-67 3%, HER2 negative, axilla negative (left breast biopsy: Benign)   10/21/2023 Cancer Staging   Staging form: Breast, AJCC 8th Edition - Clinical: Stage IA (cT1c, cN0, cM0, G1, ER+, PR+, HER2-) - Signed by Serena Croissant, MD on 10/21/2023 Stage prefix: Initial diagnosis Histologic grading system: 3 grade system      MEDICAL HISTORY:  Past Medical History:  Diagnosis Date   Acute laryngopharyngitis 04/19/2020   Anxiety 04/19/2020   Atypical nevus 04/19/2020    Bronchitis 04/30/2020   Chronic obstructive pulmonary disease (HCC) 10/22/2020   Chronic obstructive pulmonary disease with (acute) lower respiratory infection (HCC)    Encounter for immunization 04/19/2020   Essential (primary) hypertension    Gastroesophageal reflux disease 10/22/2020   Hormone replacement therapy (HRT) 10/22/2020   Migraine without aura, not intractable, without status migrainosus    Mixed hyperlipidemia    Other chest pain 03/21/2021   Other specified hypothyroidism 04/19/2020   Primary hypertension 03/21/2021   Right leg pain 07/23/2020   Sunburn of second degree     SURGICAL HISTORY: Past Surgical History:  Procedure Laterality Date   ABDOMINAL HYSTERECTOMY     BREAST BIOPSY Left 10/15/2023   MM LT BREAST BX W LOC DEV 1ST LESION IMAGE BX SPEC STEREO GUIDE 10/15/2023 [facility]   BREAST BIOPSY Right 10/15/2023   MM RT BREAST BX W LOC DEV 1ST LESION IMAGE BX SPEC STEREO GUIDE 10/15/2023 [facility]   CARDIAC CATHETERIZATION  2000   normal   CHOLECYSTECTOMY     NASAL HEMORRHAGE CONTROL     OTHER SURGICAL HISTORY     Ear surgeries x5    SOCIAL HISTORY: Social History   Socioeconomic History   Marital status: Legally Separated    Spouse name: Not on file   Number of children: 2   Years of education: Not on file   Highest education level: Not on file  Occupational History   Occupation: [employer] service  Tobacco Use   Smoking status: Former    Current packs/day: 0.00    Types: Cigarettes    Quit date: 05/28/2023    Years since quitting: 0.4   Smokeless tobacco: Never  Vaping Use   Vaping status: Never Used  Substance and Sexual Activity   Alcohol use: Not Currently    Comment: Drinks approximately two times per week.   Drug use: Never   Sexual activity: Not on file  Other Topics Concern   Not on file  Social History Narrative   Not on file   Social Determinants of Health   Financial Resource Strain: Low Risk  (08/04/2023)    Overall Financial Resource Strain (CARDIA)    Difficulty of Paying Living Expenses: Not hard at all  Food Insecurity: No Food Insecurity (08/04/2023)   Hunger Vital Sign    Worried About Running Out of Food in the Last Year: Never true    Ran Out of Food in the Last Year: Never true  Transportation Needs: No Transportation Needs (08/04/2023)   PRAPARE - Administrator, Civil Service (Medical): No    Lack of Transportation (Non-Medical): No  Physical Activity: Inactive (08/04/2023)   Exercise Vital Sign    Days of Exercise per Week: 0 days    Minutes of Exercise per Session: 0 min  Stress: No Stress Concern Present (08/04/2023)   [employer] of Occupational Health - Occupational Stress Questionnaire    Feeling of Stress : Not at all  Social Connections: Unknown (08/04/2023)   Social Connection and Isolation Panel [NHANES]    Frequency of Communication with Friends and Family: More than three times a week    Frequency of Social Gatherings with Friends and Family: Three times a week    Attends Religious Services: Patient declined    Active Member of Clubs or Organizations: No    Attends Banker Meetings: Never    Marital Status: Patient declined  Intimate Partner Violence: Not At Risk (08/04/2023)   Humiliation, Afraid, Rape, and Kick questionnaire    Fear of Current or Ex-Partner: No    Emotionally Abused: No    Physically Abused: No    Sexually Abused: No    FAMILY HISTORY: Family History  Problem Relation Age of Onset   Suicidality Mother    Lung cancer Father    Cancer Sister        Cell-1   Brain cancer Brother    Lung cancer Brother    Breast cancer Paternal Grandmother     ALLERGIES:  is allergic to codeine and macrobid [nitrofurantoin].  MEDICATIONS:  Current Outpatient Medications  Medication Sig Dispense Refill   albuterol (VENTOLIN HFA) 108 (90 Base) MCG/ACT inhaler Inhale 2 puffs into the lungs every 6 (six) hours as needed for  wheezing or shortness of breath. 8 g 2   ALPRAZolam (XANAX) 0.5 MG tablet TAKE ONE TABLET BY MOUTH 3 TIMES DAILY 90 tablet 0   Ascorbic Acid (VITAMIN C PO) Take 1 tablet by mouth daily. Unknown strength     atenolol (TENORMIN) 25 MG tablet TAKE ONE TABLET BY MOUTH TWICE DAILY 60 tablet 5   cetirizine (ZYRTEC) 10 MG tablet TAKE ONE TABLET BY MOUTH DAILY 30 tablet 11   Cyanocobalamin (VITAMIN B-12 PO) Take 1 tablet by mouth daily. Unknown strenght     Emollient (ROC RETINOL CORREXION EX) Apply topically.     estradiol (ESTRACE) 0.5 MG tablet Take 1 tablet (0.5 mg total) by mouth daily. 90 tablet 1   ezetimibe (ZETIA) 10 MG tablet TAKE ONE TABLET BY MOUTH EVERY DAY 30 tablet 5   fluconazole (DIFLUCAN) 150 MG tablet Take 1 tablet (150 mg total) by mouth daily. 1 tablet  1   fluticasone (FLONASE) 50 MCG/ACT nasal spray Place 2 sprays into both nostrils daily. 16 g 6   Fluticasone-Umeclidin-Vilant (TRELEGY ELLIPTA) 200-62.5-25 MCG/ACT AEPB Inhale 1 Inhalation into the lungs daily. 1 each 5   ibuprofen (ADVIL) 800 MG tablet Take 1 tablet (800 mg total) by mouth every 8 (eight) hours as needed for mild pain or moderate pain. 90 tablet 1   ipratropium-albuterol (DUONEB) 0.5-2.5 (3) MG/3ML SOLN Take 3 mLs by nebulization every 4 (four) hours as needed. 360 mL 3   levothyroxine (SYNTHROID) 25 MCG tablet TAKE ONE TABLET BY MOUTH EVERY DAY 90 tablet 1   Magnesium 200 MG TABS Take 1 tablet by mouth daily.     Omega-3 1000 MG CAPS Take 1 capsule by mouth daily.     omeprazole (PRILOSEC) 20 MG capsule Take 1 capsule (20 mg total) by mouth 2 (two) times daily before a meal. 180 capsule 1   rosuvastatin (CRESTOR) 5 MG tablet Take 1 tablet (5 mg total) by mouth daily. 90 tablet 0   saline (AYR) GEL Place 1 Application into the nose at bedtime. 14.1 g 0   tretinoin (RETIN-A) 0.05 % cream Apply topically at bedtime.     VITAMIN E PO Take 1 tablet by mouth daily at 6 (six) AM. Unknown strength     No current  facility-administered medications for this visit.    REVIEW OF SYSTEMS:   Constitutional: Denies fevers, chills or abnormal night sweats Breast:  Denies any palpable lumps or discharge All other systems were reviewed with the patient and are negative.  PHYSICAL EXAMINATION: ECOG PERFORMANCE STATUS: 0 - Asymptomatic  Vitals:   10/21/23 1302  BP: (!) 135/51  Pulse: 60  Resp: 18  Temp: (!) 97.2 F (36.2 C)  SpO2: 98%   Filed Weights   10/21/23 1302  Weight: 144 lb 9.6 oz (65.6 kg)    GENERAL:alert, no distress and comfortable    LABORATORY DATA:  I have reviewed the data as listed Lab Results  Component Value Date   WBC 9.5 10/21/2023   HGB 12.4 10/21/2023   HCT 37.0 10/21/2023   MCV 90.9 10/21/2023   PLT 285 10/21/2023   Lab Results  Component Value Date   NA 143 10/21/2023   K 3.3 (L) 10/21/2023   CL 110 10/21/2023   CO2 28 10/21/2023    RADIOGRAPHIC STUDIES: I have personally reviewed the radiological reports and agreed with the findings in the report.  ASSESSMENT AND PLAN:  Malignant neoplasm of lower-inner quadrant of right breast of female, estrogen receptor positive (HCC) 10/15/2023:Screening mammogram detected right breast distortion at 6:00 measuring 1.1 cm, stereotactic biopsy: Grade 1 ILC ER 95%, PR 95%, Ki-67 3%, HER2 negative, axilla negative (left breast biopsy: Benign)  Pathology and radiology counseling:Discussed with the patient, the details of pathology including the type of breast cancer,the clinical staging, the significance of ER, PR and HER-2/neu receptors and the implications for treatment. After reviewing the pathology in detail, we proceeded to discuss the different treatment options between surgery, radiation, chemotherapy, antiestrogen therapies.  Recommendations: 1. Breast conserving surgery followed by 2. Oncotype DX testing to determine if chemotherapy would be of any benefit followed by 3. Adjuvant radiation therapy followed by 4.  Adjuvant antiestrogen therapy  Oncotype counseling: I discussed Oncotype DX test. I explained to the patient that this is a 21 gene panel to evaluate patient tumors DNA to calculate recurrence score. This would help determine whether patient has high risk or low risk  breast cancer. She understands that if her tumor was found to be high risk, she would benefit from systemic chemotherapy. If low risk, no need of chemotherapy.  Return to clinic after surgery to discuss final pathology report and then determine if Oncotype DX testing will need to be sent.    All questions were answered. The patient knows to call the clinic with any problems, questions or concerns.    Tamsen Meek, MD 10/21/23

## 2023-10-21 NOTE — Assessment & Plan Note (Signed)
10/15/2023:Screening mammogram detected right breast distortion at 6:00 measuring 1.1 cm, stereotactic biopsy: Grade 1 ILC ER 95%, PR 95%, Ki-67 3%, HER2 negative, axilla negative (left breast biopsy: Benign)  Pathology and radiology counseling:Discussed with the patient, the details of pathology including the type of breast cancer,the clinical staging, the significance of ER, PR and HER-2/neu receptors and the implications for treatment. After reviewing the pathology in detail, we proceeded to discuss the different treatment options between surgery, radiation, chemotherapy, antiestrogen therapies.  Recommendations: 1. Breast conserving surgery followed by 2. Oncotype DX testing to determine if chemotherapy would be of any benefit followed by 3. Adjuvant radiation therapy followed by 4. Adjuvant antiestrogen therapy  Oncotype counseling: I discussed Oncotype DX test. I explained to the patient that this is a 21 gene panel to evaluate patient tumors DNA to calculate recurrence score. This would help determine whether patient has high risk or low risk breast cancer. She understands that if her tumor was found to be high risk, she would benefit from systemic chemotherapy. If low risk, no need of chemotherapy.  Return to clinic after surgery to discuss final pathology report and then determine if Oncotype DX testing will need to be sent.

## 2023-10-21 NOTE — Research (Signed)
Trial: [employer] 2021-05 - Specimen Collection Study to Evaluate Biomarkers in Subjects with Cancer    Patient Lori Wise was identified by Dr Pamelia Hoit as a potential candidate for the above listed study.  This Clinical Research Nurse met with Lori Wise, Lori Wise, on 10/21/23 in a manner and location that ensures patient privacy to discuss participation in the above listed research study.  Patient is Accompanied by her husband and daughters .  A copy of the informed consent document with embedded HIPAA language was provided to the patient.  Patient reads, speaks, and understands [place].    Patient was provided with the business card of this Nurse and encouraged to contact the research team with any questions.  Patient was provided the option of taking informed consent documents home to review and was encouraged to review at their convenience with their support network, including other care providers. Patient is comfortable with making a decision regarding study participation today.  As outlined in the informed consent form, this Nurse and Lori Wise discussed the purpose of the research study, the investigational nature of the study, study procedures and requirements for study participation, potential risks and benefits of study participation, as well as alternatives to participation. This study is not blinded. The patient understands participation is voluntary and they may withdraw from study participation at any time.  This study does not involve randomization.  This study does not involve an investigational drug or device. This study does not involve a placebo. Patient understands enrollment is pending full eligibility review.   Confidentiality and how the patient's information will be used as part of study participation were discussed.  Patient was informed there is reimbursement provided for their time and effort spent on trial participation.  All questions were answered to patient's  satisfaction.  The informed consent with embedded HIPAA language was reviewed page by page.  The patient's mental and emotional status is appropriate to provide informed consent, and the patient verbalizes an understanding of study participation.  Patient has agreed to participate in the above listed research study and has voluntarily signed the informed consent version 21 Dec 2020, revised 06 Jan 2022 with embedded HIPAA language on 10/21/23 at 3:15PM.  The patient was provided with a copy of the signed informed consent form with embedded HIPAA language for their reference.  No study specific procedures were obtained prior to the signing of the informed consent document.  Approximately 20 minutes were spent with the patient reviewing the informed consent documents.  Patient was not requested to complete a Release of Information form.  Eligibility: Eligibility criteria reviewed with patient. This Nurse has reviewed this patient's inclusion and exclusion criteria and confirmed patient is eligible for study participation. Eligibility confirmed by treating investigator, who also agrees that patient should proceed with enrollment. Patient will continue with enrollment.  Data Collection: Medical History:  High Blood Pressure  Yes Coronary Artery Disease No Lupus    No Rheumatoid Arthritis  No Diabetes   No      Lynch Syndrome  No  Is the patient currently taking a magnesium supplement?   No  Does the patient have a personal history of cancer (greater than 5 years ago)?  No  Does the patient have a family history of cancer in 1st or 2nd degree relatives? Yes If yes, Relationship(s) and Cancer type(s)?  Father- Lung, Brother- Lung, Brother- Brain, Sister- Lung, Uncle- Colon, Aunt- Breast.   Does the patient have history of alcohol consumption? Yes  If yes, current or former? Current Number of years? 10 Drinks per week? 1 drink every 3 months.   Does the patient have history of cigarette, cigar,  pipe, or chewing tobacco use?  Yes  If yes, current for former? former If yes, type (Cigarette, cigar, pipe, and/or chewing tobacco)? Cigarettes   If former, year stopped? 2024, June Number of years? 53 Packs/number/containers per day? 1   Blood Collection: Research blood obtained by [facility] venipuncture. Patient tolerated well without any adverse events.  Gift Card: $50 gift card given to patient for her participation in this study.     Patient was thanked for her participation in this study and encouraged to call research nurse if any questions.   Domenica Reamer, BSN, RN, [employer] Research Nurse II [phone] 10/21/2023 4:42 PM

## 2023-10-21 NOTE — Therapy (Signed)
OUTPATIENT PHYSICAL THERAPY BREAST CANCER BASELINE EVALUATION   Patient Name: Lori Wise MRN: [id number] DOB:[birth year], 62 y.o., female Today's Date: 10/21/2023  END OF SESSION:  PT End of Session - 10/21/23 1439     Visit Number 1    Number of Visits 2    Date for PT Re-Evaluation 12/16/23    PT Start Time 1345    PT Stop Time 1352   Also saw pt from 1404-1430 for a total of 33 min   PT Time Calculation (min) 7 min    Activity Tolerance Patient tolerated treatment well    Behavior During Therapy [facility] for tasks assessed/performed             Past Medical History:  Diagnosis Date   Acute laryngopharyngitis 04/19/2020   Anxiety 04/19/2020   Atypical nevus 04/19/2020   Bronchitis 04/30/2020   Chronic obstructive pulmonary disease (HCC) 10/22/2020   Chronic obstructive pulmonary disease with (acute) lower respiratory infection (HCC)    Encounter for immunization 04/19/2020   Essential (primary) hypertension    Gastroesophageal reflux disease 10/22/2020   Hormone replacement therapy (HRT) 10/22/2020   Migraine without aura, not intractable, without status migrainosus    Mixed hyperlipidemia    Other chest pain 03/21/2021   Other specified hypothyroidism 04/19/2020   Primary hypertension 03/21/2021   Right leg pain 07/23/2020   Sunburn of second degree    Past Surgical History:  Procedure Laterality Date   ABDOMINAL HYSTERECTOMY     BREAST BIOPSY Left 10/15/2023   MM LT BREAST BX W LOC DEV 1ST LESION IMAGE BX SPEC STEREO GUIDE 10/15/2023 [facility]   BREAST BIOPSY Right 10/15/2023   MM RT BREAST BX W LOC DEV 1ST LESION IMAGE BX SPEC STEREO GUIDE 10/15/2023 [facility]   CARDIAC CATHETERIZATION  2000   normal   CHOLECYSTECTOMY     NASAL HEMORRHAGE CONTROL     OTHER SURGICAL HISTORY     Ear surgeries x5   Patient Active Problem List   Diagnosis Date Noted   Malignant neoplasm of lower-inner quadrant of right breast of female, estrogen receptor positive (HCC)  10/19/2023   Epistaxis 05/06/2023   Acute cystitis with hematuria 04/16/2023   Neck pain 04/16/2023   Tobacco abuse 01/28/2023   Allergic rhinitis due to allergen 04/15/2022   Need for pneumococcal 20-valent conjugate vaccination 04/15/2022   Vitamin D deficiency 04/15/2022   Smoking 08/30/2021   Chronic obstructive pulmonary disease with (acute) lower respiratory infection (HCC)    Essential (primary) hypertension    Migraine without aura, not intractable, without status migrainosus    Sunburn of second degree    Atypical chest pain 03/21/2021   Primary hypertension 03/21/2021   Chronic obstructive pulmonary disease (HCC) 10/22/2020   Hormone replacement therapy (HRT) 10/22/2020   GERD without esophagitis 10/22/2020   Right leg pain 07/23/2020   Bronchitis 04/30/2020   Mixed hyperlipidemia 04/19/2020   Other specified hypothyroidism 04/19/2020   Anxiety 04/19/2020   Acute laryngopharyngitis 04/19/2020   Encounter for immunization 04/19/2020   Atypical nevus 04/19/2020   REFERRING PROVIDER: Dr. Abigail Miyamoto  REFERRING DIAG: Right breast cancer  THERAPY DIAG:  Malignant neoplasm of lower-inner quadrant of right breast of female, estrogen receptor positive (HCC)  Abnormal posture  Rationale for Evaluation and Treatment: Rehabilitation  ONSET DATE: 09/16/2023  SUBJECTIVE:  SUBJECTIVE STATEMENT: Patient reports she is here today to be seen by her medical team for her newly diagnosed right breast cancer.   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 It measures 1.1 cm and is located in the lower inner quadrant. It is ER/PR positive and HER2 negative with a Ki67 of 3%.   PATIENT GOALS:   reduce lymphedema risk and learn  post op HEP.   PAIN:  Are you having pain? No  PRECAUTIONS: Active CA   RED FLAGS: None   HAND DOMINANCE: right  WEIGHT BEARING RESTRICTIONS: No  FALLS:  Has patient fallen in last 6 months? No  LIVING ENVIRONMENT: Patient lives with: her adult daughter and husband Lives in: House/apartment Has following equipment at home: None  OCCUPATION: Retired  LEISURE: She does some squats, dips, and gardens  PRIOR LEVEL OF FUNCTION: Independent   OBJECTIVE: Note: Objective measures were completed at Evaluation unless otherwise noted.  COGNITION: Overall cognitive status: Within functional limits for tasks assessed    POSTURE:  Forward head and rounded shoulders posture  UPPER EXTREMITY AROM/PROM:  A/PROM RIGHT   eval   Shoulder extension 42  Shoulder flexion 145  Shoulder abduction 151  Shoulder internal rotation 64  Shoulder external rotation 89    (Blank rows = not tested)  A/PROM LEFT   eval  Shoulder extension 39  Shoulder flexion 142  Shoulder abduction 164  Shoulder internal rotation 71  Shoulder external rotation 89    (Blank rows = not tested)  CERVICAL AROM: All within normal limits  UPPER EXTREMITY STRENGTH: WNL  LYMPHEDEMA ASSESSMENTS (in cm):   LANDMARK RIGHT   eval  10 cm proximal to olecranon process 28  Olecranon process 23.2  10 cm proximal to ulnar styloid process 21.5  Just proximal to ulnar styloid process 14.7  Across hand at thumb web space 17.5  At base of 2nd digit 5.6  (Blank rows = not tested)  LANDMARK LEFT   eval  10 cm proximal to olecranon process 27  Olecranon process 22.9  10 cm proximal to ulnar styloid process 19.7  Just proximal to ulnar styloid process 14.7  Across hand at thumb web space 18  At base of 2nd digit 5.7  (Blank rows = not tested)  L-DEX LYMPHEDEMA SCREENING:  The patient was assessed using the L-Dex machine today to produce a lymphedema index baseline score. The patient will be reassessed on  a regular basis (typically every 3 months) to obtain new L-Dex scores. If the score is > 6.5 points away from his/her baseline score indicating onset of subclinical lymphedema, it will be recommended to wear a compression garment for 4 weeks, 12 hours per day and then be reassessed. If the score continues to be > 6.5 points from baseline at reassessment, we will initiate lymphedema treatment. Assessing in this manner has a 95% rate of preventing clinically significant lymphedema.   L-DEX FLOWSHEETS - 10/21/23 1400       L-DEX LYMPHEDEMA SCREENING   Measurement Type Unilateral    L-DEX MEASUREMENT EXTREMITY Upper Extremity    POSITION  Standing    DOMINANT SIDE Right    At Risk Side Right    BASELINE SCORE (UNILATERAL) 0.6              PATIENT EDUCATION:  Education details: Lymphedema risk reduction and post op shoulder/posture HEP Person educated: Patient Education method: Explanation, Demonstration, Handout Education comprehension: Patient verbalized  understanding and returned demonstration  HOME EXERCISE PROGRAM: Patient was instructed today in a home exercise program today for post op shoulder range of motion. These included active assist shoulder flexion in sitting, scapular retraction, wall walking with shoulder abduction, and hands behind head external rotation.  She was encouraged to do these twice a day, holding 3 seconds and repeating 5 times when permitted by her physician.   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 It measures 1.1 cm and is located in the lower inner quadrant. It is ER/PR positive and HER2 negative with a Ki67 of 3%.Her multidisciplinary medical team met prior to her assessments to determine a recommended treatment plan. She is planning to have a right lumpectomy and sentinel node biopsy followed by Oncotype testing, radiation, and anti-estrogen therapy. She will  benefit from a post op PT reassessment to determine needs and from L-Dex screens every 3 months for 2 years to detect subclinical lymphedema.  Pt will benefit from skilled therapeutic intervention to improve on the following deficits: Decreased knowledge of precautions, impaired UE functional use, pain, decreased ROM, postural dysfunction.   PT treatment/interventions: ADL/self-care home management, pt/family education, therapeutic exercise  REHAB POTENTIAL: Excellent  CLINICAL DECISION MAKING: Stable/uncomplicated  EVALUATION COMPLEXITY: Low   GOALS: Goals reviewed with patient? YES  LONG TERM GOALS: (STG=LTG)    Name Target Date Goal status  1 Pt will be able to verbalize understanding of pertinent lymphedema risk reduction practices relevant to her dx specifically related to skin care.  Baseline:  No knowledge 10/21/2023 Achieved at eval  2 Pt will be able to return demo and/or verbalize understanding of the post op HEP related to regaining shoulder ROM. Baseline:  No knowledge 10/21/2023 Achieved at eval  3 Pt will be able to verbalize understanding of the importance of attending the post op After Breast CA Class for further lymphedema risk reduction education and therapeutic exercise.  Baseline:  No knowledge 10/21/2023 Achieved at eval  4 Pt will demo she has regained full shoulder ROM and function post operatively compared to baselines.  Baseline: See objective measurements taken today. 12/16/2023     PLAN:  PT FREQUENCY/DURATION: EVAL and 1 follow up appointment.   PLAN FOR NEXT SESSION: will reassess 3-4 weeks post op to determine needs.   Patient will follow up at outpatient cancer rehab 3-4 weeks following surgery.  If the patient requires physical therapy at that time, a specific plan will be dictated and sent to the referring physician for approval. The patient was educated today on appropriate basic range of motion exercises to begin post operatively and the importance  of attending the After Breast Cancer class following surgery.  Patient was educated today on lymphedema risk reduction practices as it pertains to recommendations that will benefit the patient immediately following surgery.  She verbalized good understanding.    Physical Therapy Information for After Breast Cancer Surgery/Treatment:  Lymphedema is a swelling condition that you may be at risk for in your arm if you have lymph nodes removed from the armpit area.  After a sentinel node biopsy, the risk is approximately 5-9% and is higher after an axillary node dissection.  There is treatment available for this condition and it is not life-threatening.  Contact your physician or physical therapist with concerns. You may begin the 4 shoulder/posture exercises (see additional sheet) when permitted by your physician (typically a week after surgery).  If you have drains,  you may need to wait until those are removed before beginning range of motion exercises.  A general recommendation is to not lift your arms above shoulder height until drains are removed.  These exercises should be done to your tolerance and gently.  This is not a "no pain/no gain" type of recovery so listen to your body and stretch into the range of motion that you can tolerate, stopping if you have pain.  If you are having immediate reconstruction, ask your plastic surgeon about doing exercises as he or she may want you to wait. We encourage you to attend the free one time ABC (After Breast Cancer) class offered by [facility].  You will learn information related to lymphedema risk, prevention and treatment and additional exercises to regain mobility following surgery.  You can call [phone] for more information.  This is offered the 1st and 3rd Monday of each month.  You only attend the class one time. While undergoing any medical procedure or treatment, try to avoid blood pressure being taken or needle sticks from  occurring on the arm on the side of cancer.   This recommendation begins after surgery and continues for the rest of your life.  This may help reduce your risk of getting lymphedema (swelling in your arm). An excellent resource for those seeking information on lymphedema is the National Lymphedema Network's web site. It can be accessed at www.lymphnet.org If you notice swelling in your hand, arm or breast at any time following surgery (even if it is many years from now), please contact your doctor or physical therapist to discuss this.  Lymphedema can be treated at any time but it is easier for you if it is treated early on.  If you feel like your shoulder motion is not returning to normal in a reasonable amount of time, please contact your surgeon or physical therapist.  [facility] [phone]. [address], [place] [address]  ABC CLASS After Breast Cancer Class  After Breast Cancer Class is a specially designed exercise class to assist you in a safe recover after having breast cancer surgery.  In this class you will learn how to get back to full function whether your drains were just removed or if you had surgery a month ago.  This one-time class is held the 1st and 3rd Monday of every month from 11:00 a.m. until 12:00 noon virtually.  This class is FREE and space is limited. For more information or to register for the next available class, call [phone].  Class Goals  Understand specific stretches to improve the flexibility of you chest and shoulder. Learn ways to safely strengthen your upper body and improve your posture. Understand the warning signs of infection and why you may be at risk for an arm infection. Learn about Lymphedema and prevention.  ** You do not attend this class until after surgery.  Drains must be removed to participate  Patient was instructed today in a home exercise program today for post op shoulder range of  motion. These included active assist shoulder flexion in sitting, scapular retraction, wall walking with shoulder abduction, and hands behind head external rotation.  She was encouraged to do these twice a day, holding 3 seconds and repeating 5 times when permitted by her physician.  Bethann Punches, [facility] 10/21/23 3:31 PM

## 2023-10-22 ENCOUNTER — Encounter: Payer: Self-pay | Admitting: Genetic Counselor

## 2023-10-22 NOTE — Progress Notes (Signed)
REFERRING PROVIDER: Serena Croissant, MD  PRIMARY PROVIDER:  Marianne Sofia, [name]  PRIMARY REASON FOR VISIT:  1. Malignant neoplasm of lower-inner quadrant of right breast of female, estrogen receptor positive (HCC)   2. Family history of breast cancer   3. Family history of prostate cancer    HISTORY OF PRESENT ILLNESS:   Lori Wise, a 61 y.o. female, was seen for a [place] cancer genetics consultation during the breast multidisciplinary clinic at the request of Dr. Pamelia Hoit due to a personal and family history of cancer.  Lori Wise presents to clinic today to discuss the possibility of a hereditary predisposition to cancer, to discuss genetic testing, and to further clarify her future cancer risks, as well as potential cancer risks for family members.   In November 2024, at the age of 24, Lori Wise was diagnosed with invasive lobular carcinoma of the right breast (ER/PR positive, HER2 negative). The treatment plan includes a lumpectomy followed by oncotype, radiation, and anti-estrogen therapy.   CANCER HISTORY:  Oncology History  Malignant neoplasm of lower-inner quadrant of right breast of female, estrogen receptor positive (HCC)  10/15/2023 Initial Diagnosis   Screening mammogram detected right breast distortion at 6:00 measuring 1.1 cm, stereotactic biopsy: Grade 1 ILC ER 95%, PR 95%, Ki-67 3%, HER2 negative, axilla negative (left breast biopsy: Benign)   10/21/2023 Cancer Staging   Staging form: Breast, AJCC 8th Edition - Clinical: Stage IA (cT1c, cN0, cM0, G1, ER+, PR+, HER2-) - Signed by Serena Croissant, MD on 10/21/2023 Stage prefix: Initial diagnosis Histologic grading system: 3 grade system     Past Medical History:  Diagnosis Date   Acute laryngopharyngitis 04/19/2020   Anxiety 04/19/2020   Atypical nevus 04/19/2020   Bronchitis 04/30/2020   Chronic obstructive pulmonary disease (HCC) 10/22/2020   Chronic obstructive pulmonary disease with (acute) lower respiratory infection (HCC)     Encounter for immunization 04/19/2020   Essential (primary) hypertension    Gastroesophageal reflux disease 10/22/2020   Hormone replacement therapy (HRT) 10/22/2020   Migraine without aura, not intractable, without status migrainosus    Mixed hyperlipidemia    Other chest pain 03/21/2021   Other specified hypothyroidism 04/19/2020   Primary hypertension 03/21/2021   Right leg pain 07/23/2020   Sunburn of second degree     Past Surgical History:  Procedure Laterality Date   ABDOMINAL HYSTERECTOMY     BREAST BIOPSY Left 10/15/2023   MM LT BREAST BX W LOC DEV 1ST LESION IMAGE BX SPEC STEREO GUIDE 10/15/2023 [facility]   BREAST BIOPSY Right 10/15/2023   MM RT BREAST BX W LOC DEV 1ST LESION IMAGE BX SPEC STEREO GUIDE 10/15/2023 [facility]   CARDIAC CATHETERIZATION  2000   normal   CHOLECYSTECTOMY     NASAL HEMORRHAGE CONTROL     OTHER SURGICAL HISTORY     Ear surgeries x5    Social History   Socioeconomic History   Marital status: Legally Separated    Spouse name: Not on file   Number of children: 2   Years of education: Not on file   Highest education level: Not on file  Occupational History   Occupation: [employer] service  Tobacco Use   Smoking status: Former    Current packs/day: 0.00    Types: Cigarettes    Quit date: 05/28/2023    Years since quitting: 0.4   Smokeless tobacco: Never  Vaping Use   Vaping status: Never Used  Substance and Sexual Activity   Alcohol use: Not  Currently    Comment: Drinks approximately two times per week.   Drug use: Never   Sexual activity: Not on file  Other Topics Concern   Not on file  Social History Narrative   Not on file   Social Determinants of Health   Financial Resource Strain: Low Risk  (08/04/2023)   Overall Financial Resource Strain (CARDIA)    Difficulty of Paying Living Expenses: Not hard at all  Food Insecurity: No Food Insecurity (10/21/2023)   Hunger Vital Sign    Worried About Running Out of Food  in the Last Year: Never true    Ran Out of Food in the Last Year: Never true  Transportation Needs: No Transportation Needs (10/21/2023)   PRAPARE - Administrator, Civil Service (Medical): No    Lack of Transportation (Non-Medical): No  Physical Activity: Inactive (08/04/2023)   Exercise Vital Sign    Days of Exercise per Week: 0 days    Minutes of Exercise per Session: 0 min  Stress: No Stress Concern Present (08/04/2023)   [employer] of Occupational Health - Occupational Stress Questionnaire    Feeling of Stress : Not at all  Social Connections: Unknown (08/04/2023)   Social Connection and Isolation Panel [NHANES]    Frequency of Communication with Friends and Family: More than three times a week    Frequency of Social Gatherings with Friends and Family: Three times a week    Attends Religious Services: Patient declined    Active Member of Clubs or Organizations: No    Attends Banker Meetings: Never    Marital Status: Patient declined     FAMILY HISTORY:  We obtained a detailed, 4-generation family history.  Significant diagnoses are listed below: Family History  Problem Relation Age of Onset   Lung cancer Father 71       smoked   Lung cancer Sister 60       smoked   Brain cancer Brother 61   Lung cancer Brother 6       smoked   Breast cancer Paternal Aunt    Prostate cancer Paternal Uncle 33 - 99   Breast cancer Paternal Grandmother 34 - 12     Lori Wise is unaware of previous family history of genetic testing for hereditary cancer risks. There is no reported Ashkenazi Jewish ancestry.   GENETIC COUNSELING ASSESSMENT: Lori Wise is a 62 y.o. female with a personal and family history of cancer which is somewhat suggestive of a hereditary cancer syndrome and predisposition to cancer. We, therefore, discussed and recommended the following at today's visit.   DISCUSSION: We discussed that 5 - 10% of cancer is hereditary, with most cases of  hereditary breast cancer associated with mutations in BRCA1/2.  There are other genes that can be associated with hereditary breast cancer syndromes. Type of cancer risk and level of risk are gene-specific. We discussed that testing is beneficial for several reasons including knowing how to follow individuals after completing their treatment, identifying whether potential treatment options would be beneficial, and understanding if other family members could be at risk for cancer and allowing them to undergo genetic testing.   We reviewed the characteristics, features and inheritance patterns of hereditary cancer syndromes. We also discussed genetic testing, including the appropriate family members to test, the process of testing, insurance coverage and turn-around-time for results. We discussed the implications of a negative, positive and/or variant of uncertain significant result. In order to get genetic test results  in a timely manner so that Lori Wise can use these genetic test results for surgical decisions, we recommended Lori Wise pursue genetic testing for the BRCAplus panel. Once complete, we recommend Lori Wise pursue reflex genetic testing to a more comprehensive gene panel.   Lori Wise elected to have [facility] CancerNext-Expanded Panel. The CancerNext-Expanded gene panel offered by [facility] and includes sequencing, rearrangement, and RNA analysis for the following 76 genes: AIP, ALK, APC, ATM, AXIN2, BAP1, BARD1, BMPR1A, BRCA1, BRCA2, BRIP1, CDC73, CDH1, CDK4, CDKN1B, CDKN2A, CEBPA, CHEK2, CTNNA1, DDX41, DICER1, ETV6, FH, FLCN, GATA2, LZTR1, MAX, MBD4, MEN1, MET, MLH1, MSH2, MSH3, MSH6, MUTYH, NF1, NF2, NTHL1, PALB2, PHOX2B, PMS2, POT1, PRKAR1A, PTCH1, PTEN, RAD51C, RAD51D, RB1, RET, RUNX1, SDHA, SDHAF2, SDHB, SDHC, SDHD, SMAD4, SMARCA4, SMARCB1, SMARCE1, STK11, SUFU, TMEM127, TP53, TSC1, TSC2, VHL, and WT1 (sequencing and deletion/duplication); EGFR, HOXB13, KIT, MITF, PDGFRA, POLD1, and POLE  (sequencing only); EPCAM and GREM1 (deletion/duplication only).    Based on Lori Wise's personal and family history of cancer, she meets medical criteria for genetic testing. Despite that she meets criteria, she may still have an out of pocket cost. We discussed that if her out of pocket cost for testing is over $100, the laboratory should contact them to discuss self-pay prices, patient pay assistance programs, if applicable, and other billing options.   PLAN: After considering the risks, benefits, and limitations, Lori Wise provided informed consent to pursue genetic testing and the blood sample was sent to [facility] for analysis of the CancerNext-Expanded Panel. Results should be available within approximately 1-2 weeks' time, at which point they will be disclosed by telephone to Lori Wise, as will any additional recommendations warranted by these results. Lori Wise will receive a summary of her genetic counseling visit and a copy of her results once available. This information will also be available in Epic.   Lori Wise questions were answered to her satisfaction today. Our contact information was provided should additional questions or concerns arise. Thank you for the referral and allowing [place] to share in the care of your patient.   Lalla Brothers, MS, [facility] Genetic Counselor [email or web address] (P) [phone]  The patient was seen for a total of 20 minutes in face-to-face genetic counseling.  The patient brought her husband and two daughters. Drs. Pamelia Hoit and/or Mosetta Putt were available to discuss this case as needed.  _______________________________________________________________________ For Office Staff:  Number of people involved in session: 4 Was an Intern/ student involved with case: no

## 2023-10-23 ENCOUNTER — Other Ambulatory Visit: Payer: Self-pay | Admitting: Surgery

## 2023-10-23 ENCOUNTER — Encounter: Payer: Self-pay | Admitting: *Deleted

## 2023-10-23 ENCOUNTER — Telehealth: Payer: Self-pay | Admitting: Radiation Oncology

## 2023-10-23 DIAGNOSIS — C50311 Malignant neoplasm of lower-inner quadrant of right female breast: Secondary | ICD-10-CM

## 2023-10-23 DIAGNOSIS — Z853 Personal history of malignant neoplasm of breast: Secondary | ICD-10-CM

## 2023-10-23 NOTE — Telephone Encounter (Signed)
Called pt to schedule CON/SIM 4 weeks post-sx. Pt asked if it would be possible to get tx in [place] since she is away from that facility. I advised yes this would be possible and that the only thing would be she would need to get established with a provider at that location. Pt stated she liked being under Dr. Roselind Messier but wanted the convenience of being closer to home. MedOnc team advised via inbasket of pt request.

## 2023-10-27 ENCOUNTER — Encounter: Payer: Self-pay | Admitting: *Deleted

## 2023-10-28 ENCOUNTER — Telehealth: Payer: Self-pay | Admitting: *Deleted

## 2023-10-28 NOTE — Telephone Encounter (Signed)
Left message for a return phone call to follow up from [facility] 11/13 and assess navigation needs.

## 2023-10-30 ENCOUNTER — Encounter (HOSPITAL_BASED_OUTPATIENT_CLINIC_OR_DEPARTMENT_OTHER): Payer: Self-pay | Admitting: Surgery

## 2023-10-30 ENCOUNTER — Telehealth: Payer: Self-pay | Admitting: Cardiology

## 2023-10-30 ENCOUNTER — Other Ambulatory Visit: Payer: Self-pay

## 2023-10-30 NOTE — Telephone Encounter (Signed)
   Pre-operative Risk Assessment    Patient Name: Lori Wise  DOB: January 13, 1961 MRN: [id number]      Request for Surgical Clearance    Procedure:   Breast surgery for cancer  Date of Surgery:  Clearance 11/11/23                                 Surgeon:    Dr. Abigail Miyamoto Surgeon's Group or Practice Name:  [facility]  Phone number:  [phone]  Fax number:  [phone]   Type of Clearance Requested:   - Medical  - Pharmacy:  Hold Aspirin      Type of Anesthesia:  General    Additional requests/questions:   Caller [name]) noted patient will need clearance to have  breast surgery for cancer.  Signed, Annetta Maw   10/30/2023, 1:05 PM

## 2023-11-02 ENCOUNTER — Other Ambulatory Visit: Payer: Self-pay | Admitting: Physician Assistant

## 2023-11-02 DIAGNOSIS — F419 Anxiety disorder, unspecified: Secondary | ICD-10-CM

## 2023-11-02 NOTE — Telephone Encounter (Signed)
Spoke with Lori Wise at [facility] advised that pt does have appt for surgical clearance tomorrow at 2:45

## 2023-11-02 NOTE — Telephone Encounter (Signed)
Pt has scheduled app tomorrow for 1 yr f/u and clearance. Please advise

## 2023-11-02 NOTE — Telephone Encounter (Signed)
   Name: [name]  DOB: [birth year]  MRN: [id number]  Primary Cardiologist: Thomasene Ripple, DO  Chart reviewed as part of pre-operative protocol coverage. Because of Lori Wise's past medical history and time since last visit, she will require a follow-up in-office visit in order to better assess preoperative cardiovascular risk.  Pre-op covering staff: - Please schedule appointment and call patient to inform them. If patient already had an upcoming appointment within acceptable timeframe, please add "pre-op clearance" to the appointment notes so provider is aware. - Please contact requesting surgeon's office via preferred method (i.e, phone, fax) to inform them of need for appointment prior to surgery.  Aspirin hold parameters will be addressed at the time of in person visit.  Sharlene Dory, [name]  11/02/2023, 7:50 AM

## 2023-11-03 ENCOUNTER — Encounter: Payer: Self-pay | Admitting: General Practice

## 2023-11-03 ENCOUNTER — Ambulatory Visit: Payer: Commercial Managed Care - HMO | Attending: Cardiology | Admitting: Cardiology

## 2023-11-03 ENCOUNTER — Encounter: Payer: Self-pay | Admitting: Cardiology

## 2023-11-03 VITALS — BP 126/74 | HR 68 | Ht 65.0 in | Wt 145.2 lb

## 2023-11-03 DIAGNOSIS — E782 Mixed hyperlipidemia: Secondary | ICD-10-CM

## 2023-11-03 DIAGNOSIS — I10 Essential (primary) hypertension: Secondary | ICD-10-CM

## 2023-11-03 DIAGNOSIS — Z01818 Encounter for other preprocedural examination: Secondary | ICD-10-CM | POA: Diagnosis not present

## 2023-11-03 NOTE — Progress Notes (Signed)
Cardiology Office Note:  .   Date:  11/03/2023  ID:  Lori Wise, DOB 23-Jun-1961, MRN [id number] PCP: Marianne Sofia, [name]  [facility] Providers Cardiologist:  Thomasene Ripple, DO    History of Present Illness: .   Lori Wise is a 62 y.o. female with a past medical history of COPD, tobacco use, HTN, HLD. Patient followed by Dr. Servando Salina. Presents today for a preoperative evaluation prior to lumpectomy   Patient was first seen by Dr. Servando Salina in 03/2021 for evaluation of chest pain. She underwent coronary CTA 04/12/21 that showed a coronary calcium score of 0, no evidence of CAD. She has been on atenolol for her blood pressure, crestor and zetia for her cholesterol  Today, patient presents for a preoperative evaluation. She is scheduled for a breast cancer surgery next week. The cancer was discovered during a routine mammogram, which led to an ultrasound and subsequent biopsy. The biopsy revealed a small, stage one cancerous mass in the right breast, which was not palpable on physical examination. The left breast was found to be normal. The patient reports no recent cardiac symptoms, with no chest pain or breathing difficulties. She quit smoking in June and has noticed an improvement in her breathing since then. She uses inhalers for her COPD and emphysema only as needed.  The patient also reports occasional swelling in her ankles, particularly after long periods of sitting, such as during a recent road trip. However, the swelling resolves after a period of rest. The patient's blood pressure has been well-controlled at home, and she has been adhering to her prescribed medication regimen. She had temporarily stopped taking aspirin a few months ago due to nosebleeds but has since resumed. The patient's functional capacity is good, with the ability to perform various physical activities without issues.  ROS: Denies chest pain, palpitations, shortness of breath, dizziness, syncope, near syncope  Studies  Reviewed: .   Cardiac Studies & Procedures          CT SCANS  CT CORONARY MORPH W/CTA COR W/SCORE 04/12/2021  Addendum 04/12/2021  4:06 PM ADDENDUM REPORT: 04/12/2021 16:03  EXAM: OVER-READ INTERPRETATION  CT CHEST  The following report is an over-read performed by radiologist Dr. Deberah Pelton [facility] on 04/12/2021. This over-read does not include interpretation of cardiac or coronary anatomy or pathology. The coronary calcium score/coronary CTA interpretation by the cardiologist is attached.  COMPARISON:  None.  FINDINGS: The visualized portions of the lower lung fields show no suspicious nodules, masses, or infiltrates. No pleural fluid seen.  The visualized portions of the mediastinum and chest wall are unremarkable.  IMPRESSION: No significant non-cardiovascular abnormality in visualized portion of the thorax.   Electronically Signed By: Danae Orleans M.D. On: 04/12/2021 16:03  Narrative CLINICAL DATA:  Chest pain  EXAM: Cardiac/Coronary CTA  TECHNIQUE: A non-contrast, gated CT scan was obtained with axial slices of 3 mm through the heart for calcium scoring. Calcium scoring was performed using the Agatston method. A 100 kV prospective, gated, contrast cardiac scan was obtained. Gantry rotation speed was 250 msecs and collimation was 0.6 mm. Two sublingual nitroglycerin tablets (0.8 mg) were given. The 3D data set was reconstructed in 5% intervals of the 35-75% of the R-R cycle. Diastolic phases were analyzed on a dedicated workstation using MPR, MIP, and VRT modes. The patient received 80 cc of contrast.  FINDINGS: Image quality: Excellent.  Noise artifact is: Limited.  Coronary Arteries:  Normal coronary origin.  Left dominance.  Left main: The left main is a large caliber vessel with a normal take off from the left coronary cusp that bifurcates to form a left anterior descending artery and a left circumflex artery. There is no plaque  or stenosis.  Left anterior descending artery: The LAD is patent without evidence of plaque or stenosis. The LAD gives off 1 patent diagonal branch.  Left circumflex artery: The LCX is dominant and patent with no evidence of plaque or stenosis. The LCX gives off 2 patent branches, terminates into PDA.  Right coronary artery: The RCA is small non-dominant. There is no evidence of plaque or stenosis.  Right Atrium: Right atrial size is within normal limits.  Right Ventricle: The right ventricular cavity is within normal limits.  Left Atrium: Left atrial size is normal in size with no left atrial appendage filling defect.  Left Ventricle: The ventricular cavity size is within normal limits.  Pulmonary arteries: Normal in size without proximal filling defect.  Pulmonary veins: Normal pulmonary venous drainage.  Pericardium: Normal thickness with no significant effusion or calcium present.  Cardiac valves: The aortic valve is trileaflet without significant calcification. The mitral valve is normal structure without significant calcification.  Aorta: Normal caliber with no significant disease. Descending aorta atherosclerosis.  Extra-cardiac findings: See attached radiology report for non-cardiac structures.  IMPRESSION: 1. Coronary calcium score of 0. This was 0 percentile for age and sex matched controls.  2. Normal coronary origin with left dominance.  3. No evidence of CAD.  4. Descending aorta atherosclerosis.  RECOMMENDATIONS: 1. CAD-RADS 0: No evidence of CAD (0%). Consider non-atherosclerotic causes of chest pain.  Electronically Signed: By: Donato Schultz MD On: 04/12/2021 14:38          Risk Assessment/Calculations:             Physical Exam:   VS:  BP 126/74   Pulse 68   Ht 5\' 5"  (1.651 m)   Wt 145 lb 3.2 oz (65.9 kg)   SpO2 96%   BMI 24.16 kg/m    Wt Readings from Last 3 Encounters:  11/03/23 145 lb 3.2 oz (65.9 kg)  10/21/23 144 lb 9.6 oz  (65.6 kg)  08/04/23 144 lb (65.3 kg)    GEN: Well nourished, well developed in no acute distress. Sitting comfortably in the chair  NECK: No JVD CARDIAC: RRR, no murmurs, rubs, gallops RESPIRATORY:  Clear to auscultation without rales, wheezing or rhonchi. Normal work of breathing on room air  ABDOMEN: Soft, non-tender, non-distended EXTREMITIES:  No edema in BLE; No deformity   ASSESSMENT AND PLAN: .    Preoperative Evaluation  - Patient is scheduled for a lumpectomy next week after her recent diagnosis of stage I Breast cancer.  - Denies any cardiac concerns- no chest pain, shortness of breath, dizziness, syncope, near syncope, dizziness  - Previously underwent coronary CTA in 2022 that revealed a coronary calcium score of 0, no evidence of CAD  - According to the Duke Activity Status Index, she is able to complete at least 7.59 METs of physical activity  - In the past, coronary CTA was reassuring. Patient does not have cardiac symptoms and has excellent functional capacity. No need for further cardiology workup prior to surgery. OK to proceed as scheduled  - OK to hold aspirin as needed prior to surgery. Recommend resuming as soon as adequate hemostasis achieved post operatively   HLD  - Lipid panel from 07/2023 showed LDL 65, HDL 58, triglycerides 131, total cholesterol  146  - Continue crestor 5 mg daily and zetia 10 mg daily   HTN  - BP well controlled. Denies symptoms of orthostatic hypotension  - Continue atenolol 25 mg daily     Dispo: Follow up in 1 year with Dr. Bing Matter   Signed, Jonita Albee, [name]

## 2023-11-03 NOTE — Progress Notes (Signed)
   10/30/23 1535  PAT Phone Screen  Is the patient taking a GLP-1 receptor agonist? No  Do You Have Diabetes? No  Do You Have Hypertension? Yes  Have You Ever Been to the ER for Asthma? No  Have You Taken Oral Steroids in the Past 3 Months? No  Do you Take Phenteramine or any Other Diet Drugs? No  Recent  Lab Work, EKG, CXR? No  Do you have a history of heart problems? No  Cardiologist Name (S)  cardiac clearance requested last ov w/ Dr Bing Matter 3//29/2023. Followed for hx of CP, HLD and HTN  Have you ever had tests on your heart? Yes  What cardiac tests were performed? Other (comment)  What date/year were cardiac tests completed? 04/12/21 normal coronary ct - no cad, calcium score 0  Results viewable: [facility] Media Tab  Any Recent Hospitalizations? No  Height 5\' 5"  (1.651 m)  Weight 65.6 kg  Pat Appointment Scheduled (S)  Yes (Will come for EKG if not done w/ cardiac clearance.)   Cardiac clearance received and placed on chart. EKG done at visit. 11/03/23 3:45pm

## 2023-11-03 NOTE — Progress Notes (Signed)
[facility] Spiritual Care Note  Followed up with Lori Wise by phone after meeting in [facility]. She was in good spirits overall, looking forward to Thanksgiving with family. She notes that she may feel more apprehensive as she gets closer to the surgery date and welcomes prayers.   Provided empathic listening, emotional support, assistance with identifying questions to ask of nurse navigator, and blessing before surgery. We plan to follow up the week after surgery for pastoral check-in.   [address] [place], [place], [facility] Pager [phone] Voicemail [phone]

## 2023-11-03 NOTE — Patient Instructions (Signed)
Medication Instructions:  Your physician recommends that you continue on your current medications as directed. Please refer to the Current Medication list given to you today.  *If you need a refill on your cardiac medications before your next appointment, please call your pharmacy*    Follow-Up: At [facility], you and your health needs are our priority.  As part of our continuing mission to provide you with exceptional heart care, we have created designated Provider Care Teams.  These Care Teams include your primary Cardiologist (physician) and Advanced Practice Providers (APPs -  Physician Assistants and Nurse Practitioners) who all work together to provide you with the care you need, when you need it.  Your next appointment:   1 year(s)  Provider:   Gypsy Balsam, MD

## 2023-11-04 ENCOUNTER — Encounter: Payer: Self-pay | Admitting: Genetic Counselor

## 2023-11-04 ENCOUNTER — Telehealth: Payer: Self-pay | Admitting: Radiation Oncology

## 2023-11-04 ENCOUNTER — Telehealth: Payer: Self-pay | Admitting: Genetic Counselor

## 2023-11-04 ENCOUNTER — Encounter: Payer: Self-pay | Admitting: *Deleted

## 2023-11-04 ENCOUNTER — Telehealth: Payer: Self-pay

## 2023-11-04 DIAGNOSIS — Z1379 Encounter for other screening for genetic and chromosomal anomalies: Secondary | ICD-10-CM | POA: Insufficient documentation

## 2023-11-04 DIAGNOSIS — Z170 Estrogen receptor positive status [ER+]: Secondary | ICD-10-CM

## 2023-11-04 NOTE — Telephone Encounter (Signed)
I contacted Ms. Partridge to discuss her genetic testing results. No pathogenic variants were identified in the first 13 genes analyzed. Of note, we are still waiting on the pan-cancer panel. Detailed clinic note to follow.  The test report has been scanned into EPIC and is located under the Molecular Pathology section of the Results Review tab.  A portion of the result report is included below for reference.   Lalla Brothers, MS, [facility] Genetic Counselor [email or web address] (P) [phone]

## 2023-11-04 NOTE — Telephone Encounter (Signed)
Pt called the office wanting to check on her RadOnc referral that was to be sent to our office from [facility] since noone has called her to schedule an appt.   I did advise pt that as of yet we have not received the formal referral but that I would be glad to have our radiation nurse, Katrina take a look into her chart to determine what all is to be done.   Upon review, pt is to have Sx on 11/11/23 and will start Radiation 4w post sx, which Dyane Dustman said was fine to schedule a consult in about 3 weeks after surgery to get her in our office for a consult and SIM.    Pt aware that we will communicate with [facility] to let them know we have her scheduled and to just make sure referral is sent once ready.

## 2023-11-04 NOTE — Telephone Encounter (Signed)
11/27 @ 2:55 pm Patient called with concerns about her referral for [facility] as requested.  She stated no one has called her to be setup for consult and radiation treatments.  Inbasket sent to [facility] and copied Angie Fava and Dr. Pamelia Hoit so they are aware.

## 2023-11-10 ENCOUNTER — Inpatient Hospital Stay
Admission: RE | Admit: 2023-11-10 | Discharge: 2023-11-10 | Discharge status: Home / Self Care | Payer: Medicaid Other | Source: Ambulatory Visit | Attending: Surgery | Admitting: Surgery

## 2023-11-10 DIAGNOSIS — Z853 Personal history of malignant neoplasm of breast: Secondary | ICD-10-CM

## 2023-11-10 HISTORY — PX: BREAST BIOPSY: SHX20

## 2023-11-10 NOTE — H&P (Signed)
REFERRING PHYSICIAN: Sabas Sous, MD PROVIDER: Wayne Both, MD MRN: [id number] DOB: [birth year]  Subjective   Chief Complaint: Breast Cancer  History of Present Illness: Lori Wise is a 62 y.o. female who is seen as an office consultation for evaluation of Breast Cancer  This is a 62 year old female who was found on recent screen mammography of distortion in the right breast. She underwent a ultrasound showing a 1.1 cm mass at approximately 6 o'clock position of the left breast. Ultrasound the axilla was unremarkable. She had a biopsy of the mass showing a grade 1 invasive lobular carcinoma. It was 95% ER/PR positive, HER2 negative, and had a Ki-67 of 3%. She has had no previous problems regarding her breast. She has a family history of lung cancer as well as breast cancer in maternal grandmother. She denies nipple discharge. She does have a history of COPD and only recently quit smoking in June. She has had no problems with anesthesia with prior surgeries.  Review of Systems: A complete review of systems was obtained from the patient. I have reviewed this information and discussed as appropriate with the patient. See HPI as well for other ROS.  ROS   Medical History: Past Medical History:  Diagnosis Date  Anxiety  COPD (chronic obstructive pulmonary disease) (CMS/HHS-HCC)  GERD (gastroesophageal reflux disease)  History of cancer  Hyperlipidemia  Hypertension  Thyroid disease   Patient Active Problem List  Diagnosis  Chronic obstructive pulmonary disease with (acute) lower respiratory infection (CMS/HHS-HCC)  GERD without esophagitis  Malignant neoplasm of lower-inner quadrant of right breast of female, estrogen receptor positive (CMS/HHS-HCC)  Primary hypertension   Past Surgical History:  Procedure Laterality Date  Left Breast Biopsy Left 10/15/2023  Right Breast Biopsy Right 10/15/2023  Cardiac Catherization N/A  2000  HYSTERECTOMY N/A    Allergies   Allergen Reactions  Codeine Other (See Comments), Hives, Itching, Nausea And Vomiting and Rash  Nitrofurantoin Other (See Comments)  GI intolerance   Current Outpatient Medications on File Prior to Visit  Medication Sig Dispense Refill  albuterol MDI, PROVENTIL, VENTOLIN, PROAIR, HFA 90 mcg/actuation inhaler Inhale 2 inhalations into the lungs every 6 (six) hours as needed for Wheezing or Shortness of Breath  ALPRAZolam (XANAX) 0.5 MG tablet Take 1 tablet by mouth 3 (three) times daily  aspirin 81 MG chewable tablet Take 81 mg by mouth once daily  estradioL (ESTRACE) 0.5 MG tablet Take 0.5 mg by mouth once daily  ezetimibe (ZETIA) 10 mg tablet Take 1 tablet by mouth once daily  fluticasone propionate (FLONASE) 50 mcg/actuation nasal spray Place 2 sprays into one nostril once daily  ipratropium-albuteroL (DUO-NEB) nebulizer solution Take 3 mLs by nebulization every 4 (four) hours as needed for Wheezing or Shortness of Breath  omeprazole (PRILOSEC) 20 MG DR capsule Take 20 mg by mouth 2 (two) times daily before meals   No current facility-administered medications on file prior to visit.   Family History  Problem Relation Age of Onset  Lung cancer Father  Brain cancer Brother    Social History   Tobacco Use  Smoking Status Former  Types: Cigarettes  Smokeless Tobacco Never  Tobacco Comments  Quit smoking cigarettes 05/28/2023    Social History   Socioeconomic History  Marital status: Legally Separated  Tobacco Use  Smoking status: Former  Types: Cigarettes  Smokeless tobacco: Never  Tobacco comments:  Quit smoking cigarettes 05/28/2023  Vaping Use  Vaping status: Every Day  Substance and Sexual Activity  Alcohol use:  Yes  Comment: "wine occasionally"  Drug use: Never   Social Drivers of Corporate investment banker Strain: Low Risk (08/04/2023)  Received from [facility]  Overall Financial Resource Strain (CARDIA)  Difficulty of Paying Living Expenses: Not hard at  all  Food Insecurity: No Food Insecurity (08/04/2023)  Received from [facility]  Hunger Vital Sign  Worried About Running Out of Food in the Last Year: Never true  Ran Out of Food in the Last Year: Never true  Transportation Needs: No Transportation Needs (08/04/2023)  Received from [facility] - Transportation  Lack of Transportation (Medical): No  Lack of Transportation (Non-Medical): No  Physical Activity: Inactive (08/04/2023)  Received from [facility]  Exercise Vital Sign  Days of Exercise per Week: 0 days  Minutes of Exercise per Session: 0 min  Stress: No Stress Concern Present (08/04/2023)  Received from [facility] of Occupational Health - Occupational Stress Questionnaire  Feeling of Stress : Not at all  Social Connections: Unknown (08/04/2023)  Received from [facility]  Social Connection and Isolation Panel [NHANES]  Frequency of Communication with Friends and Family: More than three times a week  Frequency of Social Gatherings with Friends and Family: Three times a week  Attends Religious Services: Patient declined  Active Member of Clubs or Organizations: No  Attends Banker Meetings: Never  Marital Status: Patient declined   Objective:  There were no vitals filed for this visit.  There is no height or weight on file to calculate BMI.  Physical Exam   She appears well on exam  There is bruising at the right breast at approximate the 5 o'clock position. There are no palpable masses in the breast. The nipple areolar complex is normal. There is no axillary adenopathy.  Labs, Imaging and Diagnostic Testing: I reviewed her mammograms, ultrasound, and pathology results as well as notes in the electronic medical records  Assessment and Plan:   Diagnoses and all orders for this visit:  Invasive lobular carcinoma of right breast, stage 1 (CMS/HHS-HCC)  Lower inner right breast   We have discussed her in our  multidisciplinary breast cancer conference this morning. I discussed the diagnosis with the patient and her family. From a surgical standpoint we then discussed surgery for breast cancer. We discussed both breast conservation with a lumpectomy versus mastectomy and the long-term results of each. We also discussed sentinel lymph node biopsy and the reasons to perform this to rule out spread of cancer to the lymph nodes. After discussion she wished to proceed with breast conservation. I next discussed proceeding with the right breast radioactive seed guided lumpectomy and sentinel node biopsy. I again explained the surgical procedure in detail. We discussed the risk of the surgery which includes but is not limited to bleeding, infection, seroma formation, the need for further surgery if the lymph nodes or margins are positive, cardiopulmonary issues with anesthesia, injury to surrounding structures, arm swelling, postoperative recovery, etc. She understands and wished to proceed with surgery which will be scheduled.

## 2023-11-11 ENCOUNTER — Ambulatory Visit
Admission: RE | Admit: 2023-11-11 | Discharge: 2023-11-11 | Disposition: A | Discharge status: Home / Self Care | Payer: Managed Care, Other (non HMO) | Source: Ambulatory Visit | Attending: Surgery | Admitting: Surgery

## 2023-11-11 ENCOUNTER — Other Ambulatory Visit: Payer: Self-pay

## 2023-11-11 ENCOUNTER — Ambulatory Visit (HOSPITAL_BASED_OUTPATIENT_CLINIC_OR_DEPARTMENT_OTHER): Payer: Commercial Managed Care - HMO | Admitting: Anesthesiology

## 2023-11-11 ENCOUNTER — Ambulatory Visit (HOSPITAL_BASED_OUTPATIENT_CLINIC_OR_DEPARTMENT_OTHER)
Admission: RE | Admit: 2023-11-11 | Discharge: 2023-11-11 | Disposition: A | Discharge status: Home / Self Care | Payer: Commercial Managed Care - HMO | Attending: Surgery | Admitting: Surgery

## 2023-11-11 ENCOUNTER — Encounter (HOSPITAL_BASED_OUTPATIENT_CLINIC_OR_DEPARTMENT_OTHER): Payer: Self-pay | Admitting: Surgery

## 2023-11-11 ENCOUNTER — Encounter (HOSPITAL_BASED_OUTPATIENT_CLINIC_OR_DEPARTMENT_OTHER)
Admission: RE | Disposition: A | Discharge status: Home / Self Care | Payer: Self-pay | Source: Home / Self Care | Attending: Surgery

## 2023-11-11 DIAGNOSIS — J449 Chronic obstructive pulmonary disease, unspecified: Secondary | ICD-10-CM | POA: Insufficient documentation

## 2023-11-11 DIAGNOSIS — Z79899 Other long term (current) drug therapy: Secondary | ICD-10-CM | POA: Diagnosis not present

## 2023-11-11 DIAGNOSIS — C50911 Malignant neoplasm of unspecified site of right female breast: Secondary | ICD-10-CM | POA: Diagnosis not present

## 2023-11-11 DIAGNOSIS — Z803 Family history of malignant neoplasm of breast: Secondary | ICD-10-CM | POA: Insufficient documentation

## 2023-11-11 DIAGNOSIS — C50311 Malignant neoplasm of lower-inner quadrant of right female breast: Secondary | ICD-10-CM | POA: Insufficient documentation

## 2023-11-11 DIAGNOSIS — K219 Gastro-esophageal reflux disease without esophagitis: Secondary | ICD-10-CM | POA: Insufficient documentation

## 2023-11-11 DIAGNOSIS — Z170 Estrogen receptor positive status [ER+]: Secondary | ICD-10-CM | POA: Diagnosis not present

## 2023-11-11 DIAGNOSIS — Z1721 Progesterone receptor positive status: Secondary | ICD-10-CM | POA: Diagnosis not present

## 2023-11-11 DIAGNOSIS — I10 Essential (primary) hypertension: Secondary | ICD-10-CM

## 2023-11-11 DIAGNOSIS — N6011 Diffuse cystic mastopathy of right breast: Secondary | ICD-10-CM | POA: Diagnosis not present

## 2023-11-11 DIAGNOSIS — Z87891 Personal history of nicotine dependence: Secondary | ICD-10-CM | POA: Diagnosis not present

## 2023-11-11 DIAGNOSIS — F419 Anxiety disorder, unspecified: Secondary | ICD-10-CM | POA: Diagnosis not present

## 2023-11-11 DIAGNOSIS — Z853 Personal history of malignant neoplasm of breast: Secondary | ICD-10-CM

## 2023-11-11 HISTORY — PX: BREAST LUMPECTOMY WITH RADIOACTIVE SEED AND SENTINEL LYMPH NODE BIOPSY: SHX6550

## 2023-11-11 SURGERY — BREAST LUMPECTOMY WITH RADIOACTIVE SEED AND SENTINEL LYMPH NODE BIOPSY
Anesthesia: Regional | Site: Breast | Laterality: Right

## 2023-11-11 MED ORDER — EPHEDRINE 5 MG/ML INJ
INTRAVENOUS | Status: AC
  Filled 2023-11-11: qty 5

## 2023-11-11 MED ORDER — CEFAZOLIN SODIUM-DEXTROSE 2-4 GM/100ML-% IV SOLN
INTRAVENOUS | Status: AC
  Filled 2023-11-11: qty 100

## 2023-11-11 MED ORDER — ACETAMINOPHEN 500 MG PO TABS: 1000.0000 mg | ORAL_TABLET | ORAL | Status: DC

## 2023-11-11 MED ORDER — ACETAMINOPHEN 500 MG PO TABS
1000.0000 mg | ORAL_TABLET | Freq: Once | ORAL | Status: AC
  Administered 2023-11-11: 1000 mg via ORAL

## 2023-11-11 MED ORDER — OXYCODONE HCL 5 MG/5ML PO SOLN: 5.0000 mg | Freq: Once | ORAL | Status: DC | PRN

## 2023-11-11 MED ORDER — PROPOFOL 10 MG/ML IV BOLUS
INTRAVENOUS | Status: DC | PRN
  Administered 2023-11-11: 150 mg via INTRAVENOUS

## 2023-11-11 MED ORDER — LIDOCAINE 2% (20 MG/ML) 5 ML SYRINGE
INTRAMUSCULAR | Status: DC | PRN
  Administered 2023-11-11: 100 mg via INTRAVENOUS

## 2023-11-11 MED ORDER — MIDAZOLAM HCL 2 MG/2ML IJ SOLN
2.0000 mg | Freq: Once | INTRAMUSCULAR | Status: AC
  Administered 2023-11-11: 2 mg via INTRAVENOUS

## 2023-11-11 MED ORDER — MAGTRACE LYMPHATIC TRACER
INTRAMUSCULAR | Status: DC | PRN
  Administered 2023-11-11: 2 mL via INTRAMUSCULAR

## 2023-11-11 MED ORDER — CELECOXIB 200 MG PO CAPS
ORAL_CAPSULE | ORAL | Status: AC
  Filled 2023-11-11: qty 1

## 2023-11-11 MED ORDER — EPHEDRINE SULFATE-NACL 50-0.9 MG/10ML-% IV SOSY
PREFILLED_SYRINGE | INTRAVENOUS | Status: DC | PRN
  Administered 2023-11-11: 10 mg via INTRAVENOUS

## 2023-11-11 MED ORDER — TRAMADOL HCL 50 MG PO TABS: 50.0000 mg | ORAL_TABLET | Freq: Four times a day (QID) | ORAL | 0 refills | Status: DC | PRN

## 2023-11-11 MED ORDER — CEFAZOLIN SODIUM-DEXTROSE 2-4 GM/100ML-% IV SOLN
2.0000 g | INTRAVENOUS | Status: AC
  Administered 2023-11-11: 2 g via INTRAVENOUS

## 2023-11-11 MED ORDER — ONDANSETRON HCL 4 MG/2ML IJ SOLN
INTRAMUSCULAR | Status: DC | PRN
  Administered 2023-11-11: 4 mg via INTRAVENOUS

## 2023-11-11 MED ORDER — DEXMEDETOMIDINE HCL IN NACL 80 MCG/20ML IV SOLN
INTRAVENOUS | Status: AC
  Filled 2023-11-11: qty 20

## 2023-11-11 MED ORDER — BUPIVACAINE-EPINEPHRINE 0.5% -1:200000 IJ SOLN
INTRAMUSCULAR | Status: DC | PRN
  Administered 2023-11-11: 20 mL

## 2023-11-11 MED ORDER — LACTATED RINGERS IV SOLN: INTRAVENOUS | Status: DC

## 2023-11-11 MED ORDER — ONDANSETRON HCL 4 MG/2ML IJ SOLN: 4.0000 mg | Freq: Once | INTRAMUSCULAR | Status: DC | PRN

## 2023-11-11 MED ORDER — BUPIVACAINE HCL (PF) 0.25 % IJ SOLN
INTRAMUSCULAR | Status: DC | PRN
  Administered 2023-11-11: 30 mL

## 2023-11-11 MED ORDER — FENTANYL CITRATE (PF) 100 MCG/2ML IJ SOLN
25.0000 ug | INTRAMUSCULAR | Status: DC | PRN
Start: 2023-11-11 — End: 2023-11-11
  Administered 2023-11-11 (×2): 50 ug via INTRAVENOUS

## 2023-11-11 MED ORDER — ACETAMINOPHEN 325 MG PO TABS: 325.0000 mg | ORAL_TABLET | ORAL | Status: DC | PRN

## 2023-11-11 MED ORDER — PROPOFOL 500 MG/50ML IV EMUL
INTRAVENOUS | Status: AC
  Filled 2023-11-11: qty 100

## 2023-11-11 MED ORDER — SODIUM CHLORIDE 0.9 % IV SOLN: INTRAVENOUS | Status: DC | PRN

## 2023-11-11 MED ORDER — FENTANYL CITRATE (PF) 100 MCG/2ML IJ SOLN
100.0000 ug | Freq: Once | INTRAMUSCULAR | Status: AC
  Administered 2023-11-11: 100 ug via INTRAVENOUS

## 2023-11-11 MED ORDER — DEXAMETHASONE SODIUM PHOSPHATE 10 MG/ML IJ SOLN
INTRAMUSCULAR | Status: DC | PRN
  Administered 2023-11-11: 10 mg via INTRAVENOUS

## 2023-11-11 MED ORDER — ONDANSETRON HCL 4 MG/2ML IJ SOLN
INTRAMUSCULAR | Status: AC
  Filled 2023-11-11: qty 2

## 2023-11-11 MED ORDER — FENTANYL CITRATE (PF) 100 MCG/2ML IJ SOLN
INTRAMUSCULAR | Status: AC
  Filled 2023-11-11: qty 2

## 2023-11-11 MED ORDER — ENSURE PRE-SURGERY PO LIQD: 296.0000 mL | Freq: Once | ORAL | Status: DC

## 2023-11-11 MED ORDER — MEPERIDINE HCL 25 MG/ML IJ SOLN: 6.2500 mg | INTRAMUSCULAR | Status: DC | PRN

## 2023-11-11 MED ORDER — MIDAZOLAM HCL 2 MG/2ML IJ SOLN
INTRAMUSCULAR | Status: AC
  Filled 2023-11-11: qty 2

## 2023-11-11 MED ORDER — OXYCODONE HCL 5 MG PO TABS: 5.0000 mg | ORAL_TABLET | Freq: Once | ORAL | Status: DC | PRN

## 2023-11-11 MED ORDER — CHLORHEXIDINE GLUCONATE CLOTH 2 % EX PADS: 6.0000 | MEDICATED_PAD | Freq: Once | CUTANEOUS | Status: DC

## 2023-11-11 MED ORDER — LIDOCAINE 2% (20 MG/ML) 5 ML SYRINGE
INTRAMUSCULAR | Status: AC
  Filled 2023-11-11: qty 5

## 2023-11-11 MED ORDER — CELECOXIB 200 MG PO CAPS
200.0000 mg | ORAL_CAPSULE | Freq: Once | ORAL | Status: AC
  Administered 2023-11-11: 200 mg via ORAL

## 2023-11-11 MED ORDER — DEXAMETHASONE SODIUM PHOSPHATE 10 MG/ML IJ SOLN
INTRAMUSCULAR | Status: AC
Start: 2023-11-11 — End: ?
  Filled 2023-11-11: qty 1

## 2023-11-11 MED ORDER — ACETAMINOPHEN 500 MG PO TABS
ORAL_TABLET | ORAL | Status: AC
  Filled 2023-11-11: qty 2

## 2023-11-11 MED ORDER — OXYCODONE HCL 5 MG PO TABS
ORAL_TABLET | ORAL | Status: AC
  Filled 2023-11-11: qty 1

## 2023-11-11 MED ORDER — ACETAMINOPHEN 160 MG/5ML PO SOLN: 325.0000 mg | ORAL | Status: DC | PRN

## 2023-11-11 SURGICAL SUPPLY — 37 items
APPLIER CLIP 9.375 MED OPEN (MISCELLANEOUS) ×1
BLADE SURG 15 STRL LF DISP TIS (BLADE) ×1 IMPLANT
CANISTER SUCT 1200ML W/VALVE (MISCELLANEOUS) IMPLANT
CHLORAPREP W/TINT 26 (MISCELLANEOUS) ×1 IMPLANT
CLIP APPLIE 9.375 MED OPEN (MISCELLANEOUS) ×1 IMPLANT
CLIP TI WIDE RED SMALL 6 (CLIP) IMPLANT
COVER BACK TABLE 60X90IN (DRAPES) ×1 IMPLANT
COVER MAYO STAND STRL (DRAPES) ×1 IMPLANT
COVER PROBE CYLINDRICAL 5X96 (MISCELLANEOUS) ×1 IMPLANT
DERMABOND ADVANCED .7 DNX12 (GAUZE/BANDAGES/DRESSINGS) ×1 IMPLANT
DRAPE LAPAROSCOPIC ABDOMINAL (DRAPES) ×1 IMPLANT
DRAPE UTILITY XL STRL (DRAPES) ×1 IMPLANT
ELECT REM PT RETURN 9FT ADLT (ELECTROSURGICAL) ×1
ELECTRODE REM PT RTRN 9FT ADLT (ELECTROSURGICAL) ×1 IMPLANT
GAUZE SPONGE 4X4 12PLY STRL LF (GAUZE/BANDAGES/DRESSINGS) IMPLANT
GLOVE SURG SIGNA 7.5 PF LTX (GLOVE) ×1 IMPLANT
GOWN STRL REUS W/ TWL LRG LVL3 (GOWN DISPOSABLE) ×1 IMPLANT
GOWN STRL REUS W/ TWL XL LVL3 (GOWN DISPOSABLE) ×1 IMPLANT
KIT MARKER MARGIN INK (KITS) ×1 IMPLANT
NDL HYPO 25X1 1.5 SAFETY (NEEDLE) ×1 IMPLANT
NDL SAFETY ECLIPSE 18X1.5 (NEEDLE) ×1 IMPLANT
NEEDLE HYPO 25X1 1.5 SAFETY (NEEDLE) ×1
NS IRRIG 1000ML POUR BTL (IV SOLUTION) ×1 IMPLANT
PACK BASIN DAY SURGERY FS (CUSTOM PROCEDURE TRAY) ×1 IMPLANT
PENCIL SMOKE EVACUATOR (MISCELLANEOUS) ×1 IMPLANT
SLEEVE SCD COMPRESS KNEE MED (STOCKING) ×1 IMPLANT
SPIKE FLUID TRANSFER (MISCELLANEOUS) IMPLANT
SPONGE T-LAP 4X18 ~~LOC~~+RFID (SPONGE) ×1 IMPLANT
SUT MNCRL AB 4-0 PS2 18 (SUTURE) ×1 IMPLANT
SUT SILK 2 0 SH (SUTURE) IMPLANT
SUT VIC AB 3-0 SH 27X BRD (SUTURE) ×1 IMPLANT
SYR CONTROL 10ML LL (SYRINGE) ×1 IMPLANT
TOWEL GREEN STERILE FF (TOWEL DISPOSABLE) ×1 IMPLANT
TRACER MAGTRACE VIAL (MISCELLANEOUS) IMPLANT
TRAY FAXITRON CT DISP (TRAY / TRAY PROCEDURE) ×1 IMPLANT
TUBE CONNECTING 20X1/4 (TUBING) IMPLANT
YANKAUER SUCT BULB TIP NO VENT (SUCTIONS) IMPLANT

## 2023-11-11 NOTE — Anesthesia Postprocedure Evaluation (Signed)
Anesthesia Post Note  Patient: JORETHA WILBY  Procedure(s) Performed: RIGHT BREAST SEED LOCALIZED  LUMPECTOMY AND SENTINEL NODE BIOPSY (Right: Breast)     Patient location during evaluation: PACU Anesthesia Type: Regional and General Level of consciousness: awake and alert Pain management: pain level controlled Vital Signs Assessment: post-procedure vital signs reviewed and stable Respiratory status: spontaneous breathing, nonlabored ventilation and respiratory function stable Cardiovascular status: blood pressure returned to baseline and stable Postop Assessment: no apparent nausea or vomiting Anesthetic complications: no  No notable events documented.  Last Vitals:  Vitals:   11/11/23 1745 11/11/23 1830  BP: (!) 111/54 (!) 124/59  Pulse: 77 75  Resp: 16   Temp:  (!) 36.4 C  SpO2: 96% 98%    Last Pain:  Vitals:   11/11/23 1830  TempSrc: Temporal  PainSc: 0-No pain                 Liela Rylee,W. EDMOND

## 2023-11-11 NOTE — Transfer of Care (Signed)
Immediate Anesthesia Transfer of Care Note  Patient: Lori Wise  Procedure(s) Performed: RIGHT BREAST SEED LOCALIZED  LUMPECTOMY AND SENTINEL NODE BIOPSY (Right: Breast)  Patient Location: PACU  Anesthesia Type:General  Level of Consciousness: drowsy  Airway & Oxygen Therapy: Patient Spontanous Breathing and Patient connected to face mask oxygen  Post-op Assessment: Report given to RN and Post -op Vital signs reviewed and stable  Post vital signs: Reviewed and stable  Last Vitals:  Vitals Value Taken Time  BP    Temp    Pulse 68 11/11/23 1717  Resp 13 11/11/23 1717  SpO2 100 % 11/11/23 1717  Vitals shown include unfiled device data.  Last Pain:  Vitals:   11/11/23 1305  TempSrc: Temporal  PainSc: 0-No pain      Patients Stated Pain Goal: 3 (11/11/23 1305)  Complications: No notable events documented.

## 2023-11-11 NOTE — Op Note (Signed)
   Lori Wise 11/11/2023   Pre-op Diagnosis: RIGHT BREAST CANCER     Post-op Diagnosis: same  Procedure(s): RADIOACTIVE SEED GUIDED RIGHT BREAST LUMPECTOMY DEEP RIGHT AXILLARY SENTINEL LYMPH NODE BIOPSY INJECTION OF MAG TRACE FOR LYMPH NODE MAPPING   Surgeon(s): Abigail Miyamoto, MD  Anesthesia: General  Staff:  Circulator: Raliegh Scarlet, RN Scrub Person: Rolla Etienne  Estimated Blood Loss: Minimal               Specimens: sent to path  Indications: This is a 62 year old female who was found to have a mass in the right breast on screen mammography.  A biopsy of the mass at the 6 o'clock position showed an invasive lobular carcinoma.  The decision was made to proceed with a radioactive seed guided breast lumpectomy and deep axillary sentinel lymph node biopsy  Procedure: The patient was brought to the operating identifies the correct patient.  She was placed upon on the operating table and general anesthesia was induced.  I injected mag trace underneath the right nipple areolar complex and massaged the breast.  Her right breast and axilla were then prepped and draped in usual sterile fashion.  Using the neoprobe I located the reactive seed at the 6 o'clock position of the right breast just inferior to the nipple areolar complex.  I anesthetized the inferior edge of the nipple areolar complex with Marcaine and then made a circumareolar incision with a scalpel.  I then dissected down slightly laterally and then into the breast tissue with the cautery.  With the aid of neoprobe I then stayed widely around the radioactive seed performing a lumpectomy going down to the chest wall.  Once the lumpectomy was completed I marked all margins with paint.  An x-ray was performed on the specimen confirming the radioactive seed and biopsy clip were in the specimen.  The anterior margin is skin.  The specimen was sent to pathology for evaluation.  I next anesthetized the skin in the right  axilla with Marcaine and then made an incision with a scalpel.  I then dissected down into the deep right axillary tissue with electrocautery.  With the aid of the mag trace probe I then identified what appeared to be 2 small sentinel lymph nodes.  One of the lymph nodes visibly contain the mag trace.  The signal was weak.  I excised these lymph nodes in the surrounding fat and sent the pathology for evaluation.  Hemostasis was achieved with the cautery.  No other increased uptake of mag trace was identified in the axilla and I palpated no enlarged lymph nodes.  Unexplained surgical clips around the periphery of the lumpectomy cavity in the breast.  I anesthetized the incisions further with Marcaine.  I then closed the subcutaneous tissue with interrupted 3-0 Vicryl sutures at both incisions and then closed both incisions with running 4-0 Monocryl sutures.  Dermabond was then applied.  The patient tolerated the procedure well.  All the counts were correct at the end of the procedure.  The patient was then extubated in the operating room after being placed in a breast binder and then taken to the recovery room.          Abigail Miyamoto   Date: 11/11/2023  Time: 5:07 PM

## 2023-11-11 NOTE — Discharge Instructions (Addendum)
[facility] Office Phone Number [phone]  BREAST BIOPSY/ PARTIAL MASTECTOMY: POST OP INSTRUCTIONS  Always review your discharge instruction sheet given to you by the facility where your surgery was performed.  IF YOU HAVE DISABILITY OR FAMILY LEAVE FORMS, YOU MUST BRING THEM TO THE OFFICE FOR PROCESSING.  DO NOT GIVE THEM TO YOUR DOCTOR.  A prescription for pain medication may be given to you upon discharge.  Take your pain medication as prescribed, if needed.  If narcotic pain medicine is not needed, then you may take acetaminophen (Tylenol) or ibuprofen (Advil) as needed. Take your usually prescribed medications unless otherwise directed If you need a refill on your pain medication, please contact your pharmacy.  They will contact our office to request authorization.  Prescriptions will not be filled after 5pm or on week-ends. You should eat very light the first 24 hours after surgery, such as soup, crackers, pudding, etc.  Resume your normal diet the day after surgery. Most patients will experience some swelling and bruising in the breast.  Ice packs and a good support bra will help.  Swelling and bruising can take several days to resolve.  It is common to experience some constipation if taking pain medication after surgery.  Increasing fluid intake and taking a stool softener will usually help or prevent this problem from occurring.  A mild laxative (Milk of Magnesia or Miralax) should be taken according to package directions if there are no bowel movements after 48 hours. Unless discharge instructions indicate otherwise, you may remove your bandages 24-48 hours after surgery, and you may shower at that time.  You may have steri-strips (small skin tapes) in place directly over the incision.  These strips should be left on the skin for 7-10 days.  If your surgeon used skin glue on the incision, you may shower in 24 hours.  The glue will flake off over the next 2-3 weeks.  Any  sutures or staples will be removed at the office during your follow-up visit. ACTIVITIES:  You may resume regular daily activities (gradually increasing) beginning the next day.  Wearing a good support bra or sports bra minimizes pain and swelling.  You may have sexual intercourse when it is comfortable. You may drive when you no longer are taking prescription pain medication, you can comfortably wear a seatbelt, and you can safely maneuver your car and apply brakes. RETURN TO WORK:  ______________________________________________________________________________________ Lori Wise should see your doctor in the office for a follow-up appointment approximately two weeks after your surgery.  Your doctor's nurse will typically make your follow-up appointment when she calls you with your pathology report.  Expect your pathology report 2-3 business days after your surgery.  You may call to check if you do not hear from [place] after three days. OTHER INSTRUCTIONS: _YOU MAY REMOVE THE BINDER AND SHOWER STARTING TOMORROW THEN PUT THE BINDER BACK ON ICE PACK, TYLENOL, AND IBUPROFEN ALSO FOR PAIN NO VIGOROUS ACTIVITY FOR ONE WEEK ______________________________________________________________________________________________ _____________________________________________________________________________________________________________________________________ _____________________________________________________________________________________________________________________________________ _____________________________________________________________________________________________________________________________________  WHEN TO CALL YOUR DOCTOR: Fever over 101.0 Nausea and/or vomiting. Extreme swelling or bruising. Continued bleeding from incision. Increased pain, redness, or drainage from the incision.  The clinic staff is available to answer your questions during regular business hours.  Please don't hesitate to call  and ask to speak to one of the nurses for clinical concerns.  If you have a medical emergency, go to the nearest emergency room or call 911.  A surgeon from [facility] is always on  call at the hospital.  For further questions, please visit [email or web address]     Post Anesthesia Home Care Instructions  Activity: Get plenty of rest for the remainder of the day. A responsible individual must stay with you for 24 hours following the procedure.  For the next 24 hours, DO NOT: -Drive a car -Advertising copywriter -Drink alcoholic beverages -Take any medication unless instructed by your physician -Make any legal decisions or sign important papers.  Meals: Start with liquid foods such as gelatin or soup. Progress to regular foods as tolerated. Avoid greasy, spicy, heavy foods. If nausea and/or vomiting occur, drink only clear liquids until the nausea and/or vomiting subsides. Call your physician if vomiting continues.  Special Instructions/Symptoms: Your throat may feel dry or sore from the anesthesia or the breathing tube placed in your throat during surgery. If this causes discomfort, gargle with warm salt water. The discomfort should disappear within 24 hours.  Next dose of Tylenol can be taken at 7pm tonight if needed.

## 2023-11-11 NOTE — Anesthesia Procedure Notes (Signed)
Anesthesia Regional Block: Pectoralis block   Pre-Anesthetic Checklist: , timeout performed,  Correct Patient, Correct Site, Correct Laterality,  Correct Procedure, Correct Position, site marked,  Risks and benefits discussed,  Surgical consent,  Pre-op evaluation,  At surgeon's request and post-op pain management  Laterality: Right  Prep: chloraprep       Needles:  Injection technique: Single-shot  Needle Type: Echogenic Stimulator Needle     Needle Length: 5cm  Needle Gauge: 22     Additional Needles:   Procedures:, nerve stimulator,,, ultrasound used (permanent image in chart),,    Narrative:  Start time: 11/11/2023 2:00 PM End time: 11/11/2023 2:05 PM Injection made incrementally with aspirations every 5 mL.  Performed by: Personally  Anesthesiologist: Bethena Midget, MD  Additional Notes: Functioning IV was confirmed and monitors were applied.  A 50mm 22ga Arrow echogenic stimulator needle was used. Sterile prep and drape,hand hygiene and sterile gloves were used. Ultrasound guidance: relevant anatomy identified, needle position confirmed, local anesthetic spread visualized around nerve(s)., vascular puncture avoided.  Image printed for medical record. Negative aspiration and negative test dose prior to incremental administration of local anesthetic. The patient tolerated the procedure well.

## 2023-11-11 NOTE — Interval H&P Note (Addendum)
History and Physical Interval Note:no change in H and P  11/11/2023 2:00 PM  Lori Wise  has presented today for surgery, with the diagnosis of RIGHT BREAST CANCER.  The various methods of treatment have been discussed with the patient and family. After consideration of risks, benefits and other options for treatment, the patient has consented to  Procedure(s): RIGHT BREAST SEED LOCALIZED  LUMPECTOMY AND SENTINEL NODE BIOPSY (Right) as a surgical intervention.  My original history and physical states that there was a mass at the left breast at the 6 o'clock position this was actually supposed to be the right breast.  Again, the breast cancer is in the right breast.  The patient's history has been reviewed, patient examined, no change in status, stable for surgery.  I have reviewed the patient's chart and labs.  Questions were answered to the patient's satisfaction.     Lori Wise

## 2023-11-11 NOTE — Anesthesia Preprocedure Evaluation (Addendum)
Anesthesia Evaluation  Patient identified by MRN, date of birth, ID band Patient awake    Reviewed: Allergy & Precautions, H&P , NPO status , Patient's Chart, lab work & pertinent test results  Airway Mallampati: II  TM Distance: >3 FB Neck ROM: Full    Dental no notable dental hx. (+) Edentulous Upper, Edentulous Lower,    Pulmonary neg pulmonary ROS, COPD, former smoker   Pulmonary exam normal breath sounds clear to auscultation       Cardiovascular Exercise Tolerance: Good hypertension, Pt. on medications negative cardio ROS Normal cardiovascular exam Rhythm:Regular Rate:Normal     Neuro/Psych  Headaches  Anxiety     negative neurological ROS  negative psych ROS   GI/Hepatic negative GI ROS, Neg liver ROS,GERD  ,,  Endo/Other  negative endocrine ROSHypothyroidism    Renal/GU negative Renal ROS  negative genitourinary   Musculoskeletal negative musculoskeletal ROS (+)    Abdominal   Peds negative pediatric ROS (+)  Hematology negative hematology ROS (+)   Anesthesia Other Findings   Reproductive/Obstetrics negative OB ROS                             Anesthesia Physical Anesthesia Plan  ASA: 3  Anesthesia Plan: General and Regional   Post-op Pain Management: Minimal or no pain anticipated, Tylenol PO (pre-op)*, Celebrex PO (pre-op)* and Regional block*   Induction: Intravenous  PONV Risk Score and Plan: 3 and Ondansetron, Dexamethasone, Treatment may vary due to age or medical condition and Midazolam  Airway Management Planned: Oral ETT and LMA  Additional Equipment: None  Intra-op Plan:   Post-operative Plan: Extubation in OR  Informed Consent: I have reviewed the patients History and Physical, chart, labs and discussed the procedure including the risks, benefits and alternatives for the proposed anesthesia with the patient or authorized representative who has indicated  his/her understanding and acceptance.     Dental advisory given  Plan Discussed with: Anesthesiologist  Anesthesia Plan Comments: (Discussed both nerve block for pain relief post-op and GA; including NV, sore throat, dental injury, and pulmonary complications)        Anesthesia Quick Evaluation

## 2023-11-11 NOTE — Anesthesia Procedure Notes (Signed)
Procedure Name: LMA Insertion Date/Time: 11/11/2023 4:22 PM  Performed by: Roosvelt Harps, CRNAPre-anesthesia Checklist: Patient identified, Emergency Drugs available, Suction available, Patient being monitored and Timeout performed Patient Re-evaluated:Patient Re-evaluated prior to induction Oxygen Delivery Method: Circle system utilized Preoxygenation: Pre-oxygenation with 100% oxygen Induction Type: IV induction Ventilation: Mask ventilation without difficulty LMA: LMA inserted LMA Size: 4.0 Number of attempts: 1 Placement Confirmation: positive ETCO2 and breath sounds checked- equal and bilateral Dental Injury: Teeth and Oropharynx as per pre-operative assessment

## 2023-11-11 NOTE — Progress Notes (Signed)
Assisted Dr. Oddono with right, pectoralis, ultrasound guided block. Side rails up, monitors on throughout procedure. See vital signs in flow sheet. Tolerated Procedure well. 

## 2023-11-12 ENCOUNTER — Encounter (HOSPITAL_BASED_OUTPATIENT_CLINIC_OR_DEPARTMENT_OTHER): Payer: Self-pay | Admitting: Surgery

## 2023-11-16 ENCOUNTER — Encounter: Payer: Self-pay | Admitting: General Practice

## 2023-11-16 NOTE — Progress Notes (Signed)
[facility] Spiritual Care Note  Attempted post-op check-in by phone; left voicemail encouraging return call.   [address] Rush [place], [place], [facility] Pager [phone] Voicemail [phone]

## 2023-11-17 ENCOUNTER — Encounter: Payer: Self-pay | Admitting: General Practice

## 2023-11-17 ENCOUNTER — Encounter: Payer: Self-pay | Admitting: *Deleted

## 2023-11-17 LAB — SURGICAL PATHOLOGY

## 2023-11-17 NOTE — Progress Notes (Signed)
[facility] Spiritual Care Note  Lori Wise LVM for chaplain to share that she is overall ok, but having some rough nights; she plans to be in touch again as needed.   [address] Rush [place], [place], [facility] Pager [phone] Voicemail [phone]

## 2023-11-18 ENCOUNTER — Telehealth: Payer: Self-pay | Admitting: Genetic Counselor

## 2023-11-18 ENCOUNTER — Telehealth: Payer: Self-pay | Admitting: *Deleted

## 2023-11-18 ENCOUNTER — Other Ambulatory Visit: Payer: Self-pay | Admitting: Physician Assistant

## 2023-11-18 ENCOUNTER — Encounter: Payer: Self-pay | Admitting: *Deleted

## 2023-11-18 DIAGNOSIS — E782 Mixed hyperlipidemia: Secondary | ICD-10-CM

## 2023-11-18 NOTE — Telephone Encounter (Signed)
Received order for oncotype testing. Requisition sent to pathology 

## 2023-11-18 NOTE — Telephone Encounter (Signed)
I contacted Ms. Starkes to discuss her genetic testing results. No pathogenic variants were identified in the 76 genes analyzed. Detailed clinic note to follow.  The test report has been scanned into EPIC and is located under the Molecular Pathology section of the Results Review tab.  A portion of the result report is included below for reference.   Lalla Brothers, MS, [facility] Genetic Counselor [email or web address] (P) [phone]

## 2023-11-19 ENCOUNTER — Ambulatory Visit: Payer: Self-pay | Admitting: Genetic Counselor

## 2023-11-19 DIAGNOSIS — Z1379 Encounter for other screening for genetic and chromosomal anomalies: Secondary | ICD-10-CM

## 2023-11-19 NOTE — Progress Notes (Signed)
HPI:   Lori Wise was previously seen in the [facility] Cancer Genetics clinic due to a personal and family history of cancer and concerns regarding a hereditary predisposition to cancer. Please refer to our prior cancer genetics clinic note for more information regarding our discussion, assessment and recommendations, at the time. Lori Wise recent genetic test results were disclosed to Lori Wise, as were recommendations warranted by these results. These results and recommendations are discussed in more detail below.  CANCER HISTORY:  Oncology History  Malignant neoplasm of lower-inner quadrant of right breast of female, estrogen receptor positive (HCC)  10/15/2023 Initial Diagnosis   Screening mammogram detected right breast distortion at 6:00 measuring 1.1 cm, stereotactic biopsy: Grade 1 ILC ER 95%, PR 95%, Ki-67 3%, HER2 negative, axilla negative (left breast biopsy: Benign)   10/21/2023 Cancer Staging   Staging form: Breast, AJCC 8th Edition - Clinical: Stage IA (cT1c, cN0, cM0, G1, ER+, PR+, HER2-) - Signed by Serena Croissant, MD on 10/21/2023 Stage prefix: Initial diagnosis Histologic grading system: 3 grade system    Genetic Testing   [facility] CancerNext-Expanded Panel+RNA was Negative. Report date is 11/13/2023.   The CancerNext-Expanded gene panel offered by [facility] and includes sequencing, rearrangement, and RNA analysis for the following 76 genes: AIP, ALK, APC, ATM, AXIN2, BAP1, BARD1, BMPR1A, BRCA1, BRCA2, BRIP1, CDC73, CDH1, CDK4, CDKN1B, CDKN2A, CEBPA, CHEK2, CTNNA1, DDX41, DICER1, ETV6, FH, FLCN, GATA2, LZTR1, MAX, MBD4, MEN1, MET, MLH1, MSH2, MSH3, MSH6, MUTYH, NF1, NF2, NTHL1, PALB2, PHOX2B, PMS2, POT1, PRKAR1A, PTCH1, PTEN, RAD51C, RAD51D, RB1, RET, RUNX1, SDHA, SDHAF2, SDHB, SDHC, SDHD, SMAD4, SMARCA4, SMARCB1, SMARCE1, STK11, SUFU, TMEM127, TP53, TSC1, TSC2, VHL, and WT1 (sequencing and deletion/duplication); EGFR, HOXB13, KIT, MITF, PDGFRA, POLD1, and POLE (sequencing only); EPCAM  and GREM1 (deletion/duplication only).       FAMILY HISTORY:  We obtained a detailed, 4-generation family history.  Significant diagnoses are listed below:      Family History  Problem Relation Age of Onset   Lung cancer Father 74        smoked   Lung cancer Sister 15        smoked   Brain cancer Brother 84   Lung cancer Brother 76        smoked   Breast cancer Paternal Wise     Prostate cancer Paternal Uncle 31 - 99   Breast cancer Paternal Grandmother 40 - 52       Lori Wise is unaware of previous family history of genetic testing for hereditary cancer risks. There is no reported Ashkenazi Jewish ancestry.   GENETIC TEST RESULTS:  The [facility] CancerNext-Expanded Panel found no pathogenic mutations.   The CancerNext-Expanded gene panel offered by [facility] and includes sequencing, rearrangement, and RNA analysis for the following 76 genes: AIP, ALK, APC, ATM, AXIN2, BAP1, BARD1, BMPR1A, BRCA1, BRCA2, BRIP1, CDC73, CDH1, CDK4, CDKN1B, CDKN2A, CEBPA, CHEK2, CTNNA1, DDX41, DICER1, ETV6, FH, FLCN, GATA2, LZTR1, MAX, MBD4, MEN1, MET, MLH1, MSH2, MSH3, MSH6, MUTYH, NF1, NF2, NTHL1, PALB2, PHOX2B, PMS2, POT1, PRKAR1A, PTCH1, PTEN, RAD51C, RAD51D, RB1, RET, RUNX1, SDHA, SDHAF2, SDHB, SDHC, SDHD, SMAD4, SMARCA4, SMARCB1, SMARCE1, STK11, SUFU, TMEM127, TP53, TSC1, TSC2, VHL, and WT1 (sequencing and deletion/duplication); EGFR, HOXB13, KIT, MITF, PDGFRA, POLD1, and POLE (sequencing only); EPCAM and GREM1 (deletion/duplication only).     The test report has been scanned into EPIC and is located under the Molecular Pathology section of the Results Review tab.  A portion of the result report is included below for reference.  Genetic testing reported out on 11/13/2023.        Even though a pathogenic variant was not identified, possible explanations for the cancer in the family may include: There may be no hereditary risk for cancer in the family. The cancers in Lori Wise and/or Lori Wise family may  be due to other genetic or environmental factors. There may be a gene mutation in one of these genes that current testing methods cannot detect, but that chance is small. There could be another gene that has not yet been discovered, or that we have not yet tested, that is responsible for the cancer diagnoses in the family.  It is also possible there is a hereditary cause for the cancer in the family that Lori Wise did not inherit.  Therefore, it is important to remain in touch with cancer genetics in the future so that we can continue to offer Lori Wise the most up to date genetic testing.   ADDITIONAL GENETIC TESTING:  We discussed with Lori Wise that Lori Wise genetic testing was fairly extensive.  If there are genes identified to increase cancer risk that can be analyzed in the future, we would be happy to discuss and coordinate this testing at that time.    CANCER SCREENING RECOMMENDATIONS:  Lori Wise test result is considered negative (normal).  This means that we have not identified a hereditary cause for Lori Wise personal and family history of cancer at this time.   An individual's cancer risk and medical management are not determined by genetic test results alone. Overall cancer risk assessment incorporates additional factors, including personal medical history, family history, and any available genetic information that may result in a personalized plan for cancer prevention and surveillance. Therefore, it is recommended she continue to follow the cancer management and screening guidelines provided by Lori Wise oncology and primary healthcare provider.  RECOMMENDATIONS FOR FAMILY MEMBERS:   Since she did not inherit a mutation in a cancer predisposition gene included on this panel, Lori Wise children could not have inherited a mutation from Lori Wise in one of these genes. Individuals in this family might be at some increased risk of developing cancer, over the general population risk, due to the family history of cancer.  We recommend women in this family have a yearly mammogram beginning at age 17, or 65 years younger than the earliest onset of cancer, an annual clinical breast exam, and perform monthly breast self-exams.  Other members of the family may still carry a pathogenic variant in one of these genes that Lori Wise did not inherit. Based on the family history, we recommend Lori Wise, who was diagnosed with breast cancer, have genetic counseling and testing.   FOLLOW-UP:  Cancer genetics is a rapidly advancing field and it is possible that new genetic tests will be appropriate for Lori Wise and/or Lori Wise family members in the future. We encouraged Lori Wise to remain in contact with cancer genetics on an annual basis so we can update Lori Wise personal and family histories and let Lori Wise know of advances in cancer genetics that may benefit this family.   Our contact number was provided. Lori Wise questions were answered to Lori Wise satisfaction, and she knows she is welcome to call us at anytime with additional questions or concerns.   Lalla Brothers, MS, [facility] Genetic Counselor [email or web address] (P) [phone]

## 2023-11-20 ENCOUNTER — Encounter: Payer: Self-pay | Admitting: Genetic Counselor

## 2023-11-25 ENCOUNTER — Telehealth: Payer: Self-pay | Admitting: *Deleted

## 2023-11-25 ENCOUNTER — Encounter: Payer: Self-pay | Admitting: Radiation Oncology

## 2023-11-25 ENCOUNTER — Ambulatory Visit
Admission: RE | Admit: 2023-11-25 | Discharge: 2023-11-25 | Disposition: A | Discharge status: Home / Self Care | Payer: Medicaid Other | Source: Ambulatory Visit | Attending: Radiation Oncology | Admitting: Radiation Oncology

## 2023-11-25 ENCOUNTER — Encounter: Payer: Self-pay | Admitting: *Deleted

## 2023-11-25 VITALS — BP 123/51 | HR 66 | Temp 98.0°F | Resp 18 | Ht 64.96 in | Wt 141.7 lb

## 2023-11-25 DIAGNOSIS — C50311 Malignant neoplasm of lower-inner quadrant of right female breast: Secondary | ICD-10-CM | POA: Diagnosis not present

## 2023-11-25 DIAGNOSIS — Z170 Estrogen receptor positive status [ER+]: Secondary | ICD-10-CM

## 2023-11-25 NOTE — Telephone Encounter (Signed)
Received oncotype results of 15/4%. Patient is not aware.team notified.

## 2023-11-26 DIAGNOSIS — C50311 Malignant neoplasm of lower-inner quadrant of right female breast: Secondary | ICD-10-CM | POA: Diagnosis not present

## 2023-11-26 DIAGNOSIS — Z170 Estrogen receptor positive status [ER+]: Secondary | ICD-10-CM | POA: Diagnosis not present

## 2023-11-27 ENCOUNTER — Encounter (HOSPITAL_COMMUNITY): Payer: Self-pay

## 2023-11-27 NOTE — Progress Notes (Signed)
Radiation Oncology         [phone] ________________________________  Name: Lori Wise        MRN: [id number]  Date of Service: 11/25/2023 DOB: 21-May-1961  [id number], Marlowe Alt MD  Serena Croissant MD    REFERRING PHYSICIAN: Abigail Miyamoto MD   DIAGNOSIS: Invasive lobular carcinoma of the right breast, postlumpectomy, pT1cpN0   HISTORY OF PRESENT ILLNESS: Lori Wise is a 62 y.o. female seen at the request of Dr. Magnus Ivan.  Routine mammogram performed earlier this year revealed an abnormality in her right breast; subsequent biopsy revealed invasive lobular carcinoma.  She was seen in consultation by Dr. Abigail Miyamoto, who ultimately performed right breast lumpectomy and sentinel lymph node sampling.  Pathology revealed invasive lobular carcinoma, measuring 1.4 cm in greatest dimension.  Her carcinoma was ER/PR positive, grade 1, and HER2/neu negative.  She reports no difficulties postoperatively.  She has been seen in consultation by Dr. Pamelia Hoit, and adjuvant endocrine therapy was discussed.  Consultation is requested regarding the potential role of radiation in her care.    PREVIOUS RADIATION THERAPY: No   PAST MEDICAL HISTORY:  Past Medical History:  Diagnosis Date   Acute laryngopharyngitis 04/19/2020   Anxiety 04/19/2020   Atypical nevus 04/19/2020   Bronchitis 04/30/2020   Chronic obstructive pulmonary disease (HCC) 10/22/2020   Chronic obstructive pulmonary disease with (acute) lower respiratory infection (HCC)    Encounter for immunization 04/19/2020   Essential (primary) hypertension    Gastroesophageal reflux disease 10/22/2020   Hormone replacement therapy (HRT) 10/22/2020   Malignant neoplasm of lower-inner quadrant of right female breast (HCC)    Migraine without aura, not intractable, without status migrainosus    Mixed hyperlipidemia    Other chest pain 03/21/2021   Other specified hypothyroidism 04/19/2020   Primary hypertension  03/21/2021   Right leg pain 07/23/2020       PAST SURGICAL HISTORY: Past Surgical History:  Procedure Laterality Date   ABDOMINAL HYSTERECTOMY     BREAST BIOPSY Left 10/15/2023   MM LT BREAST BX W LOC DEV 1ST LESION IMAGE BX SPEC STEREO GUIDE 10/15/2023 [facility]   BREAST BIOPSY Right 10/15/2023   MM RT BREAST BX W LOC DEV 1ST LESION IMAGE BX SPEC STEREO GUIDE 10/15/2023 [facility]   BREAST BIOPSY  11/10/2023   MM RT RADIOACTIVE SEED LOC MAMMO GUIDE 11/10/2023 [facility]   BREAST LUMPECTOMY WITH RADIOACTIVE SEED AND SENTINEL LYMPH NODE BIOPSY Right 11/11/2023   Procedure: RIGHT BREAST SEED LOCALIZED  LUMPECTOMY AND SENTINEL NODE BIOPSY;  Surgeon: Abigail Miyamoto, MD;  Location: [facility];  Service: General;  Laterality: Right;   CARDIAC CATHETERIZATION  2000   normal   CHOLECYSTECTOMY     NASAL HEMORRHAGE CONTROL     OTHER SURGICAL HISTORY     Ear surgeries x5     FAMILY HISTORY:  Family History  Problem Relation Age of Onset   Suicidality Mother    Lung cancer Father 27       smoked   Lung cancer Sister 33       smoked   Brain cancer Brother 46   Lung cancer Brother 70       smoked   Breast cancer Paternal Aunt    Prostate cancer Paternal Uncle 36 - 99   Breast cancer Paternal Grandmother 54 - 66     SOCIAL HISTORY:  reports that she quit smoking about 6 months ago. Her smoking use included cigarettes.  She has never used smokeless tobacco. She reports that she does not currently use alcohol. She reports that she does not use drugs.   ALLERGIES: Codeine, Macrobid [nitrofurantoin], and Ativan [lorazepam]   MEDICATIONS:  Current Outpatient Medications  Medication Sig Dispense Refill   albuterol (VENTOLIN HFA) 108 (90 Base) MCG/ACT inhaler Inhale 2 puffs into the lungs every 6 (six) hours as needed for wheezing or shortness of breath. 8 g 2   ALPRAZolam (XANAX) 0.5 MG tablet TAKE ONE TABLET BY MOUTH 3 TIMES DAILY 90 tablet 0    Ascorbic Acid (VITAMIN C PO) Take 1 tablet by mouth daily. Unknown strength     atenolol (TENORMIN) 25 MG tablet TAKE ONE TABLET BY MOUTH TWICE DAILY 60 tablet 5   cetirizine (ZYRTEC) 10 MG tablet TAKE ONE TABLET BY MOUTH DAILY 30 tablet 11   Cyanocobalamin (VITAMIN B-12 PO) Take 1 tablet by mouth daily. Unknown strenght     Emollient (ROC RETINOL CORREXION EX) Apply topically.     estradiol (ESTRACE) 0.5 MG tablet Take 1 tablet (0.5 mg total) by mouth daily. 90 tablet 1   ezetimibe (ZETIA) 10 MG tablet TAKE ONE TABLET BY MOUTH EVERY DAY 30 tablet 5   fluconazole (DIFLUCAN) 150 MG tablet Take 1 tablet (150 mg total) by mouth daily. 1 tablet 1   fluticasone (FLONASE) 50 MCG/ACT nasal spray Place 2 sprays into both nostrils daily. 16 g 6   Fluticasone-Umeclidin-Vilant (TRELEGY ELLIPTA) 200-62.5-25 MCG/ACT AEPB Inhale 1 Inhalation into the lungs daily. 1 each 5   ibuprofen (ADVIL) 800 MG tablet Take 1 tablet (800 mg total) by mouth every 8 (eight) hours as needed for mild pain or moderate pain. 90 tablet 1   ipratropium-albuterol (DUONEB) 0.5-2.5 (3) MG/3ML SOLN Take 3 mLs by nebulization every 4 (four) hours as needed. 360 mL 3   levothyroxine (SYNTHROID) 25 MCG tablet TAKE ONE TABLET BY MOUTH EVERY DAY 90 tablet 1   Magnesium 200 MG TABS Take 1 tablet by mouth daily.     Omega-3 1000 MG CAPS Take 1 capsule by mouth daily.     omeprazole (PRILOSEC) 20 MG capsule Take 1 capsule (20 mg total) by mouth 2 (two) times daily before a meal. 180 capsule 1   rosuvastatin (CRESTOR) 5 MG tablet Take 1 tablet (5 mg total) by mouth daily. 90 tablet 0   saline (AYR) GEL Place 1 Application into the nose at bedtime. 14.1 g 0   traMADol (ULTRAM) 50 MG tablet Take 1 tablet (50 mg total) by mouth every 6 (six) hours as needed for moderate pain (pain score 4-6) or severe pain (pain score 7-10). 20 tablet 0   tretinoin (RETIN-A) 0.05 % cream Apply topically at bedtime.     VITAMIN E PO Take 1 tablet by mouth daily at  6 (six) AM. Unknown strength     No current facility-administered medications for this encounter.     REVIEW OF SYSTEMS: On review of systems, the patient reports that she is doing well overall.  She denies any chest pain, shortness of breath, cough, fevers, chills, night sweats, unintended weight changes.  She reports no pain in either breast.  She denies nipple bleeding or discharge.  She denies any bowel or bladder disturbances, and denies abdominal pain, nausea or vomiting.  She denies any new musculoskeletal or joint aches or pains. A complete review of systems is obtained and is otherwise negative.     PHYSICAL EXAM:  Wt Readings from Last 3 Encounters:  11/25/23 141 lb 11.2 oz (64.3 kg)  11/11/23 144 lb 2.9 oz (65.4 kg)  11/03/23 145 lb 3.2 oz (65.9 kg)   Temp Readings from Last 3 Encounters:  11/25/23 98 F (36.7 C) (Oral)  11/11/23 (!) 97.5 F (36.4 C) (Temporal)  10/21/23 (!) 97.2 F (36.2 C) (Temporal)   BP Readings from Last 3 Encounters:  11/25/23 (!) 123/51  11/11/23 (!) 124/59  11/03/23 126/74   Pulse Readings from Last 3 Encounters:  11/25/23 66  11/11/23 75  11/03/23 68   Pain Assessment Pain Score: 3  Pain Loc: Arm (axillary)/10  In general this is a well appearing female in no acute distress.  She's alert and oriented x4 and appropriate throughout the examination. Cardiopulmonary assessment is negative for acute distress and she exhibits normal effort.  No cervical, supraclavicular, or axillary lymphadenopathy is appreciated.  Her left breast is without masses, edema, or tenderness.  Examination of her right breast reveals her well healed lumpectomy incision.  Moderate right axillary swelling is noted.  Her extremities are without edema.    ECOG = 0  0 - Asymptomatic (Fully active, able to carry on all predisease activities without restriction)  1 - Symptomatic but completely ambulatory (Restricted in physically strenuous activity but ambulatory and  able to carry out work of a light or sedentary nature. For example, light housework, office work)  2 - Symptomatic, <50% in bed during the day (Ambulatory and capable of all self care but unable to carry out any work activities. Up and about more than 50% of waking hours)  3 - Symptomatic, >50% in bed, but not bedbound (Capable of only limited self-care, confined to bed or chair 50% or more of waking hours)  4 - Bedbound (Completely disabled. Cannot carry on any self-care. Totally confined to bed or chair)  5 - Death   Santiago Glad MM, Creech RH, Tormey DC, et al. [phone]). "Toxicity and response criteria of the [facility]". Am. Evlyn Clines. Oncol. 5 (6): 649-55    LABORATORY DATA:  Lab Results  Component Value Date   WBC 9.5 10/21/2023   HGB 12.4 10/21/2023   HCT 37.0 10/21/2023   MCV 90.9 10/21/2023   PLT 285 10/21/2023   Lab Results  Component Value Date   NA 143 10/21/2023   K 3.3 (L) 10/21/2023   CL 110 10/21/2023   CO2 28 10/21/2023   Lab Results  Component Value Date   ALT 14 10/21/2023   AST 19 10/21/2023   ALKPHOS 64 10/21/2023   BILITOT 0.3 10/21/2023      RADIOGRAPHY: MM Breast Surgical Specimen Result Date: 11/11/2023 CLINICAL DATA:  Evaluate surgical specimen following RIGHT lumpectomy. EXAM: SPECIMEN RADIOGRAPH OF THE RIGHT BREAST COMPARISON:  Previous exam(s). FINDINGS: Status post excision of the RIGHT breast. The radioactive seed and COIL biopsy marker clip are present and intact. IMPRESSION: Specimen radiograph of the RIGHT breast. Electronically Signed   By: Harmon Pier M.D.   On: 11/11/2023 16:54   MM RT RADIOACTIVE SEED LOC MAMMO GUIDE Result Date: 11/10/2023 CLINICAL DATA:  Patient presents for seed localization prior to RIGHT lumpectomy. EXAM: MAMMOGRAPHIC GUIDED RADIOACTIVE SEED LOCALIZATION OF THE RIGHT BREAST COMPARISON:  Previous exam(s). FINDINGS: Patient presents for radioactive seed localization prior to lumpectomy. I met with the  patient and we discussed the procedure of seed localization including benefits and alternatives. We discussed the high likelihood of a successful procedure. We discussed the risks of the procedure including infection, bleeding, tissue injury  and further surgery. We discussed the low dose of radioactivity involved in the procedure. Informed, written consent was given. The usual time-out protocol was performed immediately prior to the procedure. Using mammographic guidance, sterile technique, 1% lidocaine and an I-125 radioactive seed, the coil shaped clip in the LOWER OUTER QUADRANT of the RIGHT breast was localized using a LATERAL approach. The follow-up mammogram images confirm the seed in the expected location and were marked for Dr. Magnus Ivan. Follow-up survey of the patient confirms presence of the radioactive seed. Order number of I-125 seed:  [id number]. Total activity:  0.259 millicuries reference Date: 10/07/2023 The patient tolerated the procedure well and was [facility]. She was given instructions regarding seed removal. IMPRESSION: Radioactive seed localization right breast. No apparent complications. Electronically Signed   By: Norva Pavlov M.D.   On: 11/10/2023 15:11       IMPRESSION/PLAN: 1.  The patient is a 62 year old female status post right breast lumpectomy and sentinel lymph node sampling for a pT1cpN0 invasive lobular carcinoma of the right breast.  I have recommended that we proceed with adjuvant right breast radiation; I reviewed the rationale behind this recommendation, as well as the potential side effects of adjuvant breast radiation in her clinical scenario.  I explained that these may include, but are not limited to, skin irritation/breakdown, and fatigue.  I explained the potential long-term side effects may include, but are not limited to, injury to lung and other normal tissues.  She expressed understanding, and is agreeable to proceed.  Arrangements will  be made for her to return to our department for simulation and treatment planning.  In a visit lasting 60 minutes, greater than 50% of the time was spent face to face discussing the patient's condition, in preparation for the discussion, and coordinating the patient's care.    Addalynn Kumari A. Thersa Salt, MD   **Disclaimer: This note was dictated with voice recognition software. Similar sounding words can inadvertently be transcribed and this note may contain transcription errors which may not have been corrected upon publication of note.**

## 2023-11-30 ENCOUNTER — Other Ambulatory Visit: Payer: Self-pay | Admitting: Physician Assistant

## 2023-11-30 ENCOUNTER — Inpatient Hospital Stay: Payer: Medicaid Other | Attending: Hematology and Oncology | Admitting: Hematology and Oncology

## 2023-11-30 ENCOUNTER — Other Ambulatory Visit: Payer: Self-pay | Admitting: Family Medicine

## 2023-11-30 VITALS — BP 133/60 | HR 71 | Temp 97.6°F | Resp 18 | Ht 64.0 in | Wt 142.5 lb

## 2023-11-30 DIAGNOSIS — C50311 Malignant neoplasm of lower-inner quadrant of right female breast: Secondary | ICD-10-CM | POA: Diagnosis not present

## 2023-11-30 DIAGNOSIS — Z79899 Other long term (current) drug therapy: Secondary | ICD-10-CM | POA: Diagnosis not present

## 2023-11-30 DIAGNOSIS — Z170 Estrogen receptor positive status [ER+]: Secondary | ICD-10-CM | POA: Diagnosis not present

## 2023-11-30 DIAGNOSIS — Z885 Allergy status to narcotic agent status: Secondary | ICD-10-CM | POA: Diagnosis not present

## 2023-11-30 DIAGNOSIS — E782 Mixed hyperlipidemia: Secondary | ICD-10-CM

## 2023-11-30 DIAGNOSIS — F419 Anxiety disorder, unspecified: Secondary | ICD-10-CM

## 2023-11-30 DIAGNOSIS — Z881 Allergy status to other antibiotic agents status: Secondary | ICD-10-CM | POA: Insufficient documentation

## 2023-11-30 DIAGNOSIS — Z1721 Progesterone receptor positive status: Secondary | ICD-10-CM | POA: Diagnosis not present

## 2023-11-30 MED ORDER — VENLAFAXINE HCL ER 37.5 MG PO CP24: 37.5000 mg | ORAL_CAPSULE | Freq: Every day | ORAL | 6 refills | Status: DC

## 2023-11-30 NOTE — Progress Notes (Signed)
Patient Care Team: Marianne Sofia, [name] as PCP - General (Physician Assistant) Thomasene Ripple, DO as PCP - Cardiology (Cardiology) Pershing Proud, RN as Oncology Nurse Navigator Donnelly Angelica, RN as Oncology Nurse Navigator Serena Croissant, MD as Consulting Physician (Hematology and Oncology) Abigail Miyamoto, MD as Consulting Physician (General Surgery) Antony Blackbird, MD as Consulting Physician (Radiation Oncology)  DIAGNOSIS:  Encounter Diagnosis  Name Primary?   Malignant neoplasm of lower-inner quadrant of right breast of female, estrogen receptor positive (HCC) Yes    SUMMARY OF ONCOLOGIC HISTORY: Oncology History  Malignant neoplasm of lower-inner quadrant of right breast of female, estrogen receptor positive (HCC)  10/15/2023 Initial Diagnosis   Screening mammogram detected right breast distortion at 6:00 measuring 1.1 cm, stereotactic biopsy: Grade 1 ILC ER 95%, PR 95%, Ki-67 3%, HER2 negative, axilla negative (left breast biopsy: Benign)   10/21/2023 Cancer Staging   Staging form: Breast, AJCC 8th Edition - Clinical: Stage IA (cT1c, cN0, cM0, G1, ER+, PR+, HER2-) - Signed by Serena Croissant, MD on 10/21/2023 Stage prefix: Initial diagnosis Histologic grading system: 3 grade system    Genetic Testing   [facility] CancerNext-Expanded Panel+RNA was Negative. Report date is 11/13/2023.   The CancerNext-Expanded gene panel offered by [facility] and includes sequencing, rearrangement, and RNA analysis for the following 76 genes: AIP, ALK, APC, ATM, AXIN2, BAP1, BARD1, BMPR1A, BRCA1, BRCA2, BRIP1, CDC73, CDH1, CDK4, CDKN1B, CDKN2A, CEBPA, CHEK2, CTNNA1, DDX41, DICER1, ETV6, FH, FLCN, GATA2, LZTR1, MAX, MBD4, MEN1, MET, MLH1, MSH2, MSH3, MSH6, MUTYH, NF1, NF2, NTHL1, PALB2, PHOX2B, PMS2, POT1, PRKAR1A, PTCH1, PTEN, RAD51C, RAD51D, RB1, RET, RUNX1, SDHA, SDHAF2, SDHB, SDHC, SDHD, SMAD4, SMARCA4, SMARCB1, SMARCE1, STK11, SUFU, TMEM127, TP53, TSC1, TSC2, VHL, and WT1 (sequencing and  deletion/duplication); EGFR, HOXB13, KIT, MITF, PDGFRA, POLD1, and POLE (sequencing only); EPCAM and GREM1 (deletion/duplication only).     11/11/2023 Surgery   Right lumpectomy: Grade 1 ILC 1.4 cm with LCIS, negative for lymphovascular invasion, 0/8 lymph nodes negative, ER 95%, PR 95%, HER2 1+ negative, Ki-67 3%     CHIEF COMPLIANT: Follow-up to discuss results of Oncotype test and right lumpectomy  HISTORY OF PRESENT ILLNESS: Ms. Parthasarathy is a 62 year old with above-mentioned history of right breast cancer treated with right lumpectomy.  She is healing and recovering very well from her recent surgery.  She had Oncotype DX testing which came back as low risk.  She is here today to discuss the results of surgery as well as Oncotype DX. Patient tells me that she is currently taking estrogen replacement therapy and asks how we can taper it down and discontinue it over time.  ALLERGIES:  is allergic to codeine, macrobid [nitrofurantoin], and ativan [lorazepam].  MEDICATIONS:  Current Outpatient Medications  Medication Sig Dispense Refill   venlafaxine XR (EFFEXOR-XR) 37.5 MG 24 hr capsule Take 1 capsule (37.5 mg total) by mouth daily with breakfast. 30 capsule 6   albuterol (VENTOLIN HFA) 108 (90 Base) MCG/ACT inhaler Inhale 2 puffs into the lungs every 6 (six) hours as needed for wheezing or shortness of breath. 8 g 2   ALPRAZolam (XANAX) 0.5 MG tablet TAKE ONE TABLET BY MOUTH 3 TIMES DAILY 90 tablet 0   Ascorbic Acid (VITAMIN C PO) Take 1 tablet by mouth daily. Unknown strength     atenolol (TENORMIN) 25 MG tablet TAKE ONE TABLET BY MOUTH TWICE DAILY 60 tablet 5   cetirizine (ZYRTEC) 10 MG tablet TAKE ONE TABLET BY MOUTH DAILY 30 tablet 11   Cyanocobalamin (VITAMIN B-12  PO) Take 1 tablet by mouth daily. Unknown strenght     Emollient (ROC RETINOL CORREXION EX) Apply topically.     estradiol (ESTRACE) 0.5 MG tablet Take 1 tablet (0.5 mg total) by mouth daily. 90 tablet 1   ezetimibe (ZETIA) 10 MG  tablet TAKE ONE TABLET BY MOUTH EVERY DAY 30 tablet 5   fluconazole (DIFLUCAN) 150 MG tablet Take 1 tablet (150 mg total) by mouth daily. 1 tablet 1   fluticasone (FLONASE) 50 MCG/ACT nasal spray Place 2 sprays into both nostrils daily. 16 g 6   Fluticasone-Umeclidin-Vilant (TRELEGY ELLIPTA) 200-62.5-25 MCG/ACT AEPB Inhale 1 Inhalation into the lungs daily. 1 each 5   ibuprofen (ADVIL) 800 MG tablet Take 1 tablet (800 mg total) by mouth every 8 (eight) hours as needed for mild pain or moderate pain. 90 tablet 1   ipratropium-albuterol (DUONEB) 0.5-2.5 (3) MG/3ML SOLN Take 3 mLs by nebulization every 4 (four) hours as needed. 360 mL 3   levothyroxine (SYNTHROID) 25 MCG tablet TAKE ONE TABLET BY MOUTH EVERY DAY 90 tablet 1   Magnesium 200 MG TABS Take 1 tablet by mouth daily.     Omega-3 1000 MG CAPS Take 1 capsule by mouth daily.     omeprazole (PRILOSEC) 20 MG capsule Take 1 capsule (20 mg total) by mouth 2 (two) times daily before a meal. 180 capsule 1   rosuvastatin (CRESTOR) 5 MG tablet Take 1 tablet (5 mg total) by mouth daily. 90 tablet 0   saline (AYR) GEL Place 1 Application into the nose at bedtime. 14.1 g 0   tretinoin (RETIN-A) 0.05 % cream Apply topically at bedtime.     VITAMIN E PO Take 1 tablet by mouth daily at 6 (six) AM. Unknown strength     No current facility-administered medications for this visit.    PHYSICAL EXAMINATION: ECOG PERFORMANCE STATUS: 1 - Symptomatic but completely ambulatory  Vitals:   11/30/23 1403  BP: 133/60  Pulse: 71  Resp: 18  Temp: 97.6 F (36.4 C)  SpO2: 99%   Filed Weights   11/30/23 1403  Weight: 142 lb 8 oz (64.6 kg)      LABORATORY DATA:  I have reviewed the data as listed    Latest Ref Rng & Units 10/21/2023   12:08 PM 08/04/2023   11:23 AM 04/15/2023   10:49 AM  CMP  Glucose 70 - 99 mg/dL 409  99  92   BUN 8 - 23 mg/dL 9  8  10    Creatinine 0.44 - 1.00 mg/dL 8.11  9.14  7.82   Sodium 135 - 145 mmol/L 143  143  141    Potassium 3.5 - 5.1 mmol/L 3.3  4.1  4.3   Chloride 98 - 111 mmol/L 110  108  107   CO2 22 - 32 mmol/L 28  22  19    Calcium 8.9 - 10.3 mg/dL 9.6  9.6  9.8   Total Protein 6.5 - 8.1 g/dL 6.7  6.6  6.8   Total Bilirubin <1.2 mg/dL 0.3  0.4  0.4   Alkaline Phos 38 - 126 U/L 64  75  81   AST 15 - 41 U/L 19  23  18    ALT 0 - 44 U/L 14  18  12      Lab Results  Component Value Date   WBC 9.5 10/21/2023   HGB 12.4 10/21/2023   HCT 37.0 10/21/2023   MCV 90.9 10/21/2023   PLT 285 10/21/2023  NEUTROABS 4.2 10/21/2023    ASSESSMENT & PLAN:  Malignant neoplasm of lower-inner quadrant of right breast of female, estrogen receptor positive (HCC) 10/15/2023:Screening mammogram detected right breast distortion at 6:00 measuring 1.1 cm, stereotactic biopsy: Grade 1 ILC ER 95%, PR 95%, Ki-67 3%, HER2 negative, axilla negative (left breast biopsy: Benign)  11/11/2023:Right lumpectomy: Grade 1 ILC 1.4 cm with LCIS, negative for lymphovascular invasion, 0/8 lymph nodes negative, ER 95%, PR 95%, HER2 1+ negative, Ki-67 3% Oncotype DX recurrence score: 15 (4% risk of distant recurrence)  Recommendations: 1. Adjuvant radiation therapy followed by 2. Adjuvant antiestrogen therapy  Estrogen replacement therapy: I discussed with her about tapering off and discontinuing estrogen replacement therapy over the next 4 to 6 weeks.  She will initially start taking it every other day and then gradually cut it down. Once she is completely off of estrogen replacement therapy then we will consider starting her on antiestrogen therapy. I did not discuss much about anastrozole at this time.  It may take several months before we can start antiestrogen therapy.  Previously when she came off of estrogen replacement she became very angry and abusive to her children.  I sent a prescription for Effexor today.  No orders of the defined types were placed in this encounter.  The patient has a good understanding of the overall  plan. she agrees with it. she will call with any problems that may develop before the next visit here. Total time spent: 30 mins including face to face time and time spent for planning, charting and co-ordination of care   Tamsen Meek, MD 11/30/23

## 2023-11-30 NOTE — Assessment & Plan Note (Signed)
10/15/2023:Screening mammogram detected right breast distortion at 6:00 measuring 1.1 cm, stereotactic biopsy: Grade 1 ILC ER 95%, PR 95%, Ki-67 3%, HER2 negative, axilla negative (left breast biopsy: Benign)  11/11/2023:Right lumpectomy: Grade 1 ILC 1.4 cm with LCIS, negative for lymphovascular invasion, 0/8 lymph nodes negative, ER 95%, PR 95%, HER2 1+ negative, Ki-67 3%  Recommendations: 1. Oncotype DX testing to determine if chemotherapy would be of any benefit followed by 2. Adjuvant radiation therapy followed by 3. Adjuvant antiestrogen therapy  Oncotype counseling: I discussed Oncotype DX test. I explained to the patient that this is a 21 gene panel to evaluate patient tumors DNA to calculate recurrence score. This would help determine whether patient has high risk or low risk breast cancer. She understands that if her tumor was found to be high risk, she would benefit from systemic chemotherapy. If low risk, no need of chemotherapy.  Return to clinic based on Oncotype DX test result

## 2023-12-03 ENCOUNTER — Ambulatory Visit: Payer: Commercial Managed Care - HMO | Admitting: Radiation Oncology

## 2023-12-07 ENCOUNTER — Ambulatory Visit: Payer: Medicaid Other | Admitting: Physician Assistant

## 2023-12-07 ENCOUNTER — Encounter: Payer: Self-pay | Admitting: *Deleted

## 2023-12-07 ENCOUNTER — Telehealth: Payer: Self-pay

## 2023-12-07 NOTE — Telephone Encounter (Signed)
Called Lori Wise back about her recent Effexor prescription and how she took it on Friday and Saturday and it made her sleep all day.  She stopped it because she was not sure if this was an expected side effect. Advised her to give it a 2 week trial and take it every other day. If she starts to get less sleepy on it, take it daily. She will call us back if she has any other questions or concerns. [name], RN

## 2023-12-09 DIAGNOSIS — Z419 Encounter for procedure for purposes other than remedying health state, unspecified: Secondary | ICD-10-CM | POA: Diagnosis not present

## 2023-12-11 ENCOUNTER — Encounter: Payer: Self-pay | Admitting: *Deleted

## 2023-12-12 ENCOUNTER — Encounter: Payer: Self-pay | Admitting: Physical Therapy

## 2023-12-14 ENCOUNTER — Ambulatory Visit: Payer: Medicaid Other | Admitting: Physical Therapy

## 2023-12-14 NOTE — Therapy (Signed)
 OUTPATIENT PHYSICAL THERAPY BREAST CANCER POST OP FOLLOW UP   Patient Name: Lori Wise MRN: [id number] DOB:1961/02/26, 63 y.o., female Today's Date: 12/15/2023  END OF SESSION:  PT End of Session - 12/15/23 1551     Visit Number 2    Number of Visits 2    Date for PT Re-Evaluation 12/16/23    PT Start Time 1400    PT Stop Time 1440    PT Time Calculation (min) 40 min    Activity Tolerance Patient tolerated treatment well    Behavior During Therapy [facility] for tasks assessed/performed             Past Medical History:  Diagnosis Date   Acute laryngopharyngitis 04/19/2020   Anxiety 04/19/2020   Atypical nevus 04/19/2020   Bronchitis 04/30/2020   Chronic obstructive pulmonary disease (HCC) 10/22/2020   Chronic obstructive pulmonary disease with (acute) lower respiratory infection (HCC)    Encounter for immunization 04/19/2020   Essential (primary) hypertension    Gastroesophageal reflux disease 10/22/2020   Hormone replacement therapy (HRT) 10/22/2020   Malignant neoplasm of lower-inner quadrant of right female breast (HCC)    Migraine without aura, not intractable, without status migrainosus    Mixed hyperlipidemia    Other chest pain 03/21/2021   Other specified hypothyroidism 04/19/2020   Primary hypertension 03/21/2021   Right leg pain 07/23/2020   Past Surgical History:  Procedure Laterality Date   ABDOMINAL HYSTERECTOMY     BREAST BIOPSY Left 10/15/2023   MM LT BREAST BX W LOC DEV 1ST LESION IMAGE BX SPEC STEREO GUIDE 10/15/2023 [facility]   BREAST BIOPSY Right 10/15/2023   MM RT BREAST BX W LOC DEV 1ST LESION IMAGE BX SPEC STEREO GUIDE 10/15/2023 [facility]   BREAST BIOPSY  11/10/2023   MM RT RADIOACTIVE SEED LOC MAMMO GUIDE 11/10/2023 [facility]   BREAST LUMPECTOMY WITH RADIOACTIVE SEED AND SENTINEL LYMPH NODE BIOPSY Right 11/11/2023   Procedure: RIGHT BREAST SEED LOCALIZED  LUMPECTOMY AND SENTINEL NODE BIOPSY;  Surgeon: Vernetta Berg, MD;   Location: [facility];  Service: General;  Laterality: Right;   CARDIAC CATHETERIZATION  2000   normal   CHOLECYSTECTOMY     NASAL HEMORRHAGE CONTROL     OTHER SURGICAL HISTORY     Ear surgeries x5   Patient Active Problem List   Diagnosis Date Noted   Genetic testing 11/04/2023   Malignant neoplasm of lower-inner quadrant of right breast of female, estrogen receptor positive (HCC) 10/19/2023   Epistaxis 05/06/2023   Acute cystitis with hematuria 04/16/2023   Neck pain 04/16/2023   Tobacco abuse 01/28/2023   Allergic rhinitis due to allergen 04/15/2022   Need for pneumococcal 20-valent conjugate vaccination 04/15/2022   Vitamin D  deficiency 04/15/2022   Smoking 08/30/2021   Chronic obstructive pulmonary disease with (acute) lower respiratory infection (HCC)    Essential (primary) hypertension    Migraine without aura, not intractable, without status migrainosus    Sunburn of second degree    Atypical chest pain 03/21/2021   Primary hypertension 03/21/2021   Chronic obstructive pulmonary disease (HCC) 10/22/2020   Hormone replacement therapy (HRT) 10/22/2020   GERD without esophagitis 10/22/2020   Right leg pain 07/23/2020   Bronchitis 04/30/2020   Mixed hyperlipidemia 04/19/2020   Other specified hypothyroidism 04/19/2020   Anxiety 04/19/2020   Acute laryngopharyngitis 04/19/2020   Encounter for immunization 04/19/2020   Atypical nevus 04/19/2020    PCP: Camie Moats, [name]  REFERRING PROVIDER: Dr. Berg Vernetta  REFERRING DIAG: Rt breast cancer  THERAPY DIAG:  Malignant neoplasm of lower-inner quadrant of right breast of female, estrogen receptor positive (HCC)  Aftercare following surgery for neoplasm  At risk for lymphedema  Rationale for Evaluation and Treatment: Rehabilitation  ONSET DATE: 09/16/23  SUBJECTIVE:                                                                                                                                                                                            SUBJECTIVE STATEMENT: I have been doing okay except for the seroma in the armpit.  They have had to drain it 2 times.  The incisions are healed.  Not feeling swollen.    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 It measures 1.1 cm and is located in the lower inner quadrant. It is ER/PR positive and HER2 negative with a Ki67 of 3%. Rt lumpectomy and SLNB on 11/11/23 with 8 negative nodes removed.   PATIENT GOALS:  Reassess how my recovery is going related to arm function, pain, and swelling.  PAIN:  Are you having pain? Yes: NPRS scale: 0.5/10 Pain location: Rt breast Pain description: achy Aggravating factors: bouncy, touching it  Relieving factors: the compression bra   PRECAUTIONS: Recent Surgery, right UE Lymphedema risk  RED FLAGS: None   ACTIVITY LEVEL / LEISURE: everything except washing the walls or heavier housework.     OBJECTIVE:   PATIENT SURVEYS:  QUICK DASH: 36%   OBSERVATIONS: Healed well, no signs of swelling - small seroma but barely noted in axilla , no cording   POSTURE:  Rounded shoulders   LYMPHEDEMA ASSESSMENT:  UPPER EXTREMITY AROM/PROM:   A/PROM RIGHT   eval   12/15/23  Shoulder extension 42 50  Shoulder flexion 145 145  Shoulder abduction 151 158  Shoulder internal rotation 64 75  Shoulder external rotation 89 88                          (Blank rows = not tested)   A/PROM LEFT   eval  Shoulder extension 39  Shoulder flexion 142  Shoulder abduction 164  Shoulder internal rotation 71  Shoulder external rotation 89                          (Blank rows = not tested)   CERVICAL AROM: All within normal limits   UPPER EXTREMITY STRENGTH: WNL   LYMPHEDEMA ASSESSMENTS (in cm):    LANDMARK RIGHT   eval 12/15/23  10 cm  proximal to olecranon process 28 28.5  Olecranon process 23.2 23.5  10 cm proximal to ulnar styloid  process 21.5 21.0  Just proximal to ulnar styloid process 14.7 14.7  Across hand at thumb web space 17.5 18  At base of 2nd digit 5.6 5.9  (Blank rows = not tested)   LANDMARK LEFT   eval 12/15/2023  10 cm proximal to olecranon process 27   Olecranon process 22.9   10 cm proximal to ulnar styloid process 19.7   Just proximal to ulnar styloid process 14.7   Across hand at thumb web space 18   At base of 2nd digit 5.7   (Blank rows = not tested)   PATIENT EDUCATION:  Education details: post op instruction per below Person educated: Patient Education method: Explanation, Demonstration, Tactile cues, Verbal cues, and Handouts Education comprehension: verbalized understanding  HOME EXERCISE PROGRAM: Reviewed previously given post op HEP. Starting back into her normal exercise routine  ASSESSMENT:  CLINICAL IMPRESSION: Pt is doing very well post operatively.  She has returned to baseline AROM and is doing well.  She was reeducated on lymphedema and is now signed up for the ABC class.  She will continue with SOZO screens.   Pt will benefit from skilled therapeutic intervention to improve on the following deficits: Decreased knowledge of precautions, impaired UE functional use, pain, decreased ROM, postural dysfunction.   PT treatment/interventions: ADL/Self care home management, [phone]- PT Re-Evaluation   GOALS: Goals reviewed with patient? Yes  LONG TERM GOALS:  (STG=LTG)  GOALS Name Target Date  Goal status  1 Pt will demonstrate she has regained full shoulder ROM and function post operatively compared to baselines.  Baseline: 12/15/23 MET                    PLAN:  PT FREQUENCY/DURATION: SOZO screens only  PLAN FOR NEXT SESSION: SOZO screens    [facility]  [address]  [place] [address]  [phone]  After Breast Cancer Class It is recommended you attend the ABC class to be educated on lymphedema risk reduction. This class  is free of charge and lasts for 1 hour. It is a 1-time class. You will need to download the TEAMS app either on your phone or computer. We will send you a link the night before or the morning of the class. You should be able to click on that link to join the class. This is not a confidential class. You don't have to turn your camera on, but other participants may be able to see your email address.  Scar massage You can begin gentle scar massage to you incision sites. Gently place one hand on the incision and move the skin (without sliding on the skin) in various directions. Do this for a few minutes and then you can gently massage either coconut oil or vitamin E cream into the scars.  Compression garment You should continue wearing your compression bra until you feel like you no longer have swelling.  Home exercise Program Continue doing the exercises you were given until you feel like you can do them without feeling any tightness at the end.   Walking Program Studies show that 30 minutes of walking per day (fast enough to elevate your heart rate) can significantly reduce the risk of a cancer recurrence. If you can't walk due to other medical reasons, we encourage you to find another activity you could do (like a stationary bike or  water exercise).  Posture After breast cancer surgery, people frequently sit with rounded shoulders posture because it puts their incisions on slack and feels better. If you sit like this and scar tissue forms in that position, you can become very tight and have pain sitting or standing with good posture. Try to be aware of your posture and sit and stand up tall to heal properly.  Follow up PT: It is recommended you return every 3 months for the first 3 years following surgery to be assessed on the SOZO machine for an L-Dex score. This helps prevent clinically significant lymphedema in 95% of patients. These follow up screens are 10 minute appointments that you are not  billed for.  Larue Saddie SAUNDERS, PT 12/15/2023, 3:52 PM

## 2023-12-15 ENCOUNTER — Telehealth: Payer: Self-pay | Admitting: *Deleted

## 2023-12-15 ENCOUNTER — Encounter: Payer: Self-pay | Admitting: *Deleted

## 2023-12-15 ENCOUNTER — Ambulatory Visit: Payer: Medicaid Other | Attending: Surgery | Admitting: Rehabilitation

## 2023-12-15 ENCOUNTER — Encounter: Payer: Self-pay | Admitting: Rehabilitation

## 2023-12-15 DIAGNOSIS — C50311 Malignant neoplasm of lower-inner quadrant of right female breast: Secondary | ICD-10-CM | POA: Diagnosis not present

## 2023-12-15 DIAGNOSIS — Z483 Aftercare following surgery for neoplasm: Secondary | ICD-10-CM | POA: Diagnosis not present

## 2023-12-15 DIAGNOSIS — Z170 Estrogen receptor positive status [ER+]: Secondary | ICD-10-CM | POA: Insufficient documentation

## 2023-12-15 DIAGNOSIS — Z9189 Other specified personal risk factors, not elsewhere classified: Secondary | ICD-10-CM | POA: Insufficient documentation

## 2023-12-15 NOTE — Telephone Encounter (Signed)
 Called and left a message Katrina in [place] at Dr. Bernerd Pho office for an update on her xrt scheduling. Waiting for a response.

## 2023-12-16 ENCOUNTER — Other Ambulatory Visit: Payer: Self-pay | Admitting: Physician Assistant

## 2023-12-16 DIAGNOSIS — I10 Essential (primary) hypertension: Secondary | ICD-10-CM

## 2023-12-18 ENCOUNTER — Ambulatory Visit
Admission: RE | Admit: 2023-12-18 | Discharge: 2023-12-18 | Disposition: A | Discharge status: Home / Self Care | Payer: Medicaid Other | Source: Ambulatory Visit | Attending: Radiation Oncology | Admitting: Radiation Oncology

## 2023-12-18 ENCOUNTER — Encounter: Payer: Self-pay | Admitting: *Deleted

## 2023-12-18 DIAGNOSIS — Z170 Estrogen receptor positive status [ER+]: Secondary | ICD-10-CM

## 2023-12-18 DIAGNOSIS — Z510 Encounter for antineoplastic radiation therapy: Secondary | ICD-10-CM | POA: Insufficient documentation

## 2023-12-18 DIAGNOSIS — C50311 Malignant neoplasm of lower-inner quadrant of right female breast: Secondary | ICD-10-CM | POA: Diagnosis not present

## 2023-12-21 ENCOUNTER — Telehealth: Payer: Self-pay | Admitting: Hematology and Oncology

## 2023-12-21 NOTE — Telephone Encounter (Signed)
Patient is aware of scheduled appointments times/dates

## 2023-12-22 DIAGNOSIS — Z170 Estrogen receptor positive status [ER+]: Secondary | ICD-10-CM | POA: Diagnosis not present

## 2023-12-22 DIAGNOSIS — C50311 Malignant neoplasm of lower-inner quadrant of right female breast: Secondary | ICD-10-CM | POA: Diagnosis not present

## 2023-12-22 DIAGNOSIS — Z510 Encounter for antineoplastic radiation therapy: Secondary | ICD-10-CM | POA: Diagnosis not present

## 2023-12-28 ENCOUNTER — Ambulatory Visit: Payer: Medicaid Other

## 2023-12-29 ENCOUNTER — Other Ambulatory Visit: Payer: Self-pay

## 2023-12-29 ENCOUNTER — Ambulatory Visit
Admission: RE | Admit: 2023-12-29 | Discharge: 2023-12-29 | Disposition: A | Discharge status: Home / Self Care | Payer: Medicaid Other | Source: Ambulatory Visit | Attending: Radiation Oncology | Admitting: Radiation Oncology

## 2023-12-29 ENCOUNTER — Ambulatory Visit: Payer: Medicaid Other

## 2023-12-29 DIAGNOSIS — Z170 Estrogen receptor positive status [ER+]: Secondary | ICD-10-CM | POA: Diagnosis not present

## 2023-12-29 DIAGNOSIS — C50311 Malignant neoplasm of lower-inner quadrant of right female breast: Secondary | ICD-10-CM | POA: Diagnosis not present

## 2023-12-29 DIAGNOSIS — Z510 Encounter for antineoplastic radiation therapy: Secondary | ICD-10-CM | POA: Diagnosis not present

## 2023-12-29 LAB — RAD ONC ARIA SESSION SUMMARY
Course Elapsed Days: 0
Plan Fractions Treated to Date: 1
Plan Prescribed Dose Per Fraction: 2.66 Gy
Plan Total Fractions Prescribed: 16
Plan Total Prescribed Dose: 42.56 Gy
Reference Point Dosage Given to Date: 2.66 Gy
Reference Point Session Dosage Given: 2.66 Gy
Session Number: 1

## 2023-12-30 ENCOUNTER — Ambulatory Visit: Admission: RE | Admit: 2023-12-30 | Payer: Medicaid Other | Source: Ambulatory Visit

## 2023-12-30 ENCOUNTER — Other Ambulatory Visit: Payer: Self-pay | Admitting: Family Medicine

## 2023-12-30 DIAGNOSIS — F419 Anxiety disorder, unspecified: Secondary | ICD-10-CM

## 2023-12-31 ENCOUNTER — Ambulatory Visit
Admission: RE | Admit: 2023-12-31 | Discharge: 2023-12-31 | Disposition: A | Discharge status: Home / Self Care | Payer: Medicaid Other | Source: Ambulatory Visit | Attending: Radiation Oncology | Admitting: Radiation Oncology

## 2023-12-31 ENCOUNTER — Other Ambulatory Visit: Payer: Self-pay

## 2023-12-31 DIAGNOSIS — Z170 Estrogen receptor positive status [ER+]: Secondary | ICD-10-CM | POA: Diagnosis not present

## 2023-12-31 DIAGNOSIS — C50311 Malignant neoplasm of lower-inner quadrant of right female breast: Secondary | ICD-10-CM | POA: Diagnosis not present

## 2023-12-31 DIAGNOSIS — Z510 Encounter for antineoplastic radiation therapy: Secondary | ICD-10-CM | POA: Diagnosis not present

## 2023-12-31 LAB — RAD ONC ARIA SESSION SUMMARY
Course Elapsed Days: 2
Plan Fractions Treated to Date: 2
Plan Prescribed Dose Per Fraction: 2.66 Gy
Plan Total Fractions Prescribed: 16
Plan Total Prescribed Dose: 42.56 Gy
Reference Point Dosage Given to Date: 5.32 Gy
Reference Point Session Dosage Given: 2.66 Gy
Session Number: 2

## 2024-01-01 ENCOUNTER — Other Ambulatory Visit: Payer: Self-pay

## 2024-01-01 ENCOUNTER — Ambulatory Visit
Admission: RE | Admit: 2024-01-01 | Discharge: 2024-01-01 | Disposition: A | Discharge status: Home / Self Care | Payer: Medicaid Other | Source: Ambulatory Visit | Attending: Radiation Oncology | Admitting: Radiation Oncology

## 2024-01-01 DIAGNOSIS — Z510 Encounter for antineoplastic radiation therapy: Secondary | ICD-10-CM | POA: Diagnosis not present

## 2024-01-01 DIAGNOSIS — C50311 Malignant neoplasm of lower-inner quadrant of right female breast: Secondary | ICD-10-CM | POA: Diagnosis not present

## 2024-01-01 LAB — RAD ONC ARIA SESSION SUMMARY
Course Elapsed Days: 3
Plan Fractions Treated to Date: 3
Plan Prescribed Dose Per Fraction: 2.66 Gy
Plan Total Fractions Prescribed: 16
Plan Total Prescribed Dose: 42.56 Gy
Reference Point Dosage Given to Date: 7.98 Gy
Reference Point Session Dosage Given: 2.66 Gy
Session Number: 3

## 2024-01-04 ENCOUNTER — Ambulatory Visit
Admission: RE | Admit: 2024-01-04 | Discharge: 2024-01-04 | Disposition: A | Discharge status: Home / Self Care | Payer: Medicaid Other | Source: Ambulatory Visit | Attending: Radiation Oncology

## 2024-01-04 ENCOUNTER — Other Ambulatory Visit: Payer: Self-pay

## 2024-01-04 DIAGNOSIS — C50311 Malignant neoplasm of lower-inner quadrant of right female breast: Secondary | ICD-10-CM | POA: Diagnosis not present

## 2024-01-04 DIAGNOSIS — Z510 Encounter for antineoplastic radiation therapy: Secondary | ICD-10-CM | POA: Diagnosis not present

## 2024-01-04 DIAGNOSIS — Z170 Estrogen receptor positive status [ER+]: Secondary | ICD-10-CM | POA: Diagnosis not present

## 2024-01-04 LAB — RAD ONC ARIA SESSION SUMMARY
Course Elapsed Days: 6
Plan Fractions Treated to Date: 4
Plan Prescribed Dose Per Fraction: 2.66 Gy
Plan Total Fractions Prescribed: 16
Plan Total Prescribed Dose: 42.56 Gy
Reference Point Dosage Given to Date: 10.64 Gy
Reference Point Session Dosage Given: 2.66 Gy
Session Number: 4

## 2024-01-05 ENCOUNTER — Ambulatory Visit
Admission: RE | Admit: 2024-01-05 | Discharge: 2024-01-05 | Disposition: A | Discharge status: Home / Self Care | Payer: Medicaid Other | Source: Ambulatory Visit | Attending: Radiation Oncology | Admitting: Radiation Oncology

## 2024-01-05 ENCOUNTER — Other Ambulatory Visit: Payer: Self-pay

## 2024-01-05 DIAGNOSIS — C50311 Malignant neoplasm of lower-inner quadrant of right female breast: Secondary | ICD-10-CM | POA: Diagnosis not present

## 2024-01-05 DIAGNOSIS — Z510 Encounter for antineoplastic radiation therapy: Secondary | ICD-10-CM | POA: Diagnosis not present

## 2024-01-05 DIAGNOSIS — Z170 Estrogen receptor positive status [ER+]: Secondary | ICD-10-CM | POA: Diagnosis not present

## 2024-01-05 LAB — RAD ONC ARIA SESSION SUMMARY
Course Elapsed Days: 7
Plan Fractions Treated to Date: 5
Plan Prescribed Dose Per Fraction: 2.66 Gy
Plan Total Fractions Prescribed: 16
Plan Total Prescribed Dose: 42.56 Gy
Reference Point Dosage Given to Date: 13.3 Gy
Reference Point Session Dosage Given: 2.66 Gy
Session Number: 5

## 2024-01-06 ENCOUNTER — Other Ambulatory Visit: Payer: Self-pay

## 2024-01-06 ENCOUNTER — Ambulatory Visit
Admission: RE | Admit: 2024-01-06 | Discharge: 2024-01-06 | Disposition: A | Discharge status: Home / Self Care | Payer: Medicaid Other | Source: Ambulatory Visit | Attending: Radiation Oncology

## 2024-01-06 DIAGNOSIS — Z510 Encounter for antineoplastic radiation therapy: Secondary | ICD-10-CM | POA: Diagnosis not present

## 2024-01-06 DIAGNOSIS — Z170 Estrogen receptor positive status [ER+]: Secondary | ICD-10-CM | POA: Diagnosis not present

## 2024-01-06 DIAGNOSIS — C50311 Malignant neoplasm of lower-inner quadrant of right female breast: Secondary | ICD-10-CM | POA: Diagnosis not present

## 2024-01-06 LAB — RAD ONC ARIA SESSION SUMMARY
Course Elapsed Days: 8
Plan Fractions Treated to Date: 6
Plan Prescribed Dose Per Fraction: 2.66 Gy
Plan Total Fractions Prescribed: 16
Plan Total Prescribed Dose: 42.56 Gy
Reference Point Dosage Given to Date: 15.96 Gy
Reference Point Session Dosage Given: 2.66 Gy
Session Number: 6

## 2024-01-07 ENCOUNTER — Other Ambulatory Visit: Payer: Self-pay

## 2024-01-07 ENCOUNTER — Ambulatory Visit
Admission: RE | Admit: 2024-01-07 | Discharge: 2024-01-07 | Disposition: A | Discharge status: Home / Self Care | Payer: Medicaid Other | Source: Ambulatory Visit | Attending: Radiation Oncology | Admitting: Radiation Oncology

## 2024-01-07 DIAGNOSIS — Z170 Estrogen receptor positive status [ER+]: Secondary | ICD-10-CM | POA: Diagnosis not present

## 2024-01-07 DIAGNOSIS — Z510 Encounter for antineoplastic radiation therapy: Secondary | ICD-10-CM | POA: Diagnosis not present

## 2024-01-07 DIAGNOSIS — C50311 Malignant neoplasm of lower-inner quadrant of right female breast: Secondary | ICD-10-CM | POA: Diagnosis not present

## 2024-01-07 LAB — RAD ONC ARIA SESSION SUMMARY
Course Elapsed Days: 9
Plan Fractions Treated to Date: 7
Plan Prescribed Dose Per Fraction: 2.66 Gy
Plan Total Fractions Prescribed: 16
Plan Total Prescribed Dose: 42.56 Gy
Reference Point Dosage Given to Date: 18.62 Gy
Reference Point Session Dosage Given: 2.66 Gy
Session Number: 7

## 2024-01-08 ENCOUNTER — Ambulatory Visit
Admission: RE | Admit: 2024-01-08 | Discharge: 2024-01-08 | Disposition: A | Discharge status: Home / Self Care | Payer: Medicaid Other | Source: Ambulatory Visit | Attending: Radiation Oncology

## 2024-01-08 ENCOUNTER — Other Ambulatory Visit: Payer: Self-pay

## 2024-01-08 DIAGNOSIS — C50311 Malignant neoplasm of lower-inner quadrant of right female breast: Secondary | ICD-10-CM | POA: Diagnosis not present

## 2024-01-08 DIAGNOSIS — Z510 Encounter for antineoplastic radiation therapy: Secondary | ICD-10-CM | POA: Diagnosis not present

## 2024-01-08 DIAGNOSIS — Z170 Estrogen receptor positive status [ER+]: Secondary | ICD-10-CM | POA: Diagnosis not present

## 2024-01-08 LAB — RAD ONC ARIA SESSION SUMMARY
Course Elapsed Days: 10
Plan Fractions Treated to Date: 8
Plan Prescribed Dose Per Fraction: 2.66 Gy
Plan Total Fractions Prescribed: 16
Plan Total Prescribed Dose: 42.56 Gy
Reference Point Dosage Given to Date: 21.28 Gy
Reference Point Session Dosage Given: 2.66 Gy
Session Number: 8

## 2024-01-09 DIAGNOSIS — Z419 Encounter for procedure for purposes other than remedying health state, unspecified: Secondary | ICD-10-CM | POA: Diagnosis not present

## 2024-01-11 ENCOUNTER — Other Ambulatory Visit: Payer: Self-pay

## 2024-01-11 ENCOUNTER — Encounter: Payer: Self-pay | Admitting: Physician Assistant

## 2024-01-11 ENCOUNTER — Ambulatory Visit
Admission: RE | Admit: 2024-01-11 | Discharge: 2024-01-11 | Disposition: A | Discharge status: Home / Self Care | Payer: Medicaid Other | Source: Ambulatory Visit | Attending: Radiation Oncology | Admitting: Radiation Oncology

## 2024-01-11 ENCOUNTER — Ambulatory Visit: Payer: Medicaid Other | Admitting: Physician Assistant

## 2024-01-11 VITALS — BP 102/68 | HR 84 | Temp 96.4°F | Resp 18 | Ht 64.0 in | Wt 135.8 lb

## 2024-01-11 DIAGNOSIS — I10 Essential (primary) hypertension: Secondary | ICD-10-CM | POA: Diagnosis not present

## 2024-01-11 DIAGNOSIS — C50311 Malignant neoplasm of lower-inner quadrant of right female breast: Secondary | ICD-10-CM | POA: Insufficient documentation

## 2024-01-11 DIAGNOSIS — E038 Other specified hypothyroidism: Secondary | ICD-10-CM

## 2024-01-11 DIAGNOSIS — K219 Gastro-esophageal reflux disease without esophagitis: Secondary | ICD-10-CM

## 2024-01-11 DIAGNOSIS — E782 Mixed hyperlipidemia: Secondary | ICD-10-CM | POA: Diagnosis not present

## 2024-01-11 DIAGNOSIS — E559 Vitamin D deficiency, unspecified: Secondary | ICD-10-CM

## 2024-01-11 DIAGNOSIS — C50911 Malignant neoplasm of unspecified site of right female breast: Secondary | ICD-10-CM

## 2024-01-11 DIAGNOSIS — R739 Hyperglycemia, unspecified: Secondary | ICD-10-CM

## 2024-01-11 DIAGNOSIS — Z170 Estrogen receptor positive status [ER+]: Secondary | ICD-10-CM | POA: Diagnosis not present

## 2024-01-11 DIAGNOSIS — Z510 Encounter for antineoplastic radiation therapy: Secondary | ICD-10-CM | POA: Insufficient documentation

## 2024-01-11 LAB — RAD ONC ARIA SESSION SUMMARY
Course Elapsed Days: 13
Plan Fractions Treated to Date: 9
Plan Prescribed Dose Per Fraction: 2.66 Gy
Plan Total Fractions Prescribed: 16
Plan Total Prescribed Dose: 42.56 Gy
Reference Point Dosage Given to Date: 23.94 Gy
Reference Point Session Dosage Given: 2.66 Gy
Session Number: 9

## 2024-01-11 NOTE — Progress Notes (Signed)
Subjective:  Patient ID: Lori Wise, female    DOB: [birth year]  Age: 63 y.o. MRN: [id number]  Chief Complaint  Patient presents with   Medical Management of Chronic Issues    .  Hyperlipidemia    Pt presents for follow up of hypertension. The patient is tolerating the medication well without side effects. Compliance with treatment has been good; including taking medication as directed , maintains a healthy diet and regular exercise regimen , and following up as directed. She is currently on tenormin 25mg  1 po bid  Pt with history asthma/COPD - is having no problems with cough/congestion or wheezing Currently uses symbicort and albuterol - she also has rx for trelegy - says she really has not been using inhalers since she stopped smoking however She is due to repeat chest CT in 3/25 but defers at this time She quit smoking 05/28/23  Pt with history of gerd - sympotms stable on prilosec 20mg  qd -   Mixed hyperlipidemia  Pt presents with hyperlipidemia.  The patient is compliant with medications, maintains a low cholesterol diet , follows up as directed , and maintains an exercise regimen . The patient denies experiencing any hypercholesterolemia related symptoms. Pt currently on crestor 5mg  qd and zetia 10mg  qd - she also takes fish oil qd  Pt with history of hypothyroidism - due for labwork - taking synthroid qd - voices no concerns  Pt with history of anxiety - symptoms stable on lexapro and alprazolam that she takes as needed  Since last visit pt has been diagnosed with breast cancer of right breast - currently following with oncology and receiving radiation treatment She is tolerating well  Current Outpatient Medications on File Prior to Visit  Medication Sig Dispense Refill   albuterol (VENTOLIN HFA) 108 (90 Base) MCG/ACT inhaler Inhale 2 puffs into the lungs every 6 (six) hours as needed for wheezing or shortness of breath. 8 g 2   ALPRAZolam (XANAX) 0.5 MG tablet TAKE  ONE TABLET BY MOUTH 3 TIMES DAILY 90 tablet 0   Ascorbic Acid (VITAMIN C PO) Take 1 tablet by mouth daily. Unknown strength     atenolol (TENORMIN) 25 MG tablet TAKE ONE TABLET BY MOUTH TWICE DAILY 60 tablet 5   cetirizine (ZYRTEC) 10 MG tablet TAKE ONE TABLET BY MOUTH DAILY 30 tablet 11   Cyanocobalamin (VITAMIN B-12 PO) Take 1 tablet by mouth daily. Unknown strenght     Dermatological Products, Misc. (STRATA XRT) GEL Apply topically daily.     Emollient (ROC RETINOL CORREXION EX) Apply topically.     ezetimibe (ZETIA) 10 MG tablet TAKE ONE TABLET BY MOUTH EVERY DAY 30 tablet 5   fluticasone (FLONASE) 50 MCG/ACT nasal spray Place 2 sprays into both nostrils daily. 16 g 6   Fluticasone-Umeclidin-Vilant (TRELEGY ELLIPTA) 200-62.5-25 MCG/ACT AEPB Inhale 1 Inhalation into the lungs daily. 1 each 5   ibuprofen (ADVIL) 800 MG tablet Take 1 tablet (800 mg total) by mouth every 8 (eight) hours as needed for mild pain or moderate pain. 90 tablet 1   ipratropium-albuterol (DUONEB) 0.5-2.5 (3) MG/3ML SOLN Take 3 mLs by nebulization every 4 (four) hours as needed. 360 mL 3   levothyroxine (SYNTHROID) 25 MCG tablet TAKE ONE TABLET BY MOUTH EVERY DAY 90 tablet 1   Magnesium 200 MG TABS Take 1 tablet by mouth daily.     Omega-3 1000 MG CAPS Take 1 capsule by mouth daily.     omeprazole (PRILOSEC) 20 MG capsule  Take 1 capsule (20 mg total) by mouth 2 (two) times daily before a meal. 180 capsule 1   rosuvastatin (CRESTOR) 5 MG tablet Take 1 tablet (5 mg total) by mouth daily. 90 tablet 0   tretinoin (RETIN-A) 0.05 % cream Apply topically at bedtime.     VITAMIN E PO Take 1 tablet by mouth daily at 6 (six) AM. Unknown strength     No current facility-administered medications on file prior to visit.   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 (HCC)    Migraine without aura, not intractable, without status migrainosus    Mixed hyperlipidemia    Other chest pain 03/21/2021   Other specified hypothyroidism 04/19/2020   Primary hypertension 03/21/2021   Right leg pain 07/23/2020   Past Surgical History:  Procedure Laterality Date   ABDOMINAL HYSTERECTOMY     BREAST BIOPSY Left 10/15/2023   MM LT BREAST BX W LOC DEV 1ST LESION IMAGE BX SPEC STEREO GUIDE 10/15/2023 [facility]   BREAST BIOPSY Right 10/15/2023   MM RT BREAST BX W LOC DEV 1ST LESION IMAGE BX SPEC STEREO GUIDE 10/15/2023 [facility]   BREAST BIOPSY  11/10/2023   MM RT RADIOACTIVE SEED LOC MAMMO GUIDE 11/10/2023 [facility]   BREAST LUMPECTOMY WITH RADIOACTIVE SEED AND SENTINEL LYMPH NODE BIOPSY Right 11/11/2023   Procedure: RIGHT BREAST SEED LOCALIZED  LUMPECTOMY AND SENTINEL NODE BIOPSY;  Surgeon: Abigail Miyamoto, MD;  Location: [facility];  Service: General;  Laterality: Right;   CARDIAC CATHETERIZATION  2000   normal   CHOLECYSTECTOMY     NASAL HEMORRHAGE CONTROL     OTHER SURGICAL HISTORY     Ear surgeries x5    Family History  Problem Relation Age of Onset   Suicidality Mother    Lung cancer Father 74       smoked   Lung cancer Sister 40       smoked   Brain cancer Brother 56   Lung cancer Brother 77       smoked   Breast cancer Paternal Aunt    Prostate cancer Paternal Uncle 27 - 99   Breast cancer Paternal Grandmother 16 - 76   Social History   Socioeconomic History   Marital status: Legally Separated    Spouse name: Not on file   Number of children: 2   Years of education: Not on file    Highest education level: 12th grade  Occupational History   Occupation: [employer] service  Tobacco Use   Smoking status: Former    Current packs/day: 0.00    Types: Cigarettes    Quit date: 05/28/2023    Years since quitting: 0.6   Smokeless tobacco: Never  Vaping Use   Vaping status: Never Used  Substance and Sexual Activity   Alcohol use: Not Currently    Comment: Drinks approximately two times per week.   Drug use: Never   Sexual activity: Not on file  Other Topics Concern   Not on file  Social History Narrative   Not on  file   Social Drivers of Health   Financial Resource Strain: Low Risk  (08/04/2023)   Overall Financial Resource Strain (CARDIA)    Difficulty of Paying Living Expenses: Not hard at all  Food Insecurity: No Food Insecurity (10/21/2023)   Hunger Vital Sign    Worried About Running Out of Food in the Last Year: Never true    Ran Out of Food in the Last Year: Never true  Transportation Needs: No Transportation Needs (10/21/2023)   PRAPARE - Administrator, Civil Service (Medical): No    Lack of Transportation (Non-Medical): No  Physical Activity: Inactive (08/04/2023)   Exercise Vital Sign    Days of Exercise per Week: 0 days    Minutes of Exercise per Session: 0 min  Stress: No Stress Concern Present (08/04/2023)   [employer] of Occupational Health - Occupational Stress Questionnaire    Feeling of Stress : Not at all  Social Connections: Unknown (08/04/2023)   Social Connection and Isolation Panel [NHANES]    Frequency of Communication with Friends and Family: More than three times a week    Frequency of Social Gatherings with Friends and Family: Three times a week    Attends Religious Services: Patient declined    Active Member of Clubs or Organizations: No    Attends Banker Meetings: Never    Marital Status: Patient declined   CONSTITUTIONAL: Negative for chills, fatigue, fever, unintentional weight gain and  unintentional weight loss.  E/N/T: Negative for ear pain, nasal congestion and sore throat.  CARDIOVASCULAR: Negative for chest pain, dizziness, palpitations and pedal edema.  RESPIRATORY: Negative for recent cough and dyspnea.  GASTROINTESTINAL: Negative for abdominal pain, acid reflux symptoms, constipation, diarrhea, nausea and vomiting.  MSK: Negative for arthralgias and myalgias.  INTEGUMENTARY: Negative for rash.  NEUROLOGICAL: Negative for dizziness and headaches.  PSYCHIATRIC: Negative for sleep disturbance and to question depression screen.  Negative for depression, negative for anhedonia.       Objective:  PHYSICAL EXAM:     01/11/2024    8:19 AM 10/21/2023    4:10 PM 08/04/2023   10:39 AM 04/15/2023    9:48 AM 10/16/2022    9:30 AM  Depression screen PHQ 2/9  Decreased Interest 2 0 0 0 0  Down, Depressed, Hopeless 2 0 0 0 0  PHQ - 2 Score 4 0 0 0 0  Altered sleeping 2  1 0 0  Tired, decreased energy 2  1 1  0  Change in appetite 3  1 0 0  Feeling bad or failure about yourself  0  0 0 0  Trouble concentrating 0  0 0 0  Moving slowly or fidgety/restless 0  0 0 0  Suicidal thoughts 0  0 0 0  PHQ-9 Score 11  3 1  0  Difficult doing work/chores Somewhat difficult  Not difficult at all Not difficult at all Not difficult at all  PHYSICAL EXAM:   VS: BP 102/68 (BP Location: Left Arm, Patient Position: Sitting, Cuff Size: Normal)   Pulse 84   Temp (!) 96.4 F (35.8 C) (Temporal)   Resp 18   Ht 5\' 4"  (1.626 m)   Wt 135 lb 12.8 oz (61.6 kg)   SpO2 95%   BMI 23.31 kg/m   GEN: Well nourished, well developed, in no acute distress  Cardiac: RRR; no murmurs, rubs, or gallops,no edema - no significant varicosities Respiratory:  normal respiratory rate and pattern with no distress - normal breath  sounds with no rales, rhonchi, wheezes or rubs GI: normal bowel sounds, no masses or tenderness MS: no deformity or atrophy  Skin: warm and dry, no rash  Neuro:  Alert and Oriented x 3,  Strength and sensation are intact - CN II-Xii grossly intact Psych: euthymic mood, appropriate affect and demeanor    Lab Results  Component Value Date   WBC 9.5 10/21/2023   HGB 12.4 10/21/2023   HCT 37.0 10/21/2023   PLT 285 10/21/2023   GLUCOSE 100 (H) 10/21/2023   CHOL 146 08/04/2023   TRIG 131 08/04/2023   HDL 58 08/04/2023   LDLCALC 65 08/04/2023   ALT 14 10/21/2023   AST 19 10/21/2023   NA 143 10/21/2023   K 3.3 (L) 10/21/2023   CL 110 10/21/2023   CREATININE 0.71 10/21/2023   BUN 9 10/21/2023   CO2 28 10/21/2023   TSH 2.160 08/04/2023      Assessment & Plan:   Problem List Items Addressed This Visit       Cardiovascular and Mediastinum   Primary hypertension   Relevant Orders   CBC with Differential/Platelet   Comprehensive metabolic panel Continue meds Low salt diet     Respiratory   Chronic obstructive pulmonary disease with (acute) lower respiratory infection (HCC) Continue inhalers as directed Pt will schedule repeat chest CT at later date     Digestive   GERD without esophagitis - Primary Continue prilosec 20mg  qd     Endocrine   Other specified hypothyroidism   Relevant Orders   TSH Continue synthroid qd     Other   Mixed hyperlipidemia   Relevant Orders   Lipid panel Continue fish oil, zetia and crestor Low chol diet   Anxiety Continue lexapro qd and ativan prn   Vitamin D deficiency   Relevant Orders   VITAMIN D 25 Hydroxy (Vit-D Deficiency, Fractures)  Malignant neoplasm of right breast  Follow up with radiology as directed and continue radiation  .  No orders of the defined types were placed in this encounter.   Orders Placed This Encounter  Procedures   CBC with Differential/Platelet   Comprehensive metabolic panel   TSH   Lipid panel   VITAMIN D 25 Hydroxy (Vit-D Deficiency, Fractures)   Hemoglobin A1c     Follow-up: Return in about 4 months (around 05/10/2024) for chronic fasting follow-up.  An After  Visit Summary was printed and given to the patient.  Jettie Pagan [facility] [phone]

## 2024-01-12 ENCOUNTER — Encounter: Payer: Self-pay | Admitting: Physician Assistant

## 2024-01-12 ENCOUNTER — Ambulatory Visit
Admission: RE | Admit: 2024-01-12 | Discharge: 2024-01-12 | Disposition: A | Discharge status: Home / Self Care | Payer: Medicaid Other | Source: Ambulatory Visit | Attending: Radiation Oncology | Admitting: Radiation Oncology

## 2024-01-12 ENCOUNTER — Other Ambulatory Visit: Payer: Self-pay

## 2024-01-12 ENCOUNTER — Ambulatory Visit: Payer: Medicaid Other | Admitting: Radiation Oncology

## 2024-01-12 DIAGNOSIS — C50311 Malignant neoplasm of lower-inner quadrant of right female breast: Secondary | ICD-10-CM | POA: Diagnosis not present

## 2024-01-12 DIAGNOSIS — Z170 Estrogen receptor positive status [ER+]: Secondary | ICD-10-CM | POA: Diagnosis not present

## 2024-01-12 DIAGNOSIS — Z510 Encounter for antineoplastic radiation therapy: Secondary | ICD-10-CM | POA: Diagnosis not present

## 2024-01-12 LAB — RAD ONC ARIA SESSION SUMMARY
Course Elapsed Days: 14
Plan Fractions Treated to Date: 10
Plan Prescribed Dose Per Fraction: 2.66 Gy
Plan Total Fractions Prescribed: 16
Plan Total Prescribed Dose: 42.56 Gy
Reference Point Dosage Given to Date: 26.6 Gy
Reference Point Session Dosage Given: 2.66 Gy
Session Number: 10

## 2024-01-12 LAB — COMPREHENSIVE METABOLIC PANEL
ALT: 18 [IU]/L (ref 0–32)
AST: 20 [IU]/L (ref 0–40)
Albumin: 4.3 g/dL (ref 3.9–4.9)
Alkaline Phosphatase: 77 [IU]/L (ref 44–121)
BUN/Creatinine Ratio: 19 (ref 12–28)
BUN: 15 mg/dL (ref 8–27)
Bilirubin Total: 0.4 mg/dL (ref 0.0–1.2)
CO2: 21 mmol/L (ref 20–29)
Calcium: 9.7 mg/dL (ref 8.7–10.3)
Chloride: 106 mmol/L (ref 96–106)
Creatinine, Ser: 0.8 mg/dL (ref 0.57–1.00)
Globulin, Total: 2 g/dL (ref 1.5–4.5)
Glucose: 89 mg/dL (ref 70–99)
Potassium: 4.3 mmol/L (ref 3.5–5.2)
Sodium: 141 mmol/L (ref 134–144)
Total Protein: 6.3 g/dL (ref 6.0–8.5)
eGFR: 83 mL/min/{1.73_m2} (ref >59)

## 2024-01-12 LAB — CBC WITH DIFFERENTIAL/PLATELET
Basophils Absolute: 0.1 10*3/uL (ref 0.0–0.2)
Basos: 1 %
EOS (ABSOLUTE): 0.3 10*3/uL (ref 0.0–0.4)
Eos: 4 %
Hematocrit: 40.1 % (ref 34.0–46.6)
Hemoglobin: 13.1 g/dL (ref 11.1–15.9)
Immature Grans (Abs): 0 10*3/uL (ref 0.0–0.1)
Immature Granulocytes: 1 %
Lymphocytes Absolute: 3.4 10*3/uL — ABNORMAL HIGH (ref 0.7–3.1)
Lymphs: 38 %
MCH: 29.6 pg (ref 26.6–33.0)
MCHC: 32.7 g/dL (ref 31.5–35.7)
MCV: 91 fL (ref 79–97)
Monocytes Absolute: 0.8 10*3/uL (ref 0.1–0.9)
Monocytes: 9 %
Neutrophils Absolute: 4.3 10*3/uL (ref 1.4–7.0)
Neutrophils: 47 %
Platelets: 315 10*3/uL (ref 150–450)
RBC: 4.43 x10E6/uL (ref 3.77–5.28)
RDW: 12.4 % (ref 11.7–15.4)
WBC: 8.8 10*3/uL (ref 3.4–10.8)

## 2024-01-12 LAB — LIPID PANEL
Chol/HDL Ratio: 2.6 {ratio} (ref 0.0–4.4)
Cholesterol, Total: 130 mg/dL (ref 100–199)
HDL: 50 mg/dL (ref >39)
LDL Chol Calc (NIH): 61 mg/dL (ref 0–99)
Triglycerides: 105 mg/dL (ref 0–149)
VLDL Cholesterol Cal: 19 mg/dL (ref 5–40)

## 2024-01-12 LAB — HEMOGLOBIN A1C
Est. average glucose Bld gHb Est-mCnc: 126 mg/dL
Hgb A1c MFr Bld: 6 % — ABNORMAL HIGH (ref 4.8–5.6)

## 2024-01-12 LAB — TSH: TSH: 2.22 u[IU]/mL (ref 0.450–4.500)

## 2024-01-12 LAB — VITAMIN D 25 HYDROXY (VIT D DEFICIENCY, FRACTURES): Vit D, 25-Hydroxy: 47.4 ng/mL (ref 30.0–100.0)

## 2024-01-13 ENCOUNTER — Other Ambulatory Visit: Payer: Self-pay

## 2024-01-13 ENCOUNTER — Ambulatory Visit
Admission: RE | Admit: 2024-01-13 | Discharge: 2024-01-13 | Disposition: A | Discharge status: Home / Self Care | Payer: Medicaid Other | Source: Ambulatory Visit | Attending: Radiation Oncology | Admitting: Radiation Oncology

## 2024-01-13 DIAGNOSIS — C50311 Malignant neoplasm of lower-inner quadrant of right female breast: Secondary | ICD-10-CM | POA: Diagnosis not present

## 2024-01-13 DIAGNOSIS — Z510 Encounter for antineoplastic radiation therapy: Secondary | ICD-10-CM | POA: Diagnosis not present

## 2024-01-13 LAB — RAD ONC ARIA SESSION SUMMARY
Course Elapsed Days: 15
Plan Fractions Treated to Date: 11
Plan Prescribed Dose Per Fraction: 2.66 Gy
Plan Total Fractions Prescribed: 16
Plan Total Prescribed Dose: 42.56 Gy
Reference Point Dosage Given to Date: 29.26 Gy
Reference Point Session Dosage Given: 2.66 Gy
Session Number: 11

## 2024-01-14 ENCOUNTER — Ambulatory Visit
Admission: RE | Admit: 2024-01-14 | Discharge: 2024-01-14 | Disposition: A | Discharge status: Home / Self Care | Payer: Medicaid Other | Source: Ambulatory Visit | Attending: Radiation Oncology | Admitting: Radiation Oncology

## 2024-01-14 ENCOUNTER — Other Ambulatory Visit: Payer: Self-pay

## 2024-01-14 DIAGNOSIS — C50311 Malignant neoplasm of lower-inner quadrant of right female breast: Secondary | ICD-10-CM | POA: Diagnosis not present

## 2024-01-14 DIAGNOSIS — Z510 Encounter for antineoplastic radiation therapy: Secondary | ICD-10-CM | POA: Diagnosis not present

## 2024-01-14 DIAGNOSIS — Z170 Estrogen receptor positive status [ER+]: Secondary | ICD-10-CM | POA: Diagnosis not present

## 2024-01-14 LAB — RAD ONC ARIA SESSION SUMMARY
Course Elapsed Days: 16
Plan Fractions Treated to Date: 12
Plan Prescribed Dose Per Fraction: 2.66 Gy
Plan Total Fractions Prescribed: 16
Plan Total Prescribed Dose: 42.56 Gy
Reference Point Dosage Given to Date: 31.92 Gy
Reference Point Session Dosage Given: 2.66 Gy
Session Number: 12

## 2024-01-15 ENCOUNTER — Ambulatory Visit
Admission: RE | Admit: 2024-01-15 | Discharge: 2024-01-15 | Disposition: A | Discharge status: Home / Self Care | Payer: Medicaid Other | Source: Ambulatory Visit | Attending: Radiation Oncology | Admitting: Radiation Oncology

## 2024-01-15 ENCOUNTER — Other Ambulatory Visit: Payer: Self-pay

## 2024-01-15 DIAGNOSIS — Z510 Encounter for antineoplastic radiation therapy: Secondary | ICD-10-CM | POA: Diagnosis not present

## 2024-01-15 DIAGNOSIS — C50311 Malignant neoplasm of lower-inner quadrant of right female breast: Secondary | ICD-10-CM | POA: Diagnosis not present

## 2024-01-15 LAB — RAD ONC ARIA SESSION SUMMARY
Course Elapsed Days: 17
Plan Fractions Treated to Date: 13
Plan Prescribed Dose Per Fraction: 2.66 Gy
Plan Total Fractions Prescribed: 16
Plan Total Prescribed Dose: 42.56 Gy
Reference Point Dosage Given to Date: 34.58 Gy
Reference Point Session Dosage Given: 2.66 Gy
Session Number: 13

## 2024-01-16 ENCOUNTER — Other Ambulatory Visit: Payer: Self-pay | Admitting: Family Medicine

## 2024-01-16 DIAGNOSIS — Z79890 Hormone replacement therapy (postmenopausal): Secondary | ICD-10-CM

## 2024-01-16 DIAGNOSIS — K219 Gastro-esophageal reflux disease without esophagitis: Secondary | ICD-10-CM

## 2024-01-18 ENCOUNTER — Ambulatory Visit
Admission: RE | Admit: 2024-01-18 | Discharge: 2024-01-18 | Disposition: A | Discharge status: Home / Self Care | Payer: Medicaid Other | Source: Ambulatory Visit | Attending: Radiation Oncology | Admitting: Radiation Oncology

## 2024-01-18 ENCOUNTER — Other Ambulatory Visit: Payer: Self-pay

## 2024-01-18 DIAGNOSIS — C50311 Malignant neoplasm of lower-inner quadrant of right female breast: Secondary | ICD-10-CM | POA: Diagnosis not present

## 2024-01-18 DIAGNOSIS — Z170 Estrogen receptor positive status [ER+]: Secondary | ICD-10-CM | POA: Diagnosis not present

## 2024-01-18 DIAGNOSIS — Z510 Encounter for antineoplastic radiation therapy: Secondary | ICD-10-CM | POA: Diagnosis not present

## 2024-01-18 LAB — RAD ONC ARIA SESSION SUMMARY
Course Elapsed Days: 20
Plan Fractions Treated to Date: 14
Plan Prescribed Dose Per Fraction: 2.66 Gy
Plan Total Fractions Prescribed: 16
Plan Total Prescribed Dose: 42.56 Gy
Reference Point Dosage Given to Date: 37.24 Gy
Reference Point Session Dosage Given: 2.66 Gy
Session Number: 14

## 2024-01-19 ENCOUNTER — Ambulatory Visit: Payer: Medicaid Other | Admitting: Radiation Oncology

## 2024-01-19 ENCOUNTER — Ambulatory Visit
Admission: RE | Admit: 2024-01-19 | Discharge: 2024-01-19 | Disposition: A | Discharge status: Home / Self Care | Payer: Medicaid Other | Source: Ambulatory Visit | Attending: Radiation Oncology | Admitting: Radiation Oncology

## 2024-01-19 ENCOUNTER — Ambulatory Visit: Payer: Medicaid Other

## 2024-01-19 ENCOUNTER — Other Ambulatory Visit: Payer: Self-pay

## 2024-01-19 DIAGNOSIS — Z510 Encounter for antineoplastic radiation therapy: Secondary | ICD-10-CM | POA: Diagnosis not present

## 2024-01-19 DIAGNOSIS — Z170 Estrogen receptor positive status [ER+]: Secondary | ICD-10-CM | POA: Diagnosis not present

## 2024-01-19 DIAGNOSIS — C50311 Malignant neoplasm of lower-inner quadrant of right female breast: Secondary | ICD-10-CM | POA: Diagnosis not present

## 2024-01-19 LAB — RAD ONC ARIA SESSION SUMMARY
Course Elapsed Days: 21
Plan Fractions Treated to Date: 15
Plan Prescribed Dose Per Fraction: 2.66 Gy
Plan Total Fractions Prescribed: 16
Plan Total Prescribed Dose: 42.56 Gy
Reference Point Dosage Given to Date: 39.9 Gy
Reference Point Session Dosage Given: 2.66 Gy
Session Number: 15

## 2024-01-20 ENCOUNTER — Ambulatory Visit: Payer: Medicaid Other | Admitting: Radiation Oncology

## 2024-01-20 ENCOUNTER — Ambulatory Visit
Admission: RE | Admit: 2024-01-20 | Discharge: 2024-01-20 | Disposition: A | Discharge status: Home / Self Care | Payer: Medicaid Other | Source: Ambulatory Visit | Attending: Radiation Oncology | Admitting: Radiation Oncology

## 2024-01-20 ENCOUNTER — Ambulatory Visit: Payer: Medicaid Other

## 2024-01-20 ENCOUNTER — Other Ambulatory Visit: Payer: Self-pay

## 2024-01-20 DIAGNOSIS — Z510 Encounter for antineoplastic radiation therapy: Secondary | ICD-10-CM | POA: Diagnosis not present

## 2024-01-20 DIAGNOSIS — C50311 Malignant neoplasm of lower-inner quadrant of right female breast: Secondary | ICD-10-CM | POA: Diagnosis not present

## 2024-01-20 DIAGNOSIS — Z170 Estrogen receptor positive status [ER+]: Secondary | ICD-10-CM | POA: Diagnosis not present

## 2024-01-20 LAB — RAD ONC ARIA SESSION SUMMARY
Course Elapsed Days: 22
Plan Fractions Treated to Date: 16
Plan Prescribed Dose Per Fraction: 2.66 Gy
Plan Total Fractions Prescribed: 16
Plan Total Prescribed Dose: 42.56 Gy
Reference Point Dosage Given to Date: 42.56 Gy
Reference Point Session Dosage Given: 2.66 Gy
Session Number: 16

## 2024-01-21 ENCOUNTER — Ambulatory Visit: Payer: Medicaid Other

## 2024-01-21 ENCOUNTER — Other Ambulatory Visit: Payer: Self-pay

## 2024-01-21 DIAGNOSIS — C50311 Malignant neoplasm of lower-inner quadrant of right female breast: Secondary | ICD-10-CM | POA: Diagnosis not present

## 2024-01-21 DIAGNOSIS — Z510 Encounter for antineoplastic radiation therapy: Secondary | ICD-10-CM | POA: Diagnosis not present

## 2024-01-21 LAB — RAD ONC ARIA SESSION SUMMARY
Course Elapsed Days: 23
Plan Fractions Treated to Date: 1
Plan Prescribed Dose Per Fraction: 2 Gy
Plan Total Fractions Prescribed: 5
Plan Total Prescribed Dose: 10 Gy
Reference Point Dosage Given to Date: 2 Gy
Reference Point Session Dosage Given: 2 Gy
Session Number: 17

## 2024-01-22 ENCOUNTER — Other Ambulatory Visit: Payer: Self-pay

## 2024-01-22 ENCOUNTER — Ambulatory Visit
Admission: RE | Admit: 2024-01-22 | Discharge: 2024-01-22 | Disposition: A | Discharge status: Home / Self Care | Payer: Medicaid Other | Source: Ambulatory Visit | Attending: Radiation Oncology | Admitting: Radiation Oncology

## 2024-01-22 DIAGNOSIS — C50311 Malignant neoplasm of lower-inner quadrant of right female breast: Secondary | ICD-10-CM | POA: Diagnosis not present

## 2024-01-22 DIAGNOSIS — Z510 Encounter for antineoplastic radiation therapy: Secondary | ICD-10-CM | POA: Diagnosis not present

## 2024-01-22 LAB — RAD ONC ARIA SESSION SUMMARY
Course Elapsed Days: 24
Plan Fractions Treated to Date: 2
Plan Prescribed Dose Per Fraction: 2 Gy
Plan Total Fractions Prescribed: 5
Plan Total Prescribed Dose: 10 Gy
Reference Point Dosage Given to Date: 4 Gy
Reference Point Session Dosage Given: 2 Gy
Session Number: 18

## 2024-01-25 ENCOUNTER — Ambulatory Visit
Admission: RE | Admit: 2024-01-25 | Discharge: 2024-01-25 | Disposition: A | Discharge status: Home / Self Care | Payer: Medicaid Other | Source: Ambulatory Visit | Attending: Radiation Oncology

## 2024-01-25 ENCOUNTER — Other Ambulatory Visit: Payer: Self-pay

## 2024-01-25 ENCOUNTER — Ambulatory Visit: Payer: Medicaid Other

## 2024-01-25 DIAGNOSIS — C50311 Malignant neoplasm of lower-inner quadrant of right female breast: Secondary | ICD-10-CM | POA: Diagnosis not present

## 2024-01-25 DIAGNOSIS — Z510 Encounter for antineoplastic radiation therapy: Secondary | ICD-10-CM | POA: Diagnosis not present

## 2024-01-25 LAB — RAD ONC ARIA SESSION SUMMARY
Course Elapsed Days: 27
Plan Fractions Treated to Date: 3
Plan Prescribed Dose Per Fraction: 2 Gy
Plan Total Fractions Prescribed: 5
Plan Total Prescribed Dose: 10 Gy
Reference Point Dosage Given to Date: 6 Gy
Reference Point Session Dosage Given: 2 Gy
Session Number: 19

## 2024-01-26 ENCOUNTER — Ambulatory Visit
Admission: RE | Admit: 2024-01-26 | Discharge: 2024-01-26 | Disposition: A | Discharge status: Home / Self Care | Payer: Medicaid Other | Source: Ambulatory Visit | Attending: Radiation Oncology | Admitting: Radiation Oncology

## 2024-01-26 ENCOUNTER — Other Ambulatory Visit: Payer: Self-pay

## 2024-01-26 ENCOUNTER — Ambulatory Visit: Payer: Medicaid Other | Admitting: Radiation Oncology

## 2024-01-26 ENCOUNTER — Ambulatory Visit
Admission: RE | Admit: 2024-01-26 | Discharge: 2024-01-26 | Disposition: A | Discharge status: Home / Self Care | Payer: Medicaid Other | Source: Ambulatory Visit | Attending: Radiation Oncology

## 2024-01-26 DIAGNOSIS — Z170 Estrogen receptor positive status [ER+]: Secondary | ICD-10-CM | POA: Diagnosis not present

## 2024-01-26 DIAGNOSIS — Z510 Encounter for antineoplastic radiation therapy: Secondary | ICD-10-CM | POA: Diagnosis not present

## 2024-01-26 DIAGNOSIS — C50311 Malignant neoplasm of lower-inner quadrant of right female breast: Secondary | ICD-10-CM | POA: Diagnosis not present

## 2024-01-26 LAB — RAD ONC ARIA SESSION SUMMARY
Course Elapsed Days: 28
Plan Fractions Treated to Date: 4
Plan Prescribed Dose Per Fraction: 2 Gy
Plan Total Fractions Prescribed: 5
Plan Total Prescribed Dose: 10 Gy
Reference Point Dosage Given to Date: 8 Gy
Reference Point Session Dosage Given: 2 Gy
Session Number: 20

## 2024-01-27 ENCOUNTER — Other Ambulatory Visit: Payer: Self-pay

## 2024-01-27 ENCOUNTER — Ambulatory Visit
Admission: RE | Admit: 2024-01-27 | Discharge: 2024-01-27 | Disposition: A | Discharge status: Home / Self Care | Payer: Medicaid Other | Source: Ambulatory Visit | Attending: Radiation Oncology | Admitting: Radiation Oncology

## 2024-01-27 DIAGNOSIS — C50311 Malignant neoplasm of lower-inner quadrant of right female breast: Secondary | ICD-10-CM | POA: Diagnosis not present

## 2024-01-27 DIAGNOSIS — Z510 Encounter for antineoplastic radiation therapy: Secondary | ICD-10-CM | POA: Diagnosis not present

## 2024-01-27 LAB — RAD ONC ARIA SESSION SUMMARY
Course Elapsed Days: 29
Plan Fractions Treated to Date: 5
Plan Prescribed Dose Per Fraction: 2 Gy
Plan Total Fractions Prescribed: 5
Plan Total Prescribed Dose: 10 Gy
Reference Point Dosage Given to Date: 10 Gy
Reference Point Session Dosage Given: 2 Gy
Session Number: 21

## 2024-01-28 NOTE — Radiation Completion Notes (Signed)
Patient Name: Lori Wise, DEMERITT MRN: [id number] Date of Birth: [birth year] Referring Physician: Gerre Pebbles, M.D. Date of Service: 2024-01-28 Radiation Oncologist: Lance Bosch, M.D. [facility]                             RADIATION ONCOLOGY END OF TREATMENT NOTE     Diagnosis: C50.311 Malignant neoplasm of lower-inner quadrant of right female breast Staging on 2023-10-21: Malignant neoplasm of lower-inner quadrant of right breast of female, estrogen receptor positive (HCC) T=cT1c, N=cN0, M=cM0 Intent: Curative     ==========DELIVERED PLANS==========  First Treatment Date: 2023-12-29 Last Treatment Date: 2024-01-27   Plan Name: Breast_R Site: Breast, Right Technique: 3D Mode: Photon Dose Per Fraction: 2.66 Gy Prescribed Dose (Delivered / Prescribed): 42.56 Gy / 42.56 Gy Prescribed Fxs (Delivered / Prescribed): 16 / 16   Plan Name: Breast_R_Bst Site: Breast, Right Technique: Electron Mode: Electron Dose Per Fraction: 2 Gy Prescribed Dose (Delivered / Prescribed): 10 Gy / 10 Gy Prescribed Fxs (Delivered / Prescribed): 5 / 5     ==========ON TREATMENT VISIT DATES========== 2023-12-29, 2024-01-05, 2024-01-12, 2024-01-18, 2024-01-26     ==========UPCOMING VISITS========== 02/29/2024 [facility] AT [address] Waynette Buttery, PT  02/24/2024 [facility]- RAD ONC FOLLOW UP 30 Lance Bosch, MD  02/01/2024 [facility]-MED ONCOLOGY EST PT 15 Serena Croissant, MD        ==========APPENDIX - ON TREATMENT VISIT NOTES==========   See weekly On Treatment Notes in Epic for details in the Media tab (listed as Progress notes on the On Treatment Visit Dates listed above).

## 2024-01-29 ENCOUNTER — Other Ambulatory Visit: Payer: Self-pay | Admitting: Family Medicine

## 2024-01-29 DIAGNOSIS — F419 Anxiety disorder, unspecified: Secondary | ICD-10-CM

## 2024-02-01 ENCOUNTER — Inpatient Hospital Stay: Payer: Medicaid Other | Attending: Hematology and Oncology | Admitting: Hematology and Oncology

## 2024-02-01 VITALS — BP 128/43 | HR 62 | Temp 97.6°F | Resp 15 | Ht 64.0 in | Wt 138.5 lb

## 2024-02-01 DIAGNOSIS — Z79811 Long term (current) use of aromatase inhibitors: Secondary | ICD-10-CM | POA: Insufficient documentation

## 2024-02-01 DIAGNOSIS — Z885 Allergy status to narcotic agent status: Secondary | ICD-10-CM | POA: Diagnosis not present

## 2024-02-01 DIAGNOSIS — C50311 Malignant neoplasm of lower-inner quadrant of right female breast: Secondary | ICD-10-CM | POA: Insufficient documentation

## 2024-02-01 DIAGNOSIS — Z881 Allergy status to other antibiotic agents status: Secondary | ICD-10-CM | POA: Diagnosis not present

## 2024-02-01 DIAGNOSIS — Z79899 Other long term (current) drug therapy: Secondary | ICD-10-CM | POA: Diagnosis not present

## 2024-02-01 DIAGNOSIS — Z170 Estrogen receptor positive status [ER+]: Secondary | ICD-10-CM | POA: Diagnosis not present

## 2024-02-01 DIAGNOSIS — Z923 Personal history of irradiation: Secondary | ICD-10-CM | POA: Insufficient documentation

## 2024-02-01 DIAGNOSIS — Z1721 Progesterone receptor positive status: Secondary | ICD-10-CM | POA: Insufficient documentation

## 2024-02-01 MED ORDER — ANASTROZOLE 1 MG PO TABS: 1.0000 mg | ORAL_TABLET | Freq: Every day | ORAL | 3 refills | Status: AC

## 2024-02-01 NOTE — Progress Notes (Signed)
 Patient Care Team: Lori Sofia, [name] as PCP - General (Physician Assistant) Lori Ripple, DO as PCP - Cardiology (Cardiology) Lori Proud, RN as Oncology Nurse Navigator Lori Angelica, RN as Oncology Nurse Navigator Lori Croissant, MD as Consulting Physician (Hematology and Oncology) Lori Miyamoto, MD as Consulting Physician (General Surgery) Lori Blackbird, MD as Consulting Physician (Radiation Oncology)  DIAGNOSIS:  Encounter Diagnosis  Name Primary?   Malignant neoplasm of lower-inner quadrant of right breast of female, estrogen receptor positive (HCC) Yes    SUMMARY OF ONCOLOGIC HISTORY: Oncology History  Malignant neoplasm of lower-inner quadrant of right breast of female, estrogen receptor positive (HCC)  10/15/2023 Initial Diagnosis   Screening mammogram detected right breast distortion at 6:00 measuring 1.1 cm, stereotactic biopsy: Grade 1 ILC ER 95%, PR 95%, Ki-67 3%, HER2 negative, axilla negative (left breast biopsy: Benign)   10/21/2023 Cancer Staging   Staging form: Breast, AJCC 8th Edition - Clinical: Stage IA (cT1c, cN0, cM0, G1, ER+, PR+, HER2-) - Signed by Lori Croissant, MD on 10/21/2023 Stage prefix: Initial diagnosis Histologic grading system: 3 grade system    Genetic Testing   [facility] CancerNext-Expanded Panel+RNA was Negative. Report date is 11/13/2023.   The CancerNext-Expanded gene panel offered by [facility] and includes sequencing, rearrangement, and RNA analysis for the following 76 genes: AIP, ALK, APC, ATM, AXIN2, BAP1, BARD1, BMPR1A, BRCA1, BRCA2, BRIP1, CDC73, CDH1, CDK4, CDKN1B, CDKN2A, CEBPA, CHEK2, CTNNA1, DDX41, DICER1, ETV6, FH, FLCN, GATA2, LZTR1, MAX, MBD4, MEN1, MET, MLH1, MSH2, MSH3, MSH6, MUTYH, NF1, NF2, NTHL1, PALB2, PHOX2B, PMS2, POT1, PRKAR1A, PTCH1, PTEN, RAD51C, RAD51D, RB1, RET, RUNX1, SDHA, SDHAF2, SDHB, SDHC, SDHD, SMAD4, SMARCA4, SMARCB1, SMARCE1, STK11, SUFU, TMEM127, TP53, TSC1, TSC2, VHL, and WT1 (sequencing and  deletion/duplication); EGFR, HOXB13, KIT, MITF, PDGFRA, POLD1, and POLE (sequencing only); EPCAM and GREM1 (deletion/duplication only).     11/11/2023 Surgery   Right lumpectomy: Grade 1 ILC 1.4 cm with LCIS, negative for lymphovascular invasion, 0/8 lymph nodes negative, ER 95%, PR 95%, HER2 1+ negative, Ki-67 3%     CHIEF COMPLIANT: Follow-up to discuss antiestrogen therapy  HISTORY OF PRESENT ILLNESS:   History of Present Illness The patient, with a history of breast cancer and hysterectomy, presents for follow-up after recently completing radiation therapy. She reports that her breast is "hot" but is hopeful that this will improve now that radiation has finished. She successfully weaned off estrogen replacement therapy in January, without any reported mood changes or other adverse effects. She had previously tried Effexor but discontinued it due to excessive sleepiness. She also uses a topical product for her skin, which was affected by the radiation therapy.     ALLERGIES:  is allergic to codeine, macrobid [nitrofurantoin], and ativan [lorazepam].  MEDICATIONS:  Current Outpatient Medications  Medication Sig Dispense Refill   ALPRAZolam (XANAX) 0.5 MG tablet TAKE ONE TABLET BY MOUTH 3 TIMES DAILY 90 tablet 0   anastrozole (ARIMIDEX) 1 MG tablet Take 1 tablet (1 mg total) by mouth daily. 90 tablet 3   Ascorbic Acid (VITAMIN C PO) Take 1 tablet by mouth daily. Unknown strength     atenolol (TENORMIN) 25 MG tablet TAKE ONE TABLET BY MOUTH TWICE DAILY 60 tablet 5   cetirizine (ZYRTEC) 10 MG tablet TAKE ONE TABLET BY MOUTH DAILY 30 tablet 11   Cyanocobalamin (VITAMIN B-12 PO) Take 1 tablet by mouth daily. Unknown strenght     Dermatological Products, Misc. (STRATA XRT) GEL Apply topically daily.     Emollient (ROC RETINOL  CORREXION EX) Apply topically.     ezetimibe (ZETIA) 10 MG tablet TAKE ONE TABLET BY MOUTH EVERY DAY 30 tablet 5   fluticasone (FLONASE) 50 MCG/ACT nasal spray Place 2  sprays into both nostrils daily. 16 g 6   Fluticasone-Umeclidin-Vilant (TRELEGY ELLIPTA) 200-62.5-25 MCG/ACT AEPB Inhale 1 Inhalation into the lungs daily. 1 each 5   Folic Acid-Vit B6-Vit B12 (FOLBEE) 2.5-25-1 MG TABS tablet Take 1 tablet by mouth daily.     ibuprofen (ADVIL) 800 MG tablet Take 1 tablet (800 mg total) by mouth every 8 (eight) hours as needed for mild pain or moderate pain. 90 tablet 1   ipratropium-albuterol (DUONEB) 0.5-2.5 (3) MG/3ML SOLN Take 3 mLs by nebulization every 4 (four) hours as needed. 360 mL 3   levothyroxine (SYNTHROID) 25 MCG tablet TAKE ONE TABLET BY MOUTH EVERY DAY 90 tablet 1   Omega-3 1000 MG CAPS Take 1 capsule by mouth daily.     omeprazole (PRILOSEC) 20 MG capsule Take 1 capsule (20 mg total) by mouth 2 (two) times daily before a meal. 180 capsule 1   rosuvastatin (CRESTOR) 5 MG tablet Take 1 tablet (5 mg total) by mouth daily. 90 tablet 0   tretinoin (RETIN-A) 0.05 % cream Apply topically at bedtime.     VITAMIN E PO Take 1 tablet by mouth daily at 6 (six) AM. Unknown strength     albuterol (VENTOLIN HFA) 108 (90 Base) MCG/ACT inhaler Inhale 2 puffs into the lungs every 6 (six) hours as needed for wheezing or shortness of breath. (Patient not taking: Reported on 02/01/2024) 8 g 2   No current facility-administered medications for this visit.    PHYSICAL EXAMINATION: ECOG PERFORMANCE STATUS: 1 - Symptomatic but completely ambulatory  Vitals:   02/01/24 1406  BP: (!) 128/43  Pulse: 62  Resp: 15  Temp: 97.6 F (36.4 C)  SpO2: 99%   Filed Weights   02/01/24 1406  Weight: 138 lb 8 oz (62.8 kg)    Physical Exam   (exam performed in the presence of a chaperone)  LABORATORY DATA:  I have reviewed the data as listed    Latest Ref Rng & Units 01/11/2024    8:50 AM 10/21/2023   12:08 PM 08/04/2023   11:23 AM  CMP  Glucose 70 - 99 mg/dL 89  960  99   BUN 8 - 27 mg/dL 15  9  8    Creatinine 0.57 - 1.00 mg/dL 4.54  0.98  1.19   Sodium 134 - 144  mmol/L 141  143  143   Potassium 3.5 - 5.2 mmol/L 4.3  3.3  4.1   Chloride 96 - 106 mmol/L 106  110  108   CO2 20 - 29 mmol/L 21  28  22    Calcium 8.7 - 10.3 mg/dL 9.7  9.6  9.6   Total Protein 6.0 - 8.5 g/dL 6.3  6.7  6.6   Total Bilirubin 0.0 - 1.2 mg/dL 0.4  0.3  0.4   Alkaline Phos 44 - 121 IU/L 77  64  75   AST 0 - 40 IU/L 20  19  23    ALT 0 - 32 IU/L 18  14  18      Lab Results  Component Value Date   WBC 8.8 01/11/2024   HGB 13.1 01/11/2024   HCT 40.1 01/11/2024   MCV 91 01/11/2024   PLT 315 01/11/2024   NEUTROABS 4.3 01/11/2024    ASSESSMENT & PLAN:  Malignant neoplasm of  lower-inner quadrant of right breast of female, estrogen receptor positive (HCC) 10/15/2023:Screening mammogram detected right breast distortion at 6:00 measuring 1.1 cm, stereotactic biopsy: Grade 1 ILC ER 95%, PR 95%, Ki-67 3%, HER2 negative, axilla negative (left breast biopsy: Benign)   11/11/2023:Right lumpectomy: Grade 1 ILC 1.4 cm with LCIS, negative for lymphovascular invasion, 0/8 lymph nodes negative, ER 95%, PR 95%, HER2 1+ negative, Ki-67 3% Oncotype DX recurrence score: 15 (4% risk of distant recurrence)   Recommendations: 1. Adjuvant radiation therapy 12/31/2023-01/27/2024 2. Adjuvant antiestrogen therapy   Estrogen replacement therapy: Patient was able to come off of estrogen replacement therapy.   Anastrozole counseling: We discussed pros and cons of anastrozole and decided to start half a tablet today for the first 3 months.  If she tolerates it well then the dose can be increased.   Assessment & Plan Breast Cancer Post-Radiation Completed radiation therapy last week. Reports skin irritation which is expected to resolve within two weeks post-treatment. -Continue using the prescribed skin product until skin is healed.  Menopausal Symptoms Successfully discontinued estrogen supplementation in January without significant adverse effects. No longer taking Effexor due to excessive  sedation. -No further intervention required at this time.  Breast Cancer Hormone Therapy Discussed the need for anastrozole to further reduce estrogen production, which feeds the cancer. Patient has no significant contraindications. -Start anastrozole at a half-tablet daily for three months, then increase to one tablet daily if well-tolerated. -Check tolerance and effectiveness at the next appointment in three months.  General Health Maintenance / Followup Plans -Schedule a follow-up appointment in three months with the nurse practitioner for survivorship care planning, including diet, exercise, and supplement recommendations, as well as scheduling the next mammogram. -If anastrozole is well-tolerated, schedule a follow-up appointment with the doctor in six months.      No orders of the defined types were placed in this encounter.  The patient has a good understanding of the overall plan. she agrees with it. she will call with any problems that may develop before the next visit here. Total time spent: 30 mins including face to face time and time spent for planning, charting and co-ordination of care   Tamsen Meek, MD 02/01/24
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
## 2024-02-02 ENCOUNTER — Ambulatory Visit: Payer: Medicaid Other | Admitting: Radiation Oncology

## 2024-02-06 DIAGNOSIS — Z419 Encounter for procedure for purposes other than remedying health state, unspecified: Secondary | ICD-10-CM | POA: Diagnosis not present

## 2024-02-23 ENCOUNTER — Other Ambulatory Visit: Payer: Self-pay

## 2024-02-23 MED ORDER — IBUPROFEN 800 MG PO TABS: 800.0000 mg | ORAL_TABLET | Freq: Three times a day (TID) | ORAL | 1 refills | Status: DC | PRN

## 2024-02-24 ENCOUNTER — Ambulatory Visit
Admission: RE | Admit: 2024-02-24 | Discharge: 2024-02-24 | Disposition: A | Discharge status: Home / Self Care | Payer: Medicaid Other | Source: Ambulatory Visit | Attending: Radiation Oncology | Admitting: Radiation Oncology

## 2024-02-24 VITALS — BP 109/53 | HR 68 | Temp 97.8°F | Resp 18 | Ht 64.0 in | Wt 138.6 lb

## 2024-02-24 DIAGNOSIS — R5383 Other fatigue: Secondary | ICD-10-CM | POA: Insufficient documentation

## 2024-02-24 DIAGNOSIS — C50311 Malignant neoplasm of lower-inner quadrant of right female breast: Secondary | ICD-10-CM | POA: Diagnosis not present

## 2024-02-24 DIAGNOSIS — Z170 Estrogen receptor positive status [ER+]: Secondary | ICD-10-CM | POA: Diagnosis not present

## 2024-02-24 DIAGNOSIS — Z923 Personal history of irradiation: Secondary | ICD-10-CM | POA: Diagnosis not present

## 2024-02-24 DIAGNOSIS — Z79811 Long term (current) use of aromatase inhibitors: Secondary | ICD-10-CM | POA: Diagnosis not present

## 2024-02-24 NOTE — Progress Notes (Signed)
 FOLLOW-UP NOTE  Diagnosis: Invasive carcinoma of the right breast, postlumpectomy  HPI: The patient completed adjuvant right breast radiation approximately 4 weeks ago.  This is her first follow-up visit.  She continues to be followed by her medical oncologist, Dr. Pamelia Hoit.  She has started treatment with anastrozole.  ROS: She reports mild to moderate fatigue currently.  She reports no pain in her right breast, but does note an occasional tingling sensation involving her nipple.  She reports no nipple bleeding or discharge.  PE: She is in no apparent distress.  Examination of her right breast reveals resolving hyperpigmentation.  No erythema is appreciated.  A/P: She is doing well approximately 1 month out from completion of adjuvant right breast radiation.  Her treatment related side effects have largely resolved.  She has commenced treatment with anastrozole.  She is scheduled to see Dr. Pamelia Hoit in late May.  She will continue to follow-up routinely with Dr. Pamelia Hoit, further follow-up in our department will be on a as needed basis, though I encouraged her to contact me at anytime with any questions or concerns she may have.    Trisa Cranor A. Thersa Salt, MD

## 2024-02-27 NOTE — Therapy (Signed)
 OUTPATIENT PHYSICAL THERAPY SOZO SCREENING NOTE   Patient Name: Lori Wise MRN: [id number] DOB:02/28/1961, 63 y.o., female Today's Date: 02/29/2024  PCP: Marianne Sofia, [name] REFERRING PROVIDER: Abigail Miyamoto, MD   PT End of Session - 02/29/24 1430     Visit Number 2   unchanged due to screen   PT Start Time 1431    PT Stop Time 1434    PT Time Calculation (min) 3 min    Activity Tolerance Patient tolerated treatment well    Behavior During Therapy [facility] for tasks assessed/performed             Past Medical History:  Diagnosis Date   Acute laryngopharyngitis 04/19/2020   Anxiety 04/19/2020   Atypical nevus 04/19/2020   Bronchitis 04/30/2020   Chronic obstructive pulmonary disease (HCC) 10/22/2020   Chronic obstructive pulmonary disease with (acute) lower respiratory infection (HCC)    Encounter for immunization 04/19/2020   Essential (primary) hypertension    Gastroesophageal reflux disease 10/22/2020   Hormone replacement therapy (HRT) 10/22/2020   Malignant neoplasm of lower-inner quadrant of right female breast (HCC)    Migraine without aura, not intractable, without status migrainosus    Mixed hyperlipidemia    Other chest pain 03/21/2021   Other specified hypothyroidism 04/19/2020   Primary hypertension 03/21/2021   Right leg pain 07/23/2020   Past Surgical History:  Procedure Laterality Date   ABDOMINAL HYSTERECTOMY     BREAST BIOPSY Left 10/15/2023   MM LT BREAST BX W LOC DEV 1ST LESION IMAGE BX SPEC STEREO GUIDE 10/15/2023 [facility]   BREAST BIOPSY Right 10/15/2023   MM RT BREAST BX W LOC DEV 1ST LESION IMAGE BX SPEC STEREO GUIDE 10/15/2023 [facility]   BREAST BIOPSY  11/10/2023   MM RT RADIOACTIVE SEED LOC MAMMO GUIDE 11/10/2023 [facility]   BREAST LUMPECTOMY WITH RADIOACTIVE SEED AND SENTINEL LYMPH NODE BIOPSY Right 11/11/2023   Procedure: RIGHT BREAST SEED LOCALIZED  LUMPECTOMY AND SENTINEL NODE BIOPSY;  Surgeon: Abigail Miyamoto,  MD;  Location:  SURGERY CENTER;  Service: General;  Laterality: Right;   CARDIAC CATHETERIZATION  2000   normal   CHOLECYSTECTOMY     NASAL HEMORRHAGE CONTROL     OTHER SURGICAL HISTORY     Ear surgeries x5   Patient Active Problem List   Diagnosis Date Noted   Hyperglycemia 01/11/2024   Genetic testing 11/04/2023   Malignant neoplasm of lower-inner quadrant of right breast of female, estrogen receptor positive (HCC) 10/19/2023   Epistaxis 05/06/2023   Acute cystitis with hematuria 04/16/2023   Neck pain 04/16/2023   Tobacco abuse 01/28/2023   Allergic rhinitis due to allergen 04/15/2022   Need for pneumococcal 20-valent conjugate vaccination 04/15/2022   Vitamin D deficiency 04/15/2022   Smoking 08/30/2021   Chronic obstructive pulmonary disease with (acute) lower respiratory infection (HCC)    Essential (primary) hypertension    Migraine without aura, not intractable, without status migrainosus    Sunburn of second degree    Atypical chest pain 03/21/2021   Primary hypertension 03/21/2021   Chronic obstructive pulmonary disease (HCC) 10/22/2020   Hormone replacement therapy (HRT) 10/22/2020   GERD without esophagitis 10/22/2020   Right leg pain 07/23/2020   Bronchitis 04/30/2020   Mixed hyperlipidemia 04/19/2020   Other specified hypothyroidism 04/19/2020   Anxiety 04/19/2020   Acute laryngopharyngitis 04/19/2020   Encounter for immunization 04/19/2020   Atypical nevus 04/19/2020    REFERRING DIAG: right breast cancer at risk for lymphedema  THERAPY DIAG:  Aftercare following surgery for neoplasm  PERTINENT HISTORY:  Patient was diagnosed on 09/16/2023 with 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 It measures 1.1 cm and is located in the lower inner quadrant. It is ER/PR positive and HER2 negative with a Ki67 of 3%. Rt lumpectomy and SLNB on 11/11/23 with 8 negative nodes removed.   PRECAUTIONS: right UE Lymphedema risk,   SUBJECTIVE:    PAIN:  Are you having pain?   SOZO SCREENING: Patient was assessed today using the SOZO machine to determine the lymphedema index score. This was compared to her baseline score. It was determined that she is within the recommended range when compared to her baseline and no further action is needed at this time. She will continue SOZO screenings. These are done every 3 months for 2 years post operatively followed by every 6 months for 2 years, and then annually.   L-DEX FLOWSHEETS - 02/29/24 1400       L-DEX LYMPHEDEMA SCREENING   Measurement Type Unilateral    L-DEX MEASUREMENT EXTREMITY Upper Extremity    POSITION  Standing    DOMINANT SIDE Right    At Risk Side Right    BASELINE SCORE (UNILATERAL) 0.6    L-DEX SCORE (UNILATERAL) -1.5    VALUE CHANGE (UNILAT) -2.1               Waynette Buttery, PT 02/29/2024, 2:34 PM

## 2024-02-29 ENCOUNTER — Ambulatory Visit: Payer: Medicaid Other | Attending: Surgery

## 2024-02-29 ENCOUNTER — Other Ambulatory Visit: Payer: Self-pay | Admitting: Family Medicine

## 2024-02-29 ENCOUNTER — Other Ambulatory Visit: Payer: Self-pay | Admitting: Physician Assistant

## 2024-02-29 DIAGNOSIS — F419 Anxiety disorder, unspecified: Secondary | ICD-10-CM

## 2024-02-29 DIAGNOSIS — Z483 Aftercare following surgery for neoplasm: Secondary | ICD-10-CM | POA: Insufficient documentation

## 2024-02-29 DIAGNOSIS — E782 Mixed hyperlipidemia: Secondary | ICD-10-CM

## 2024-03-15 DIAGNOSIS — C50811 Malignant neoplasm of overlapping sites of right female breast: Secondary | ICD-10-CM | POA: Diagnosis not present

## 2024-03-19 DIAGNOSIS — Z419 Encounter for procedure for purposes other than remedying health state, unspecified: Secondary | ICD-10-CM | POA: Diagnosis not present

## 2024-03-29 ENCOUNTER — Other Ambulatory Visit: Payer: Self-pay | Admitting: Physician Assistant

## 2024-03-29 DIAGNOSIS — F419 Anxiety disorder, unspecified: Secondary | ICD-10-CM

## 2024-04-18 DIAGNOSIS — Z419 Encounter for procedure for purposes other than remedying health state, unspecified: Secondary | ICD-10-CM | POA: Diagnosis not present

## 2024-04-28 ENCOUNTER — Other Ambulatory Visit: Payer: Self-pay | Admitting: Physician Assistant

## 2024-04-28 DIAGNOSIS — F419 Anxiety disorder, unspecified: Secondary | ICD-10-CM

## 2024-05-05 ENCOUNTER — Inpatient Hospital Stay: Payer: Medicaid Other | Attending: Adult Health | Admitting: Adult Health

## 2024-05-05 ENCOUNTER — Encounter: Payer: Self-pay | Admitting: Adult Health

## 2024-05-05 ENCOUNTER — Inpatient Hospital Stay

## 2024-05-05 VITALS — BP 135/58 | HR 73 | Temp 98.0°F | Resp 17 | Wt 149.2 lb

## 2024-05-05 DIAGNOSIS — Z170 Estrogen receptor positive status [ER+]: Secondary | ICD-10-CM | POA: Diagnosis not present

## 2024-05-05 DIAGNOSIS — I10 Essential (primary) hypertension: Secondary | ICD-10-CM | POA: Insufficient documentation

## 2024-05-05 DIAGNOSIS — Z801 Family history of malignant neoplasm of trachea, bronchus and lung: Secondary | ICD-10-CM | POA: Insufficient documentation

## 2024-05-05 DIAGNOSIS — J449 Chronic obstructive pulmonary disease, unspecified: Secondary | ICD-10-CM | POA: Insufficient documentation

## 2024-05-05 DIAGNOSIS — M7989 Other specified soft tissue disorders: Secondary | ICD-10-CM | POA: Insufficient documentation

## 2024-05-05 DIAGNOSIS — Z7982 Long term (current) use of aspirin: Secondary | ICD-10-CM | POA: Diagnosis not present

## 2024-05-05 DIAGNOSIS — Z803 Family history of malignant neoplasm of breast: Secondary | ICD-10-CM | POA: Diagnosis not present

## 2024-05-05 DIAGNOSIS — E038 Other specified hypothyroidism: Secondary | ICD-10-CM | POA: Insufficient documentation

## 2024-05-05 DIAGNOSIS — Z87891 Personal history of nicotine dependence: Secondary | ICD-10-CM | POA: Insufficient documentation

## 2024-05-05 DIAGNOSIS — C50311 Malignant neoplasm of lower-inner quadrant of right female breast: Secondary | ICD-10-CM | POA: Insufficient documentation

## 2024-05-05 DIAGNOSIS — E782 Mixed hyperlipidemia: Secondary | ICD-10-CM | POA: Diagnosis not present

## 2024-05-05 DIAGNOSIS — Z8042 Family history of malignant neoplasm of prostate: Secondary | ICD-10-CM | POA: Insufficient documentation

## 2024-05-05 DIAGNOSIS — Z1732 Human epidermal growth factor receptor 2 negative status: Secondary | ICD-10-CM | POA: Diagnosis not present

## 2024-05-05 DIAGNOSIS — Z808 Family history of malignant neoplasm of other organs or systems: Secondary | ICD-10-CM | POA: Insufficient documentation

## 2024-05-05 DIAGNOSIS — Z90710 Acquired absence of both cervix and uterus: Secondary | ICD-10-CM | POA: Insufficient documentation

## 2024-05-05 DIAGNOSIS — Z881 Allergy status to other antibiotic agents status: Secondary | ICD-10-CM | POA: Diagnosis not present

## 2024-05-05 DIAGNOSIS — Z818 Family history of other mental and behavioral disorders: Secondary | ICD-10-CM | POA: Insufficient documentation

## 2024-05-05 DIAGNOSIS — Z13820 Encounter for screening for osteoporosis: Secondary | ICD-10-CM | POA: Diagnosis not present

## 2024-05-05 DIAGNOSIS — Z1721 Progesterone receptor positive status: Secondary | ICD-10-CM | POA: Diagnosis not present

## 2024-05-05 DIAGNOSIS — F419 Anxiety disorder, unspecified: Secondary | ICD-10-CM | POA: Diagnosis not present

## 2024-05-05 DIAGNOSIS — Z79811 Long term (current) use of aromatase inhibitors: Secondary | ICD-10-CM | POA: Diagnosis not present

## 2024-05-05 DIAGNOSIS — Z9049 Acquired absence of other specified parts of digestive tract: Secondary | ICD-10-CM | POA: Insufficient documentation

## 2024-05-05 DIAGNOSIS — Z885 Allergy status to narcotic agent status: Secondary | ICD-10-CM | POA: Insufficient documentation

## 2024-05-05 DIAGNOSIS — Z79899 Other long term (current) drug therapy: Secondary | ICD-10-CM | POA: Diagnosis not present

## 2024-05-05 LAB — CMP (CANCER CENTER ONLY)
ALT: 25 U/L (ref 0–44)
AST: 27 U/L (ref 15–41)
Albumin: 4.4 g/dL (ref 3.5–5.0)
Alkaline Phosphatase: 71 U/L (ref 38–126)
Anion gap: 6 (ref 5–15)
BUN: 20 mg/dL (ref 8–23)
CO2: 30 mmol/L (ref 22–32)
Calcium: 10.1 mg/dL (ref 8.9–10.3)
Chloride: 106 mmol/L (ref 98–111)
Creatinine: 0.77 mg/dL (ref 0.44–1.00)
GFR, Estimated: >60 mL/min (ref >60)
Glucose, Bld: 91 mg/dL (ref 70–99)
Potassium: 4.2 mmol/L (ref 3.5–5.1)
Sodium: 142 mmol/L (ref 135–145)
Total Bilirubin: 0.3 mg/dL (ref 0.0–1.2)
Total Protein: 7 g/dL (ref 6.5–8.1)

## 2024-05-05 LAB — CBC WITH DIFFERENTIAL (CANCER CENTER ONLY)
Abs Immature Granulocytes: 0.08 10*3/uL — ABNORMAL HIGH (ref 0.00–0.07)
Basophils Absolute: 0.1 10*3/uL (ref 0.0–0.1)
Basophils Relative: 1 %
Eosinophils Absolute: 0.3 10*3/uL (ref 0.0–0.5)
Eosinophils Relative: 3 %
HCT: 35.5 % — ABNORMAL LOW (ref 36.0–46.0)
Hemoglobin: 12.1 g/dL (ref 12.0–15.0)
Immature Granulocytes: 1 %
Lymphocytes Relative: 30 %
Lymphs Abs: 3.3 10*3/uL (ref 0.7–4.0)
MCH: 30.5 pg (ref 26.0–34.0)
MCHC: 34.1 g/dL (ref 30.0–36.0)
MCV: 89.4 fL (ref 80.0–100.0)
Monocytes Absolute: 1.1 10*3/uL — ABNORMAL HIGH (ref 0.1–1.0)
Monocytes Relative: 10 %
Neutro Abs: 6 10*3/uL (ref 1.7–7.7)
Neutrophils Relative %: 55 %
Platelet Count: 310 10*3/uL (ref 150–400)
RBC: 3.97 MIL/uL (ref 3.87–5.11)
RDW: 13 % (ref 11.5–15.5)
WBC Count: 10.9 10*3/uL — ABNORMAL HIGH (ref 4.0–10.5)
nRBC: 0 % (ref 0.0–0.2)

## 2024-05-05 LAB — MAGNESIUM: Magnesium: 2.1 mg/dL (ref 1.7–2.4)

## 2024-05-05 NOTE — Progress Notes (Unsigned)
 SURVIVORSHIP VISIT:  BRIEF ONCOLOGIC HISTORY:  Oncology History  Malignant neoplasm of lower-inner quadrant of right breast of female, estrogen receptor positive (HCC)  10/15/2023 Initial Diagnosis   Screening mammogram detected right breast distortion at 6:00 measuring 1.1 cm, stereotactic biopsy: Grade 1 ILC ER 95%, PR 95%, Ki-67 3%, HER2 negative, axilla negative (left breast biopsy: Benign)   10/21/2023 Cancer Staging   Staging form: Breast, AJCC 8th Edition - Clinical: Stage IA (cT1c, cN0, cM0, G1, ER+, PR+, HER2-) - Signed by Cameron Cea, MD on 10/21/2023 Stage prefix: Initial diagnosis Histologic grading system: 3 grade system    Genetic Testing   [facility] CancerNext-Expanded Panel+RNA was Negative. Report date is 11/13/2023.   The CancerNext-Expanded gene panel offered by [facility] and includes sequencing, rearrangement, and RNA analysis for the following 76 genes: AIP, ALK, APC, ATM, AXIN2, BAP1, BARD1, BMPR1A, BRCA1, BRCA2, BRIP1, CDC73, CDH1, CDK4, CDKN1B, CDKN2A, CEBPA, CHEK2, CTNNA1, DDX41, DICER1, ETV6, FH, FLCN, GATA2, LZTR1, MAX, MBD4, MEN1, MET, MLH1, MSH2, MSH3, MSH6, MUTYH, NF1, NF2, NTHL1, PALB2, PHOX2B, PMS2, POT1, PRKAR1A, PTCH1, PTEN, RAD51C, RAD51D, RB1, RET, RUNX1, SDHA, SDHAF2, SDHB, SDHC, SDHD, SMAD4, SMARCA4, SMARCB1, SMARCE1, STK11, SUFU, TMEM127, TP53, TSC1, TSC2, VHL, and WT1 (sequencing and deletion/duplication); EGFR, HOXB13, KIT, MITF, PDGFRA, POLD1, and POLE (sequencing only); EPCAM and GREM1 (deletion/duplication only).     11/11/2023 Surgery   Right lumpectomy: Grade 1 ILC 1.4 cm with LCIS, negative for lymphovascular invasion, 0/8 lymph nodes negative, ER 95%, PR 95%, HER2 1+ negative, Ki-67 3%   12/29/2023 - 01/27/2024 Radiation Therapy   First Treatment Date: 2023-12-29 Last Treatment Date: 2024-01-27   Plan Name: Breast_R Site: Breast, Right Technique: 3D Mode: Photon Dose Per Fraction: 2.66 Gy Prescribed Dose (Delivered / Prescribed): 42.56 Gy /  42.56 Gy Prescribed Fxs (Delivered / Prescribed): 16 / 16   Plan Name: Breast_R_Bst Site: Breast, Right Technique: Electron Mode: Electron Dose Per Fraction: 2 Gy Prescribed Dose (Delivered / Prescribed): 10 Gy / 10 Gy Prescribed Fxs (Delivered / Prescribed): 5 / 5   01/2024 -  Anti-estrogen oral therapy   Anastrozole      INTERVAL HISTORY:  Ms. Beem to review her survivorship care plan detailing her treatment course for breast cancer, as well as monitoring long-term side effects of that treatment, education regarding health maintenance, screening, and overall wellness and health promotion.     Overall, Ms. Cina reports feeling quite well.  She is taking anastrozole  and has some fatigue but is otherwise tolerating it moderately well.  She has some right arm swelling and discomfort with ROM.    REVIEW OF SYSTEMS:  Review of Systems  Constitutional:  Positive for fatigue. Negative for appetite change, chills, fever and unexpected weight change.  HENT:   Negative for hearing loss, lump/mass and trouble swallowing.   Eyes:  Negative for eye problems and icterus.  Respiratory:  Negative for chest tightness, cough and shortness of breath.   Cardiovascular:  Negative for chest pain, leg swelling and palpitations.  Gastrointestinal:  Negative for abdominal distention, abdominal pain, constipation, diarrhea, nausea and vomiting.  Endocrine: Negative for hot flashes.  Genitourinary:  Negative for difficulty urinating.   Musculoskeletal:  Negative for arthralgias.  Skin:  Negative for itching and rash.  Neurological:  Negative for dizziness, extremity weakness, headaches and numbness.  Hematological:  Negative for adenopathy. Does not bruise/bleed easily.  Psychiatric/Behavioral:  Negative for depression. The patient is not nervous/anxious.    Breast: Denies any new nodularity, masses, tenderness, nipple changes, or nipple  discharge.       PAST MEDICAL/SURGICAL HISTORY:  Past Medical  History:  Diagnosis Date   Acute laryngopharyngitis 04/19/2020   Anxiety 04/19/2020   Atypical nevus 04/19/2020   Bronchitis 04/30/2020   Chronic obstructive pulmonary disease (HCC) 10/22/2020   Chronic obstructive pulmonary disease with (acute) lower respiratory infection (HCC)    Encounter for immunization 04/19/2020   Essential (primary) hypertension    Gastroesophageal reflux disease 10/22/2020   Hormone replacement therapy (HRT) 10/22/2020   Malignant neoplasm of lower-inner quadrant of right female breast (HCC)    Migraine without aura, not intractable, without status migrainosus    Mixed hyperlipidemia    Other chest pain 03/21/2021   Other specified hypothyroidism 04/19/2020   Primary hypertension 03/21/2021   Right leg pain 07/23/2020   Past Surgical History:  Procedure Laterality Date   ABDOMINAL HYSTERECTOMY     BREAST BIOPSY Left 10/15/2023   MM LT BREAST BX W LOC DEV 1ST LESION IMAGE BX SPEC STEREO GUIDE 10/15/2023 [facility]   BREAST BIOPSY Right 10/15/2023   MM RT BREAST BX W LOC DEV 1ST LESION IMAGE BX SPEC STEREO GUIDE 10/15/2023 [facility]   BREAST BIOPSY  11/10/2023   MM RT RADIOACTIVE SEED LOC MAMMO GUIDE 11/10/2023 [facility]   BREAST LUMPECTOMY WITH RADIOACTIVE SEED AND SENTINEL LYMPH NODE BIOPSY Right 11/11/2023   Procedure: RIGHT BREAST SEED LOCALIZED  LUMPECTOMY AND SENTINEL NODE BIOPSY;  Surgeon: Oza Blumenthal, MD;  Location: [facility];  Service: General;  Laterality: Right;   CARDIAC CATHETERIZATION  2000   normal   CHOLECYSTECTOMY     NASAL HEMORRHAGE CONTROL     OTHER SURGICAL HISTORY     Ear surgeries x5     ALLERGIES:  Allergies  Allergen Reactions   Codeine Hives, Itching, Other (See Comments), Rash and Nausea And Vomiting   Macrobid  [Nitrofurantoin ] Other (See Comments)    GI intolerance   Ativan [Lorazepam] Other (See Comments)    agitation     CURRENT MEDICATIONS:  Outpatient Encounter  Medications as of 05/05/2024  Medication Sig   albuterol  (VENTOLIN  HFA) 108 (90 Base) MCG/ACT inhaler Inhale 2 puffs into the lungs every 6 (six) hours as needed for wheezing or shortness of breath.   ALPRAZolam  (XANAX ) 0.5 MG tablet TAKE ONE TABLET BY MOUTH 3 TIMES DAILY   anastrozole  (ARIMIDEX ) 1 MG tablet Take 1 tablet (1 mg total) by mouth daily. (Patient taking differently: Take 1 mg by mouth daily. Take 1/2 pill daily)   Ascorbic Acid (VITAMIN C PO) Take 1 tablet by mouth daily. Unknown strength   aspirin EC 81 MG tablet Take 81 mg by mouth daily. Swallow whole.   atenolol  (TENORMIN ) 25 MG tablet TAKE ONE TABLET BY MOUTH TWICE DAILY   cetirizine  (ZYRTEC ) 10 MG tablet TAKE ONE TABLET BY MOUTH DAILY   Cholecalciferol (VITAMIN D3) 25 MCG (1000 UT) CAPS Take 1 Units by mouth daily.   Cyanocobalamin (VITAMIN B-12 PO) Take 1 tablet by mouth daily. Unknown strenght   Dermatological Products, Misc. (STRATA XRT) GEL Apply topically daily.   Emollient (ROC RETINOL CORREXION EX) Apply topically.   ezetimibe  (ZETIA ) 10 MG tablet TAKE ONE TABLET BY MOUTH EVERY DAY   fluticasone  (FLONASE ) 50 MCG/ACT nasal spray Place 2 sprays into both nostrils daily.   Fluticasone -Umeclidin-Vilant (TRELEGY ELLIPTA ) 200-62.5-25 MCG/ACT AEPB Inhale 1 Inhalation into the lungs daily.   Folic Acid-Vit B6-Vit B12 (FOLBEE) 2.5-25-1 MG TABS tablet Take 1 tablet by mouth daily.   ibuprofen  (  ADVIL ) 800 MG tablet Take 1 tablet (800 mg total) by mouth every 8 (eight) hours as needed for mild pain (pain score 1-3) or moderate pain (pain score 4-6).   ipratropium-albuterol  (DUONEB) 0.5-2.5 (3) MG/3ML SOLN Take 3 mLs by nebulization every 4 (four) hours as needed.   levothyroxine (SYNTHROID) 25 MCG tablet TAKE ONE TABLET BY MOUTH EVERY DAY   Omega-3 1000 MG CAPS Take 1 capsule by mouth daily.   omeprazole  (PRILOSEC) 20 MG capsule Take 1 capsule (20 mg total) by mouth 2 (two) times daily before a meal.   rosuvastatin  (CRESTOR ) 5 MG  tablet Take 1 tablet (5 mg total) by mouth daily.   tretinoin (RETIN-A) 0.05 % cream Apply topically at bedtime.   VITAMIN E PO Take 1 tablet by mouth daily at 6 (six) AM. Unknown strength   No facility-administered encounter medications on file as of 05/05/2024.     ONCOLOGIC FAMILY HISTORY:  Family History  Problem Relation Age of Onset   Suicidality Mother    Lung cancer Father 49       smoked   Lung cancer Sister 50       smoked   Brain cancer Brother 91   Lung cancer Brother 5       smoked   Breast cancer Paternal Aunt    Prostate cancer Paternal Uncle 60 - 99   Breast cancer Paternal Grandmother 65 - 36     SOCIAL HISTORY:  Social History   Socioeconomic History   Marital status: Legally Separated    Spouse name: Not on file   Number of children: 2   Years of education: Not on file   Highest education level: 12th grade  Occupational History   Occupation: [employer] service  Tobacco Use   Smoking status: Former    Current packs/day: 0.00    Types: Cigarettes    Quit date: 05/28/2023    Years since quitting: 0.9   Smokeless tobacco: Never  Vaping Use   Vaping status: Never Used  Substance and Sexual Activity   Alcohol use: Not Currently    Comment: Drinks approximately two times per week.   Drug use: Never   Sexual activity: Not on file  Other Topics Concern   Not on file  Social History Narrative   Not on file   Social Drivers of Health   Financial Resource Strain: Low Risk  (08/04/2023)   Overall Financial Resource Strain (CARDIA)    Difficulty of Paying Living Expenses: Not hard at all  Food Insecurity: No Food Insecurity (10/21/2023)   Hunger Vital Sign    Worried About Running Out of Food in the Last Year: Never true    Ran Out of Food in the Last Year: Never true  Transportation Needs: No Transportation Needs (10/21/2023)   PRAPARE - Administrator, Civil Service (Medical): No    Lack of Transportation (Non-Medical): No  Physical  Activity: Inactive (08/04/2023)   Exercise Vital Sign    Days of Exercise per Week: 0 days    Minutes of Exercise per Session: 0 min  Stress: No Stress Concern Present (08/04/2023)   [employer] of Occupational Health - Occupational Stress Questionnaire    Feeling of Stress : Not at all  Social Connections: Unknown (08/04/2023)   Social Connection and Isolation Panel [NHANES]    Frequency of Communication with Friends and Family: More than three times a week    Frequency of Social Gatherings with Friends and Family: Three  times a week    Attends Religious Services: Patient declined    Active Member of Clubs or Organizations: No    Attends Banker Meetings: Never    Marital Status: Patient declined  Intimate Partner Violence: Not At Risk (10/21/2023)   Humiliation, Afraid, Rape, and Kick questionnaire    Fear of Current or Ex-Partner: No    Emotionally Abused: No    Physically Abused: No    Sexually Abused: No     OBSERVATIONS/OBJECTIVE:  BP (!) 135/58 (Patient Position: Sitting)   Pulse 73   Temp 98 F (36.7 C) (Temporal)   Resp 17   Wt 149 lb 3.2 oz (67.7 kg)   SpO2 100%   BMI 25.61 kg/m  GENERAL: Patient is a well appearing female in no acute distress HEENT:  Sclerae anicteric.  Oropharynx clear and moist. No ulcerations or evidence of oropharyngeal candidiasis. Neck is supple.  NODES:  No cervical, supraclavicular, or axillary lymphadenopathy palpated.  BREAST EXAM: Right breast status postlumpectomy and radiation no sign of local recurrence left breast is benign. LUNGS:  Clear to auscultation bilaterally.  No wheezes or rhonchi. HEART:  Regular rate and rhythm. No murmur appreciated. ABDOMEN:  Soft, nontender.  Positive, normoactive bowel sounds. No organomegaly palpated. MSK:  No focal spinal tenderness to palpation. Full range of motion bilaterally in the upper extremities. EXTREMITIES:  No peripheral edema.   SKIN:  Clear with no obvious rashes or  skin changes. No nail dyscrasia. NEURO:  Nonfocal. Well oriented.  Appropriate affect.   LABORATORY DATA:  None for this visit.  DIAGNOSTIC IMAGING:  None for this visit.      ASSESSMENT AND PLAN:  Ms.. Benegas is a pleasant 63 y.o. 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  She presents to the Survivorship Clinic for our initial meeting and routine follow-up post-completion of treatment for breast cancer.    1. Stage IA right breast cancer:  Ms. Wegener is continuing to recover from definitive treatment for breast cancer. She will follow-up with her medical oncologist, Dr.  Lee Public in 3 months with history and physical exam per surveillance protocol.  She will continue her anti-estrogen therapy with Anastrozole . Thus far, she is tolerating the Anastrozole  well, with minimal side effects. Her mammogram is due 09/2024; orders placed today.   Today, a comprehensive survivorship care plan and treatment summary was reviewed with the patient today detailing her breast cancer diagnosis, treatment course, potential late/long-term effects of treatment, appropriate follow-up care with recommendations for the future, and patient education resources.  A copy of this summary, along with a letter will be sent to the patient's primary care provider via mail/fax/In Basket message after today's visit.    2. Right arm swelling and shoulder pain: PT referral placed for evaluation and management  3. Bone health:  Given Ms. Robarts's age/history of breast cancer and her current treatment regimen including anti-estrogen therapy with anastrozole , she is at risk for bone demineralization. Bone density testing ordered. She was given education on specific activities to promote bone health.  4. Cancer screening:  Due to Ms. Reesman's history and her age, she  should receive screening for skin cancers, colon cancer, and gynecologic cancers.  The information and recommendations are listed on the patient's comprehensive care plan/treatment summary and were reviewed in detail with the patient.    Lung cancer screening.   I  discussed screening Low Dose Ct chest without contrast for early detection of lung cancer in this Counseling and Shared Decision-Making Visit Patient Conny Situ Patriarca with 14-Feb-1961 and Age 84 y.o. years met the following criteria and I counseled that in  A) age 53-75 AND B) smoking history of 20 pack year smoking AND C) Current smoker or one who has quit smoking within the last 15 years  I counseled  - Annual low dose CT chest can pick up lung cancer early and has potential to save lives and cure lung cancer - This is similar in concept to screening mammogram, colonoscopies and pap smears - I explained Ct scan chest is low dose radiation CT chest - I explained early lung cancer asymptomatic and only way to  detect is CT  With the real advantage that early lung cancer is curable through radiation or surgery - I explained CT superior to CXR - I explained that false positives are present and can incur cost and workup like biopsies, additional scan but benefit outweighs risk - I counseled that patient should MAINTAIN REMISSION - I counseled that patient should adhered to protocol requirements of scan and followup scans   6. Health maintenance and wellness promotion: Ms. Wardle was encouraged to consume 5-7 servings of fruits and vegetables per day. We reviewed the "Nutrition Rainbow" handout.  She was also encouraged to engage in moderate to vigorous exercise for 30 minutes per day most days of the week.  She was instructed to limit her alcohol consumption and continue to abstain from tobacco use.     7. Support services/counseling: It is not uncommon for this period of the patient's cancer care trajectory to be one of many emotions and  stressors.   She was given information regarding our available services and encouraged to contact me with any questions or for help enrolling in any of our support group/programs.    Follow up instructions:    -Return to cancer center 3 months for f/u with Dr. Gudena -Mammogram due in 09/2023 -Lung cancer screening ordered -DEXA ordered -PT referral placed -See LC in one year for f/u and lung cancer screening shared decision making -She is welcome to return back to the Survivorship Clinic at any time; no additional follow-up needed at this time.  -Consider referral back to survivorship as a long-term survivor for continued surveillance  The patient was provided an opportunity to ask questions and all were answered. The patient agreed with the plan and demonstrated an understanding of the instructions.   Total encounter time:45 minutes*in face-to-face visit time, chart review, lab review, care coordination, order entry, and documentation of the encounter time.    Alwin Baars, NP 05/05/24 2:09 PM Medical Oncology and Hematology [facility] [address] [place], [place] [address] Tel. [phone]    Fax. [phone]  *Total Encounter Time as defined by the Centers for Medicare and Medicaid Services includes, in addition to the face-to-face time of a patient visit (documented in the note above) non-face-to-face time: obtaining and reviewing outside history, ordering and reviewing medications, tests or procedures, care coordination (communications with other health care professionals or caregivers) and documentation in the medical record.

## 2024-05-06 ENCOUNTER — Ambulatory Visit: Payer: Self-pay

## 2024-05-06 NOTE — Telephone Encounter (Signed)
 Called pt and she was given message. Verbalized thanks and understanding.

## 2024-05-06 NOTE — Telephone Encounter (Signed)
-----   Message from Conway Dennis sent at 05/06/2024 10:00 AM EDT ----- Will you give patient normal results ----- Message ----- From: Dannis Dy, Lab [facility] [place] Sent: 05/05/2024   2:52 PM EDT To: Percival Brace, NP

## 2024-05-12 ENCOUNTER — Encounter: Payer: Self-pay | Admitting: Physician Assistant

## 2024-05-12 ENCOUNTER — Ambulatory Visit (INDEPENDENT_AMBULATORY_CARE_PROVIDER_SITE_OTHER): Payer: Medicaid Other | Admitting: Physician Assistant

## 2024-05-12 VITALS — BP 106/68 | HR 82 | Temp 97.8°F | Resp 18 | Ht 64.0 in | Wt 150.6 lb

## 2024-05-12 DIAGNOSIS — E559 Vitamin D deficiency, unspecified: Secondary | ICD-10-CM

## 2024-05-12 DIAGNOSIS — I10 Essential (primary) hypertension: Secondary | ICD-10-CM

## 2024-05-12 DIAGNOSIS — L309 Dermatitis, unspecified: Secondary | ICD-10-CM

## 2024-05-12 DIAGNOSIS — K219 Gastro-esophageal reflux disease without esophagitis: Secondary | ICD-10-CM

## 2024-05-12 DIAGNOSIS — R7303 Prediabetes: Secondary | ICD-10-CM

## 2024-05-12 DIAGNOSIS — E782 Mixed hyperlipidemia: Secondary | ICD-10-CM | POA: Diagnosis not present

## 2024-05-12 DIAGNOSIS — E038 Other specified hypothyroidism: Secondary | ICD-10-CM | POA: Diagnosis not present

## 2024-05-12 DIAGNOSIS — F419 Anxiety disorder, unspecified: Secondary | ICD-10-CM

## 2024-05-12 MED ORDER — TRIAMCINOLONE ACETONIDE 40 MG/ML IJ SUSP: 60.0000 mg | Freq: Once | INTRAMUSCULAR | Status: AC

## 2024-05-12 NOTE — Progress Notes (Signed)
 Subjective:  Patient ID: Lori Wise, female    DOB: Sep 26, 1961  Age: 63 y.o. MRN: [id number]  Chief Complaint  Patient presents with   Medical Management of Chronic Issues    .  Hyperlipidemia    Pt presents for follow up of hypertension. The patient is tolerating the medication well without side effects. Compliance with treatment has been good; including taking medication as directed , maintains a healthy diet and regular exercise regimen , and following up as directed. She is currently on tenormin  25mg  1 po bid  Pt with  asthma/COPD - is having no problems with cough/congestion or wheezing Currently uses trelegy and albuterol  - She is due to repeat chest CT but states her oncologist is ordering a chest CT at this time She quit smoking 05/28/23  Pt with GERD- sympotms stable on prilosec 20mg  qd -   Mixed hyperlipidemia  Pt presents with hyperlipidemia.  The patient is compliant with medications, maintains a low cholesterol diet , follows up as directed , and maintains an exercise regimen . The patient denies experiencing any hypercholesterolemia related symptoms. Pt currently on crestor  5mg  qd and zetia  10mg  qd - she also takes fish oil qd  Pt with hypothyroidism - due for labwork - taking synthroid 25mcg qd - voices no concerns  Pt with anxiety - symptoms stable on alprazolam  that she takes as needed  pt has been diagnosed with breast cancer of right breast - currently following with oncology and is on arimidex  therapy  Pt has rash on top of both feet after prolonged sun exposure working in yard - states is very itchy  Current Outpatient Medications on File Prior to Visit  Medication Sig Dispense Refill   albuterol  (VENTOLIN  HFA) 108 (90 Base) MCG/ACT inhaler Inhale 2 puffs into the lungs every 6 (six) hours as needed for wheezing or shortness of breath. 8 g 2   ALPRAZolam  (XANAX ) 0.5 MG tablet TAKE ONE TABLET BY MOUTH 3 TIMES DAILY 90 tablet 0   anastrozole  (ARIMIDEX ) 1 MG  tablet Take 1 tablet (1 mg total) by mouth daily. (Patient taking differently: Take 1 mg by mouth daily. Take 1/2 pill daily) 90 tablet 3   Ascorbic Acid (VITAMIN C PO) Take 1 tablet by mouth daily. Unknown strength     aspirin EC 81 MG tablet Take 81 mg by mouth daily. Swallow whole.     atenolol  (TENORMIN ) 25 MG tablet TAKE ONE TABLET BY MOUTH TWICE DAILY 60 tablet 5   cetirizine  (ZYRTEC ) 10 MG tablet TAKE ONE TABLET BY MOUTH DAILY 30 tablet 11   Cholecalciferol (VITAMIN D3) 25 MCG (1000 UT) CAPS Take 1 Units by mouth daily.     Cyanocobalamin (VITAMIN B-12 PO) Take 1 tablet by mouth daily. Unknown strenght     Dermatological Products, Misc. (STRATA XRT) GEL Apply topically daily.     Emollient (ROC RETINOL CORREXION EX) Apply topically.     ezetimibe  (ZETIA ) 10 MG tablet TAKE ONE TABLET BY MOUTH EVERY DAY 30 tablet 5   fluticasone  (FLONASE ) 50 MCG/ACT nasal spray Place 2 sprays into both nostrils daily. 16 g 6   Fluticasone -Umeclidin-Vilant (TRELEGY ELLIPTA ) 200-62.5-25 MCG/ACT AEPB Inhale 1 Inhalation into the lungs daily. 1 each 5   Folic Acid-Vit B6-Vit B12 (FOLBEE) 2.5-25-1 MG TABS tablet Take 1 tablet by mouth daily.     ibuprofen  (ADVIL ) 800 MG tablet Take 1 tablet (800 mg total) by mouth every 8 (eight) hours as needed for mild pain (pain score 1-3)  or moderate pain (pain score 4-6). 90 tablet 1   ipratropium-albuterol  (DUONEB) 0.5-2.5 (3) MG/3ML SOLN Take 3 mLs by nebulization every 4 (four) hours as needed. 360 mL 3   levothyroxine (SYNTHROID) 25 MCG tablet TAKE ONE TABLET BY MOUTH EVERY DAY 90 tablet 1   Omega-3 1000 MG CAPS Take 1 capsule by mouth daily.     omeprazole  (PRILOSEC) 20 MG capsule Take 1 capsule (20 mg total) by mouth 2 (two) times daily before a meal. 180 capsule 1   rosuvastatin  (CRESTOR ) 5 MG tablet Take 1 tablet (5 mg total) by mouth daily. 90 tablet 0   tretinoin (RETIN-A) 0.05 % cream Apply topically at bedtime.     VITAMIN E PO Take 1 tablet by mouth daily at 6  (six) AM. Unknown strength     No current facility-administered medications on file prior to visit.   Past Medical History:  Diagnosis Date   Acute laryngopharyngitis 04/19/2020   Anxiety 04/19/2020   Atypical nevus 04/19/2020   Bronchitis 04/30/2020   Chronic obstructive pulmonary disease (HCC) 10/22/2020   Chronic obstructive pulmonary disease with (acute) lower respiratory infection (HCC)    Encounter for immunization 04/19/2020   Essential (primary) hypertension    Gastroesophageal reflux disease 10/22/2020   Hormone replacement therapy (HRT) 10/22/2020   Malignant neoplasm of lower-inner quadrant of right female breast (HCC)    Migraine without aura, not intractable, without status migrainosus    Mixed hyperlipidemia    Other chest pain 03/21/2021   Other specified hypothyroidism 04/19/2020   Primary hypertension 03/21/2021   Right leg pain 07/23/2020   Past Surgical History:  Procedure Laterality Date   ABDOMINAL HYSTERECTOMY     BREAST BIOPSY Left 10/15/2023   MM LT BREAST BX W LOC DEV 1ST LESION IMAGE BX SPEC STEREO GUIDE 10/15/2023 [facility]   BREAST BIOPSY Right 10/15/2023   MM RT BREAST BX W LOC DEV 1ST LESION IMAGE BX SPEC STEREO GUIDE 10/15/2023 [facility]   BREAST BIOPSY  11/10/2023   MM RT RADIOACTIVE SEED LOC MAMMO GUIDE 11/10/2023 [facility]   BREAST LUMPECTOMY WITH RADIOACTIVE SEED AND SENTINEL LYMPH NODE BIOPSY Right 11/11/2023   Procedure: RIGHT BREAST SEED LOCALIZED  LUMPECTOMY AND SENTINEL NODE BIOPSY;  Surgeon: Oza Blumenthal, MD;  Location: [facility];  Service: General;  Laterality: Right;   CARDIAC CATHETERIZATION  2000   normal   CHOLECYSTECTOMY     NASAL HEMORRHAGE CONTROL     OTHER SURGICAL HISTORY     Ear surgeries x5    Family History  Problem Relation Age of Onset   Suicidality Mother    Lung cancer Father 53       smoked   Lung cancer Sister 43       smoked   Brain cancer Brother 34   Lung cancer  Brother 36       smoked   Breast cancer Paternal Aunt    Prostate cancer Paternal Uncle 73 - 99   Breast cancer Paternal Grandmother 69 - 26   Social History   Socioeconomic History   Marital status: Legally Separated    Spouse name: Not on file   Number of children: 2   Years of education: Not on file   Highest education level: 12th grade  Occupational History   Occupation: [employer] service  Tobacco Use   Smoking status: Former    Current packs/day: 0.00    Types: Cigarettes    Quit date: 05/28/2023  Years since quitting: 0.9   Smokeless tobacco: Never  Vaping Use   Vaping status: Never Used  Substance and Sexual Activity   Alcohol use: Not Currently    Comment: Drinks approximately two times per week.   Drug use: Never   Sexual activity: Not on file  Other Topics Concern   Not on file  Social History Narrative   Not on file   Social Drivers of Health   Financial Resource Strain: Low Risk  (08/04/2023)   Overall Financial Resource Strain (CARDIA)    Difficulty of Paying Living Expenses: Not hard at all  Food Insecurity: No Food Insecurity (10/21/2023)   Hunger Vital Sign    Worried About Running Out of Food in the Last Year: Never true    Ran Out of Food in the Last Year: Never true  Transportation Needs: No Transportation Needs (10/21/2023)   PRAPARE - Administrator, Civil Service (Medical): No    Lack of Transportation (Non-Medical): No  Physical Activity: Inactive (08/04/2023)   Exercise Vital Sign    Days of Exercise per Week: 0 days    Minutes of Exercise per Session: 0 min  Stress: No Stress Concern Present (08/04/2023)   [employer] of Occupational Health - Occupational Stress Questionnaire    Feeling of Stress : Not at all  Social Connections: Unknown (08/04/2023)   Social Connection and Isolation Panel [NHANES]    Frequency of Communication with Friends and Family: More than three times a week    Frequency of Social Gatherings with  Friends and Family: Three times a week    Attends Religious Services: Patient declined    Active Member of Clubs or Organizations: No    Attends Banker Meetings: Never    Marital Status: Patient declined   CONSTITUTIONAL: Negative for chills, fatigue, fever, unintentional weight gain and unintentional weight loss.  E/N/T: Negative for ear pain, nasal congestion and sore throat.  CARDIOVASCULAR: Negative for chest pain, dizziness, palpitations and pedal edema.  RESPIRATORY: Negative for recent cough and dyspnea.  GASTROINTESTINAL: Negative for abdominal pain, acid reflux symptoms, constipation, diarrhea, nausea and vomiting.  MSK: Negative for arthralgias and myalgias.  INTEGUMENTARY: see HPI NEUROLOGICAL: Negative for dizziness and headaches.  PSYCHIATRIC: Negative for sleep disturbance and to question depression screen.  Negative for depression, negative for anhedonia.        Objective:  PHYSICAL EXAM:     05/12/2024    9:03 AM 01/11/2024    8:19 AM 10/21/2023    4:10 PM 08/04/2023   10:39 AM 04/15/2023    9:48 AM  Depression screen PHQ 2/9  Decreased Interest 0 2 0 0 0  Down, Depressed, Hopeless 0 2 0 0 0  PHQ - 2 Score 0 4 0 0 0  Altered sleeping 0 2  1 0  Tired, decreased energy 0 2  1 1   Change in appetite 0 3  1 0  Feeling bad or failure about yourself  0 0  0 0  Trouble concentrating 0 0  0 0  Moving slowly or fidgety/restless 0 0  0 0  Suicidal thoughts 0 0  0 0  PHQ-9 Score 0 11  3 1   Difficult doing work/chores Not difficult at all Somewhat difficult  Not difficult at all Not difficult at all  PHYSICAL EXAM:   VS: BP 106/68   Pulse 82   Temp 97.8 F (36.6 C) (Temporal)   Resp 18   Ht 5\' 4"  (1.626  m)   Wt 150 lb 9.6 oz (68.3 kg)   SpO2 96%   BMI 25.85 kg/m   GEN: Well nourished, well developed, in no acute distress   Cardiac: RRR; no murmurs, rubs, or gallops,no edema -  Respiratory:  normal respiratory rate and pattern with no distress -  normal breath sounds with no rales, rhonchi, wheezes or rubs = MS: no deformity or atrophy  Skin: small red papules on tops of both feet Neuro:  Alert and Oriented x 3,  - CN II-Xii grossly intact Psych: euthymic mood, appropriate affect and demeanor   Lab Results  Component Value Date   WBC 10.9 (H) 05/05/2024   HGB 12.1 05/05/2024   HCT 35.5 (L) 05/05/2024   PLT 310 05/05/2024   GLUCOSE 91 05/05/2024   CHOL 130 01/11/2024   TRIG 105 01/11/2024   HDL 50 01/11/2024   LDLCALC 61 01/11/2024   ALT 25 05/05/2024   AST 27 05/05/2024   NA 142 05/05/2024   K 4.2 05/05/2024   CL 106 05/05/2024   CREATININE 0.77 05/05/2024   BUN 20 05/05/2024   CO2 30 05/05/2024   TSH 2.220 01/11/2024   HGBA1C 6.0 (H) 01/11/2024      Assessment & Plan:   Problem List Items Addressed This Visit       Cardiovascular and Mediastinum   Primary hypertension   Relevant Orders   CBC with Differential/Platelet   Comprehensive metabolic panel Continue meds Low salt diet     Respiratory   Chronic obstructive pulmonary disease with (acute) lower respiratory infection (HCC) Continue inhalers as directed      Digestive   GERD without esophagitis - Primary Continue prilosec 20mg  qd     Endocrine   Other specified hypothyroidism   Relevant Orders   TSH Continue synthroid 25mcg qd     Other   Mixed hyperlipidemia   Relevant Orders   Lipid panel Continue fish oil, zetia  and crestor  Low chol diet   Anxiety Continue  ativan prn   Vitamin D  deficiency   Relevant Orders   VITAMIN D  25 Hydroxy (Vit-D Deficiency, Fractures)  Malignant neoplasm of right breast  Follow up with oncology as directed  Dermatitis Kenalog  60mg  given IM  .  Meds ordered this encounter  Medications   triamcinolone  acetonide (KENALOG -40) injection 60 mg    Orders Placed This Encounter  Procedures   CBC with Differential/Platelet   Comprehensive metabolic panel with GFR   TSH   Lipid panel   Hemoglobin  A1c   VITAMIN D  25 Hydroxy (Vit-D Deficiency, Fractures)     Follow-up: Return in about 4 months (around 09/11/2024) for chronic fasting follow-up.  An After Visit Summary was printed and given to the patient.  Anthonette Bastos [facility] [phone]

## 2024-05-14 ENCOUNTER — Other Ambulatory Visit: Payer: Self-pay | Admitting: Physician Assistant

## 2024-05-14 DIAGNOSIS — J3089 Other allergic rhinitis: Secondary | ICD-10-CM

## 2024-05-14 DIAGNOSIS — E782 Mixed hyperlipidemia: Secondary | ICD-10-CM

## 2024-05-16 NOTE — Therapy (Signed)
 OUTPATIENT PHYSICAL THERAPY  UPPER EXTREMITY ONCOLOGY EVALUATION  Patient Name: Lori Wise MRN: [id number] DOB:Apr 29, 1961, 63 y.o., female Today's Date: 05/16/2024  END OF SESSION:   Past Medical History:  Diagnosis Date   Acute laryngopharyngitis 04/19/2020   Anxiety 04/19/2020   Atypical nevus 04/19/2020   Bronchitis 04/30/2020   Chronic obstructive pulmonary disease (HCC) 10/22/2020   Chronic obstructive pulmonary disease with (acute) lower respiratory infection (HCC)    Encounter for immunization 04/19/2020   Essential (primary) hypertension    Gastroesophageal reflux disease 10/22/2020   Hormone replacement therapy (HRT) 10/22/2020   Malignant neoplasm of lower-inner quadrant of right female breast (HCC)    Migraine without aura, not intractable, without status migrainosus    Mixed hyperlipidemia    Other chest pain 03/21/2021   Other specified hypothyroidism 04/19/2020   Primary hypertension 03/21/2021   Right leg pain 07/23/2020   Past Surgical History:  Procedure Laterality Date   ABDOMINAL HYSTERECTOMY     BREAST BIOPSY Left 10/15/2023   MM LT BREAST BX W LOC DEV 1ST LESION IMAGE BX SPEC STEREO GUIDE 10/15/2023 [facility]   BREAST BIOPSY Right 10/15/2023   MM RT BREAST BX W LOC DEV 1ST LESION IMAGE BX SPEC STEREO GUIDE 10/15/2023 [facility]   BREAST BIOPSY  11/10/2023   MM RT RADIOACTIVE SEED LOC MAMMO GUIDE 11/10/2023 [facility]   BREAST LUMPECTOMY WITH RADIOACTIVE SEED AND SENTINEL LYMPH NODE BIOPSY Right 11/11/2023   Procedure: RIGHT BREAST SEED LOCALIZED  LUMPECTOMY AND SENTINEL NODE BIOPSY;  Surgeon: Oza Blumenthal, MD;  [facility];  Service: General;  Laterality: Right;   CARDIAC CATHETERIZATION  2000   normal   CHOLECYSTECTOMY     NASAL HEMORRHAGE CONTROL     OTHER SURGICAL HISTORY     Ear surgeries x5   Patient Active Problem List   Diagnosis Date Noted   Hyperglycemia 01/11/2024   Genetic testing  11/04/2023   Malignant neoplasm of lower-inner quadrant of right breast of female, estrogen receptor positive (HCC) 10/19/2023   Epistaxis 05/06/2023   Acute cystitis with hematuria 04/16/2023   Neck pain 04/16/2023   Tobacco abuse 01/28/2023   Allergic rhinitis due to allergen 04/15/2022   Need for pneumococcal 20-valent conjugate vaccination 04/15/2022   Vitamin D  deficiency 04/15/2022   Smoking 08/30/2021   Chronic obstructive pulmonary disease with (acute) lower respiratory infection (HCC)    Essential (primary) hypertension    Migraine without aura, not intractable, without status migrainosus    Sunburn of second degree    Atypical chest pain 03/21/2021   Primary hypertension 03/21/2021   Chronic obstructive pulmonary disease (HCC) 10/22/2020   Hormone replacement therapy (HRT) 10/22/2020   GERD without esophagitis 10/22/2020   Right leg pain 07/23/2020   Bronchitis 04/30/2020   Mixed hyperlipidemia 04/19/2020   Other specified hypothyroidism 04/19/2020   Anxiety 04/19/2020   Acute laryngopharyngitis 04/19/2020   Encounter for immunization 04/19/2020   Atypical nevus 04/19/2020    PCP: ***  REFERRING PROVIDER: ***  REFERRING DIAG: ***  THERAPY DIAG:  No diagnosis found.  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 It measures 1.1 cm and is located in the lower inner quadrant. It is ER/PR positive and HER2 negative with a Ki67 of 3%. Rt lumpectomy and SLNB on 11/11/23 with 8 negative nodes removed.   PAIN:  Are you having pain? {yes/no:20286} NPRS scale: ***/10 Pain location: *** Pain orientation:  {Pain Orientation:25161}  PAIN TYPE: {type:313116} Pain description: {PAIN DESCRIPTION:21022940}  Aggravating factors: *** Relieving factors: ***  PRECAUTIONS: Right UE lymphedema risk  RED FLAGS: None   WEIGHT BEARING RESTRICTIONS: No  FALLS:  Has patient fallen in last 6 months? {fallsyesno:27318}  LIVING ENVIRONMENT: Lives with: her adult daughter and husband  Lives in: House/apartment   OCCUPATION: retired  LEISURE: gardening,  HAND DOMINANCE: right   PRIOR LEVEL OF FUNCTION: Independent  PATIENT GOALS: ***   OBJECTIVE: Note: Objective measures were completed at Evaluation unless otherwise noted.  COGNITION: Overall cognitive status: Within functional limits for tasks assessed   PALPATION: ***  OBSERVATIONS / OTHER ASSESSMENTS: ***  SENSATION: Light touch: {intact/deficits:24005}  POSTURE: Forward head and rounded shoulders posture   UPPER EXTREMITY AROM/PROM:  A/PROM RIGHT   eval   Shoulder extension   Shoulder flexion   Shoulder abduction   Shoulder internal rotation   Shoulder external rotation     (Blank rows = not tested)  A/PROM LEFT   eval  Shoulder extension   Shoulder flexion   Shoulder abduction   Shoulder internal rotation   Shoulder external rotation     (Blank rows = not tested)  CERVICAL AROM: All within normal limits:    Percent limited  Flexion   Extension   Right lateral flexion   Left lateral flexion   Right rotation   Left rotation     UPPER EXTREMITY STRENGTH:   LYMPHEDEMA ASSESSMENTS:   SURGERY TYPE/DATE: Rt lumpectomy and SLNB on 11/11/23  NUMBER OF LYMPH NODES REMOVED: 0+/8  CHEMOTHERAPY: no  RADIATION:Yes, ended 01/27/2024  HORMONE TREATMENT: Yes, Anastrozole   INFECTIONS: ***   LYMPHEDEMA ASSESSMENTS:   LANDMARK RIGHT  eval  At axilla    15 cm proximal to olecranon process   10 cm proximal to olecranon process   Olecranon process   15 cm proximal to ulnar styloid process   10 cm  proximal to ulnar styloid process   Just proximal to ulnar styloid process   Across hand at thumb web space   At base of 2nd digit   (Blank rows = not tested)  LANDMARK LEFT  eval  At axilla    15 cm proximal to olecranon process   10 cm proximal to olecranon process   Olecranon process   15 cm proximal to ulnar styloid process   10 cm proximal to ulnar styloid process   Just proximal to ulnar styloid process   Across hand at thumb web space   At base of 2nd digit   (Blank rows = not tested)   FUNCTIONAL TESTS:  {Functional tests:24029}  GAIT: Distance walked: *** Assistive device utilized: {Assistive devices:23999} Level of assistance: {Levels of assistance:24026} Comments: ***  L-DEX LYMPHEDEMA SCREENING: The patient was assessed using the L-Dex machine today to produce a lymphedema index baseline score. The patient will be reassessed on a regular basis (typically every 3 months) to obtain new L-Dex scores. If the score is > 6.5 points away from his/her baseline score indicating onset of subclinical lymphedema, it will be  recommended to wear a compression garment for 4 weeks, 12 hours per day and then be reassessed. If the score continues to be > 6.5 points from baseline at reassessment, we will initiate lymphedema treatment. Assessing in this manner has a 95% rate of preventing clinically significant lymphedema.  QUICK DASH SURVEY: ***                                                                                                                            TREATMENT DATE: ***    PATIENT EDUCATION:  Education details: *** Person educated: {Person educated:25204} Education method: {Education Method:25205} Education comprehension: {Education Comprehension:25206}  HOME EXERCISE PROGRAM: ***  ASSESSMENT:  CLINICAL IMPRESSION: Patient is a 63 y.o. female who was seen today for physical therapy evaluation and treatment for concerns of right shoulder pain and  lymphedema..    OBJECTIVE IMPAIRMENTS: {opptimpairments:25111}.   ACTIVITY LIMITATIONS: {activitylimitations:27494}  PARTICIPATION LIMITATIONS: {participationrestrictions:25113}  PERSONAL FACTORS: {Personal factors:25162} are also affecting patient's functional outcome.   REHAB POTENTIAL: {rehabpotential:25112}  CLINICAL DECISION MAKING: {clinical decision making:25114}  EVALUATION COMPLEXITY: {Evaluation complexity:25115}  GOALS: Goals reviewed with patient? {yes/no:20286}  SHORT TERM GOALS: Target date: ***  *** Baseline: Goal status: INITIAL  2.  *** Baseline:  Goal status: INITIAL  3.  *** Baseline:  Goal status: INITIAL  4.  *** Baseline:  Goal status: INITIAL  5.  *** Baseline:  Goal status: INITIAL  6.  *** Baseline:  Goal status: INITIAL  LONG TERM GOALS: Target date: ***  *** Baseline:  Goal status: INITIAL  2.  *** Baseline:  Goal status: INITIAL  3.  *** Baseline:  Goal status: INITIAL  4.  *** Baseline:  Goal status: INITIAL  5.  *** Baseline:  Goal status: INITIAL  6.  *** Baseline:  Goal status: INITIAL  PLAN:  PT FREQUENCY: {rehab frequency:25116}  PT DURATION: {rehab duration:25117}  PLANNED INTERVENTIONS: {rehab planned interventions:25118::"Patient/Family education","Balance training","Joint mobilization","Therapeutic exercises","Therapeutic activity","Neuromuscular re-education","Gait training","Self Care"}  PLAN FOR NEXT SESSION: ***  Latisha Poland, PT 05/16/2024, 7:50 AM

## 2024-05-17 ENCOUNTER — Ambulatory Visit: Attending: Adult Health

## 2024-05-17 ENCOUNTER — Other Ambulatory Visit: Payer: Self-pay

## 2024-05-17 DIAGNOSIS — L7682 Other postprocedural complications of skin and subcutaneous tissue: Secondary | ICD-10-CM | POA: Diagnosis not present

## 2024-05-17 DIAGNOSIS — M25611 Stiffness of right shoulder, not elsewhere classified: Secondary | ICD-10-CM | POA: Diagnosis not present

## 2024-05-17 DIAGNOSIS — M25511 Pain in right shoulder: Secondary | ICD-10-CM | POA: Diagnosis not present

## 2024-05-17 DIAGNOSIS — C50311 Malignant neoplasm of lower-inner quadrant of right female breast: Secondary | ICD-10-CM | POA: Diagnosis not present

## 2024-05-17 DIAGNOSIS — Z170 Estrogen receptor positive status [ER+]: Secondary | ICD-10-CM | POA: Insufficient documentation

## 2024-05-17 DIAGNOSIS — L905 Scar conditions and fibrosis of skin: Secondary | ICD-10-CM | POA: Diagnosis not present

## 2024-05-17 NOTE — Patient Instructions (Signed)
 Axillary web syndrome (also called cording) can happen after having breast cancer surgery when lymph nodes in the armpit are removed. It presents as if you have a thin cord in your arm and can run from the armpit all the way down into the forearm. If you've had a sentinel node biopsy, the risk is 1-20% and if you've had an axillary lymph node dissection (more than 7 nodes removed), the risk is 36-72%. The ranges vary depending on the research study.  It most often happens 3-4 weeks post-op but can happen sooner or later. There are several possibilities for what cording actually is. Although no one knows for sure as of yet, it may be related to lymphatics, veins, or other tissue. Sometimes cording resolves on its own but other times it requires physical therapy with a therapist who specializes in lymphedema and/or cancer rehab. Treatment typically involves stretching, manual techniques, and exercise. Sometimes cords get "released" while stretching or during manual treatment and the patient may experience the sensation of a "pop." This may feel strange but it is not dangerous and is a sign that the cord has released; range of motion may be improved in the process.

## 2024-05-19 DIAGNOSIS — Z419 Encounter for procedure for purposes other than remedying health state, unspecified: Secondary | ICD-10-CM | POA: Diagnosis not present

## 2024-05-23 ENCOUNTER — Ambulatory Visit: Payer: Self-pay | Admitting: Physician Assistant

## 2024-05-24 ENCOUNTER — Ambulatory Visit (HOSPITAL_BASED_OUTPATIENT_CLINIC_OR_DEPARTMENT_OTHER)
Admission: RE | Admit: 2024-05-24 | Discharge: 2024-05-24 | Disposition: A | Discharge status: Home / Self Care | Source: Ambulatory Visit | Attending: Adult Health | Admitting: Adult Health

## 2024-05-24 DIAGNOSIS — Z79811 Long term (current) use of aromatase inhibitors: Secondary | ICD-10-CM | POA: Diagnosis not present

## 2024-05-24 DIAGNOSIS — F17211 Nicotine dependence, cigarettes, in remission: Secondary | ICD-10-CM | POA: Diagnosis not present

## 2024-05-24 DIAGNOSIS — Z780 Asymptomatic menopausal state: Secondary | ICD-10-CM | POA: Diagnosis not present

## 2024-05-24 DIAGNOSIS — Z13820 Encounter for screening for osteoporosis: Secondary | ICD-10-CM

## 2024-05-24 DIAGNOSIS — M810 Age-related osteoporosis without current pathological fracture: Secondary | ICD-10-CM | POA: Diagnosis not present

## 2024-05-24 DIAGNOSIS — Z87891 Personal history of nicotine dependence: Secondary | ICD-10-CM

## 2024-05-25 ENCOUNTER — Ambulatory Visit

## 2024-05-30 ENCOUNTER — Ambulatory Visit

## 2024-05-30 ENCOUNTER — Other Ambulatory Visit: Payer: Self-pay | Admitting: Physician Assistant

## 2024-05-30 DIAGNOSIS — M25511 Pain in right shoulder: Secondary | ICD-10-CM | POA: Diagnosis not present

## 2024-05-30 DIAGNOSIS — L7682 Other postprocedural complications of skin and subcutaneous tissue: Secondary | ICD-10-CM | POA: Diagnosis not present

## 2024-05-30 DIAGNOSIS — C50311 Malignant neoplasm of lower-inner quadrant of right female breast: Secondary | ICD-10-CM

## 2024-05-30 DIAGNOSIS — M25611 Stiffness of right shoulder, not elsewhere classified: Secondary | ICD-10-CM

## 2024-05-30 DIAGNOSIS — L905 Scar conditions and fibrosis of skin: Secondary | ICD-10-CM | POA: Diagnosis not present

## 2024-05-30 DIAGNOSIS — E782 Mixed hyperlipidemia: Secondary | ICD-10-CM

## 2024-05-30 DIAGNOSIS — Z170 Estrogen receptor positive status [ER+]: Secondary | ICD-10-CM | POA: Diagnosis not present

## 2024-05-30 DIAGNOSIS — F419 Anxiety disorder, unspecified: Secondary | ICD-10-CM

## 2024-05-30 NOTE — Therapy (Signed)
 OUTPATIENT PHYSICAL THERAPY  UPPER EXTREMITY ONCOLOGY TREATMENT  Patient Name: Lori Wise MRN: [id number] DOB:Oct 23, 1961, 63 y.o., female Today's Date: 05/30/2024  END OF SESSION:  PT End of Session - 05/30/24 1358     Visit Number 2    Number of Visits 12    Date for PT Re-Evaluation 06/28/24    Authorization Type [facility] Medicaid    Authorization Time Period 05/17/2024-07/16/2024    Authorization - Visit Number 2    Authorization - Number of Visits 12    PT Start Time 1400    PT Stop Time 1458    PT Time Calculation (min) 58 min    Activity Tolerance Patient tolerated treatment well    Behavior During Therapy [facility] for tasks assessed/performed          Past Medical History:  Diagnosis Date   Acute laryngopharyngitis 04/19/2020   Anxiety 04/19/2020   Atypical nevus 04/19/2020   Bronchitis 04/30/2020   Chronic obstructive pulmonary disease (HCC) 10/22/2020   Chronic obstructive pulmonary disease with (acute) lower respiratory infection (HCC)    Encounter for immunization 04/19/2020   Essential (primary) hypertension    Gastroesophageal reflux disease 10/22/2020   Hormone replacement therapy (HRT) 10/22/2020   Malignant neoplasm of lower-inner quadrant of right female breast (HCC)    Migraine without aura, not intractable, without status migrainosus    Mixed hyperlipidemia    Other chest pain 03/21/2021   Other specified hypothyroidism 04/19/2020   Primary hypertension 03/21/2021   Right leg pain 07/23/2020   Past Surgical History:  Procedure Laterality Date   ABDOMINAL HYSTERECTOMY     BREAST BIOPSY Left 10/15/2023   MM LT BREAST BX W LOC DEV 1ST LESION IMAGE BX SPEC STEREO GUIDE 10/15/2023 [facility]   BREAST BIOPSY Right 10/15/2023   MM RT BREAST BX W LOC DEV 1ST LESION IMAGE BX SPEC STEREO GUIDE 10/15/2023 [facility]   BREAST BIOPSY  11/10/2023   MM RT RADIOACTIVE SEED LOC MAMMO GUIDE 11/10/2023 [facility]   BREAST LUMPECTOMY WITH RADIOACTIVE  SEED AND SENTINEL LYMPH NODE BIOPSY Right 11/11/2023   Procedure: RIGHT BREAST SEED LOCALIZED  LUMPECTOMY AND SENTINEL NODE BIOPSY;  Surgeon: Vernetta Berg, MD;  Location: [facility];  Service: General;  Laterality: Right;   CARDIAC CATHETERIZATION  2000   normal   CHOLECYSTECTOMY     NASAL HEMORRHAGE CONTROL     OTHER SURGICAL HISTORY     Ear surgeries x5   Patient Active Problem List   Diagnosis Date Noted   Hyperglycemia 01/11/2024   Genetic testing 11/04/2023   Malignant neoplasm of lower-inner quadrant of right breast of female, estrogen receptor positive (HCC) 10/19/2023   Epistaxis 05/06/2023   Acute cystitis with hematuria 04/16/2023   Neck pain 04/16/2023   Tobacco abuse 01/28/2023   Allergic rhinitis due to allergen 04/15/2022   Need for pneumococcal 20-valent conjugate vaccination 04/15/2022   Vitamin D  deficiency 04/15/2022   Smoking 08/30/2021   Chronic obstructive pulmonary disease with (acute) lower respiratory infection (HCC)    Essential (primary) hypertension    Migraine without aura, not intractable, without status migrainosus    Sunburn of second degree    Atypical chest pain 03/21/2021   Primary hypertension 03/21/2021   Chronic obstructive pulmonary disease (HCC) 10/22/2020   Hormone replacement therapy (HRT) 10/22/2020   GERD without esophagitis 10/22/2020   Right leg pain 07/23/2020   Bronchitis 04/30/2020   Mixed hyperlipidemia 04/19/2020   Other specified hypothyroidism 04/19/2020   Anxiety  04/19/2020   Acute laryngopharyngitis 04/19/2020   Encounter for immunization 04/19/2020   Atypical nevus 04/19/2020     REFERRING PROVIDER: Crawford Jacobsen, NP  REFERRING DIAG: s/p Right Breast Cancer  THERAPY DIAG:  Malignant neoplasm of lower-inner quadrant of right breast of female, estrogen receptor positive (HCC)  Stiffness of right shoulder, not elsewhere classified  Right shoulder pain, unspecified chronicity  Axillary web  syndrome  ONSET DATE: 02/2024  Rationale for Evaluation and Treatment: Rehabilitation  SUBJECTIVE:                                                                                                                                                                                           SUBJECTIVE STATEMENT:  I have been doing the exercises and I got 2 sleeves in [place]. The sleeve feels good and I am feeling much better overall. I can reach overhead without pain. Pain is 100% better.  EVAL When I saw Jacobsen she noticed there was some swelling in my right UE, and my hand swells some in the evening especially. I am having pain in her right shoulder. I get occasional sharp pains in my breast. I get some pain in the right axillary region too. 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 It measures 1.1 cm and is located in the lower inner quadrant. It is ER/PR positive and HER2 negative with a Ki67 of 3%. Rt lumpectomy and SLNB on 11/11/23 with 8 negative nodes removed.   PAIN:  Are you having pain? Yes NPRS scale: 1/10 Pain location: around right shoulder Pain orientation: Right  PAIN TYPE: sharp Pain description: intermittent  Aggravating factors: reaching overhead, housework like dusting,laying on right side, reaching behind back, year dwork Relieving factors: pregnancy pillow,800 ibuprofen , propping arm  PRECAUTIONS: Right UE lymphedema risk  RED FLAGS: None   WEIGHT BEARING RESTRICTIONS: No  FALLS:  Has patient fallen in last 6 months? No  LIVING ENVIRONMENT: Lives with: her adult daughter  Lives in: House/apartment   OCCUPATION: retired  LEISURE: gardening,  HAND DOMINANCE: right   PRIOR LEVEL OF FUNCTION: Independent  PATIENT GOALS:  To get back to where I was, work in yard, do housework   OBJECTIVE: Note: Objective measures were  completed at Evaluation unless otherwise noted.  COGNITION: Overall cognitive status: Within functional limits for tasks assessed   PALPATION: Tender right axillary region , chest and medial upper arm, several thin cords noted  OBSERVATIONS / OTHER ASSESSMENTS:   SENSATION: Light touch: Deficits  POSTURE: Forward head and rounded shoulders posture   UPPER EXTREMITY AROM/PROM:  A/PROM RIGHT   eval  RIGHT 05/30/2024  Shoulder extension 32,+chest 44  Shoulder flexion 112,+chest 148  Shoulder abduction 130, chest, shoulder 157  Shoulder internal rotation 55, shoulder   Shoulder external rotation 85     (Blank rows = not tested)  A/PROM LEFT   eval  Shoulder extension 46  Shoulder flexion 160  Shoulder abduction 175  Shoulder internal rotation 58  Shoulder external rotation 98    (Blank rows = not tested)  CERVICAL AROM: All within functional limits:  UPPER EXTREMITY STRENGTH:   LYMPHEDEMA ASSESSMENTS:   SURGERY TYPE/DATE: Rt lumpectomy and SLNB on 11/11/23  NUMBER OF LYMPH NODES REMOVED: 0+/8  CHEMOTHERAPY: no  RADIATION:Yes, ended 01/27/2024  HORMONE TREATMENT: Yes, Anastrozole   :    LYMPHEDEMA ASSESSMENTS:   LANDMARK RIGHT  eval  At axilla  30.4  15 cm proximal to olecranon process 30.6  10 cm proximal to olecranon process 29.1  Olecranon process 23.7  15 cm proximal to ulnar styloid process 22.6  10 cm proximal to ulnar styloid process 20.7  Just proximal to ulnar styloid process 14.7  Across hand at thumb web space 18.3  At base of 2nd digit 5.5  (Blank rows = not tested)  LANDMARK LEFT  eval  At axilla  29.5  15 cm proximal to olecranon process 29.5  10 cm proximal to olecranon process 27.6  Olecranon process 23.4  15 cm proximal to ulnar styloid process 22.3  10 cm proximal to ulnar styloid process 19.3  Just proximal to ulnar styloid process 14.7  Across hand at thumb web space 18.35  At base of 2nd digit 5.5  (Blank rows = not  tested)   FUNCTIONAL TESTS:    GAIT:WNL  L-DEX LYMPHEDEMA SCREENING: The patient was assessed using the L-Dex machine today to produce a lymphedema index baseline score. The patient will be reassessed on a regular basis (typically every 3 months) to obtain new L-Dex scores. If the score is > 6.5 points away from his/her baseline score indicating onset of subclinical lymphedema, it will be recommended to wear a compression garment for 4 weeks, 12 hours per day and then be reassessed. If the score continues to be > 6.5 points from baseline at reassessment, we will initiate lymphedema treatment. Assessing in this manner has a 95% rate of preventing clinically significant lymphedema.  QUICK DASH SURVEY: 50%                                                                                                                            TREATMENT DATE:   05/30/2024 Pt showed me her 2 sleeves one with gauntlet attached; both different sizes with no tag inside but low compression Supine wand flexion and scaption x 5 LTR x 4 B 3 D AROM;B. Flexion, scaption, horizontal abd, snow angels  x4 MFR right upper arm, forearm, axilla PROM right shoulder  flex, scaption, abd, IR and ER Ball rolls on wall x 10 forward, 5 abd Wall arc to the left x 5 Pec wall stretch x3 right Updated HEP EVAL Educated pt about axillary Web syndrome and gave handout. Performed SOZO to r/o lymphedema and pt is in the green. Educated to perform supine AAflexion and stargazer in supine and gentle cord stretching with wrist extension, and reeducated in wall slide. Discussed importance of preventing UT compensation for shoulder pain and showed how to depress scapula. Discussed benefits of compression sleeve for cording and lymphedema prevention. Will measure next visit.    PATIENT EDUCATION:  Access Code: [email or web address] URL: [email or web address] Date: 05/30/2024 Prepared by: Grayce Sheldon  Exercises - Supine Lower Trunk  Rotation  - 2 x daily - 7 x weekly - 1 sets - 4 reps - Single Arm Doorway Pec Stretch at 90 Degrees Abduction  - 2 x daily - 7 x weekly - 1 sets - 3 reps - 20 hold - Standing Shoulder and Trunk Flexion at Table  - 2 x daily - 7 x weekly - 1 sets - 3 reps - 20 hold - Supine Shoulder Flexion Extension AAROM with Dowel  - 2 x daily - 7 x weekly - 1 sets - 15 reps Education details: see treatment today Person educated: Patient Education method: Explanation, Demonstration, and Handouts Education comprehension: verbalized understanding and returned demonstration  HOME EXERCISE PROGRAM: supine AAflexion and stargazer in supine and gentle cord stretching with wrist extension, and reeducated in wall slide, scapular retraction. Advised to avoid UT compensation and shoulder pain and to perform 5 reps only 2-3 times/day to the point of tightness, not pain Supine Lower Trunk Rotation  - 2 x daily - 7 x weekly - 1 sets - 4 reps - Single Arm Doorway Pec Stretch at 90 Degrees Abduction  - 2 x daily - 7 x weekly - 1 sets - 3 reps - 20 hold - Standing Shoulder and Trunk Flexion at Table  - 2 x daily - 7 x weekly - 1 sets - 3 reps - 20 hold - Supine Shoulder Flexion Extension AAROM with Dowel  - 2 x daily - 7 x weekly - 1 sets - 15 reps ASSESSMENT:  CLINICAL IMPRESSION: 05/30/2024 Excellent improvement in Right shoulder ROM and decreased pain by 100% per pt. Pt compliant with HEP and reminded again not to overdo it too soon or it may flare up. Advised to do exs 2-3 x's per day.  EVAL Patient is a 63 y.o. female who was seen today for physical therapy evaluation and treatment for concerns of right shoulder pain and lymphedema... She presents with significant limitations in right shoulder ROM with complaints of chest tightness and medial arm/axillary tightness. Pt noted to have some cording extending down the arm to the wrist. There is no significant difference in circumference measurements today and SOZO screen is  well in the green, but cording may explain some of the swelling she has had. She has been over exercising  to the point of creating right shoulder pain with compensation. She will benefit from skilled PT to address deficits and return to PLOF    OBJECTIVE IMPAIRMENTS: decreased activity tolerance, decreased knowledge of condition, decreased ROM, decreased strength, increased edema, impaired UE functional use, postural dysfunction, and pain.   ACTIVITY LIMITATIONS: carrying, sleeping, dressing, reach over head, and hygiene/grooming  PARTICIPATION LIMITATIONS: cleaning, laundry, and yard work  PERSONAL FACTORS: 1-2 comorbidities: Right breast cancer s/p radiation are also affecting patient's  functional outcome.   REHAB POTENTIAL: Good  CLINICAL DECISION MAKING: Stable/uncomplicated  EVALUATION COMPLEXITY: Moderate  GOALS: Goals reviewed with patient? Yes  SHORT TERM GOALS=LONG TERM GOALS: Target date: 06/28/2024  Pt will be independent in HEP for right shoulder ROM and strength Baseline: Goal status: MET 05/30/2024 2.  Pt will have decreased right shoulder/UE pain by atleast 50% for ability to perform household chores Baseline:  Goal status: MET 05/30/2024 3.  Right shoulder ROM will be within 5-10 degrees of left without increased pain for improved ability to reach into cabinets Baseline:  Goal status: INITIAL  4.  Pts quick dash will improve to no greater than 20 % to demonstrate improvement in functional activities Baseline:  Goal status: INITIAL  PLAN:  PT FREQUENCY: 2x/week  PT DURATION: 6 weeks  PLANNED INTERVENTIONS: 97164- PT Re-evaluation, 97110-Therapeutic exercises, 97530- Therapeutic activity, 97112- Neuromuscular re-education, 97535- Self Care, 02859- Manual therapy, 97760- Orthotic Initial, and Patient/Family education  PLAN FOR NEXT SESSION: STM prn ,MFR for cording, Cupping prn, PROM, review HEP and update as ready. Pt coming from [place].Set up SOZO screen  next visit, check ROM/goals. Pt doing well. Cancel appts not needed.  Grayce JINNY Sheldon, PT 05/30/2024, 3:14 PM

## 2024-06-01 ENCOUNTER — Encounter

## 2024-06-06 ENCOUNTER — Ambulatory Visit

## 2024-06-06 DIAGNOSIS — M25511 Pain in right shoulder: Secondary | ICD-10-CM

## 2024-06-06 DIAGNOSIS — L905 Scar conditions and fibrosis of skin: Secondary | ICD-10-CM | POA: Diagnosis not present

## 2024-06-06 DIAGNOSIS — M25611 Stiffness of right shoulder, not elsewhere classified: Secondary | ICD-10-CM

## 2024-06-06 DIAGNOSIS — C50311 Malignant neoplasm of lower-inner quadrant of right female breast: Secondary | ICD-10-CM | POA: Diagnosis not present

## 2024-06-06 DIAGNOSIS — Z170 Estrogen receptor positive status [ER+]: Secondary | ICD-10-CM

## 2024-06-06 DIAGNOSIS — L7682 Other postprocedural complications of skin and subcutaneous tissue: Secondary | ICD-10-CM

## 2024-06-06 NOTE — Patient Instructions (Addendum)
 Axillary web syndrome (also called cording) can happen after having breast cancer surgery when lymph nodes in the armpit are removed. It presents as if you have a thin cord in your arm and can run from the armpit all the way down into the forearm. If you've had a sentinel node biopsy, the risk is 1-20% and if you've had an axillary lymph node dissection (more than 7 nodes removed), the risk is 36-72%. The ranges vary depending on the research study.  It most often happens 3-4 weeks post-op but can happen sooner or later. There are several possibilities for what cording actually is. Although no one knows for sure as of yet, it may be related to lymphatics, veins, or other tissue. Sometimes cording resolves on its own but other times it requires physical therapy with a therapist who specializes in lymphedema and/or cancer rehab. Treatment typically involves stretching, manual techniques, and exercise. Sometimes cords get "released" while stretching or during manual treatment and the patient may experience the sensation of a "pop." This may feel strange but it is not dangerous and is a sign that the cord has released; range of motion may be improved in the process.   Over Head Pull: Narrow and Wide Grip   Cancer Rehab [phone]   On back, knees bent, feet flat, band across thighs, elbows straight but relaxed. Pull hands apart (start). Keeping elbows straight, bring arms up and over head, hands toward floor. Keep pull steady on band. Hold momentarily. Return slowly, keeping pull steady, back to start. Then do same with a wider grip on the band (past shoulder width) Repeat _5-10__ times. Band color __yellow____   Side Pull: Double Arm   On back, knees bent, feet flat. Arms perpendicular to body, shoulder level, elbows straight but relaxed. Pull arms out to sides, elbows straight. Resistance band comes across collarbones, hands toward floor. Hold momentarily. Slowly return to starting position. Repeat _5-10__  times. Band color _yellow____    Shoulder Rotation: Double Arm   On back, knees bent, feet flat, elbows tucked at sides, bent 90, hands palms up. Pull hands apart and down toward floor, keeping elbows near sides. Hold momentarily. Slowly return to starting position. Repeat _5-10__ times. Band color __yellow____

## 2024-06-06 NOTE — Therapy (Signed)
 OUTPATIENT PHYSICAL THERAPY  UPPER EXTREMITY ONCOLOGY TREATMENT  Patient Name: Lori Wise MRN: [id number] DOB:May 17, 1961, 63 y.o., female Today's Date: 06/06/2024  END OF SESSION:  PT End of Session - 06/06/24 1355     Visit Number 3    Number of Visits 12    Date for PT Re-Evaluation 06/28/24    Authorization Type [facility] Medicaid    Authorization Time Period 05/17/2024-07/16/2024    Authorization - Visit Number 3    Authorization - Number of Visits 12    PT Start Time 1358    PT Stop Time 1457    PT Time Calculation (min) 59 min    Activity Tolerance Patient tolerated treatment well    Behavior During Therapy [facility] for tasks assessed/performed          Past Medical History:  Diagnosis Date   Acute laryngopharyngitis 04/19/2020   Anxiety 04/19/2020   Atypical nevus 04/19/2020   Bronchitis 04/30/2020   Chronic obstructive pulmonary disease (HCC) 10/22/2020   Chronic obstructive pulmonary disease with (acute) lower respiratory infection (HCC)    Encounter for immunization 04/19/2020   Essential (primary) hypertension    Gastroesophageal reflux disease 10/22/2020   Hormone replacement therapy (HRT) 10/22/2020   Malignant neoplasm of lower-inner quadrant of right female breast (HCC)    Migraine without aura, not intractable, without status migrainosus    Mixed hyperlipidemia    Other chest pain 03/21/2021   Other specified hypothyroidism 04/19/2020   Primary hypertension 03/21/2021   Right leg pain 07/23/2020   Past Surgical History:  Procedure Laterality Date   ABDOMINAL HYSTERECTOMY     BREAST BIOPSY Left 10/15/2023   MM LT BREAST BX W LOC DEV 1ST LESION IMAGE BX SPEC STEREO GUIDE 10/15/2023 [facility]   BREAST BIOPSY Right 10/15/2023   MM RT BREAST BX W LOC DEV 1ST LESION IMAGE BX SPEC STEREO GUIDE 10/15/2023 [facility]   BREAST BIOPSY  11/10/2023   MM RT RADIOACTIVE SEED LOC MAMMO GUIDE 11/10/2023 [facility]   BREAST LUMPECTOMY WITH RADIOACTIVE  SEED AND SENTINEL LYMPH NODE BIOPSY Right 11/11/2023   Procedure: RIGHT BREAST SEED LOCALIZED  LUMPECTOMY AND SENTINEL NODE BIOPSY;  Surgeon: Vernetta Berg, MD;  Location: [facility];  Service: General;  Laterality: Right;   CARDIAC CATHETERIZATION  2000   normal   CHOLECYSTECTOMY     NASAL HEMORRHAGE CONTROL     OTHER SURGICAL HISTORY     Ear surgeries x5   Patient Active Problem List   Diagnosis Date Noted   Hyperglycemia 01/11/2024   Genetic testing 11/04/2023   Malignant neoplasm of lower-inner quadrant of right breast of female, estrogen receptor positive (HCC) 10/19/2023   Epistaxis 05/06/2023   Acute cystitis with hematuria 04/16/2023   Neck pain 04/16/2023   Tobacco abuse 01/28/2023   Allergic rhinitis due to allergen 04/15/2022   Need for pneumococcal 20-valent conjugate vaccination 04/15/2022   Vitamin D  deficiency 04/15/2022   Smoking 08/30/2021   Chronic obstructive pulmonary disease with (acute) lower respiratory infection (HCC)    Essential (primary) hypertension    Migraine without aura, not intractable, without status migrainosus    Sunburn of second degree    Atypical chest pain 03/21/2021   Primary hypertension 03/21/2021   Chronic obstructive pulmonary disease (HCC) 10/22/2020   Hormone replacement therapy (HRT) 10/22/2020   GERD without esophagitis 10/22/2020   Right leg pain 07/23/2020   Bronchitis 04/30/2020   Mixed hyperlipidemia 04/19/2020   Other specified hypothyroidism 04/19/2020   Anxiety  04/19/2020   Acute laryngopharyngitis 04/19/2020   Encounter for immunization 04/19/2020   Atypical nevus 04/19/2020     REFERRING PROVIDER: Crawford Jacobsen, NP  REFERRING DIAG: s/p Right Breast Cancer  THERAPY DIAG:  Malignant neoplasm of lower-inner quadrant of right breast of female, estrogen receptor positive (HCC)  Stiffness of right shoulder, not elsewhere classified  Right shoulder pain, unspecified chronicity  Axillary web  syndrome  ONSET DATE: 02/2024  Rationale for Evaluation and Treatment: Rehabilitation  SUBJECTIVE:                                                                                                                                                                                           SUBJECTIVE STATEMENT:  Arm is doing great. I can lay on my right side now. I can reach higher behind my back now. I don't feel any cording in my arm. I haven't used my chain saw. EVAL When I saw Jacobsen she noticed there was some swelling in my right UE, and my hand swells some in the evening especially. I am having pain in her right shoulder. I get occasional sharp pains in my breast. I get some pain in the right axillary region too. 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 It measures 1.1 cm and is located in the lower inner quadrant. It is ER/PR positive and HER2 negative with a Ki67 of 3%. Rt lumpectomy and SLNB on 11/11/23 with 8 negative nodes removed.   PAIN:  Are you having pain? No NPRS scale: 0/10 Pain location: around right shoulder Pain orientation: Right  PAIN TYPE: sharp Pain description: intermittent  Aggravating factors: reaching overhead, housework like dusting,laying on right side, reaching behind back, year dwork Relieving factors: pregnancy pillow,800 ibuprofen , propping arm  PRECAUTIONS: Right UE lymphedema risk  RED FLAGS: None   WEIGHT BEARING RESTRICTIONS: No  FALLS:  Has patient fallen in last 6 months? No  LIVING ENVIRONMENT: Lives with: her adult daughter  Lives in: House/apartment   OCCUPATION: retired  LEISURE: gardening,  HAND DOMINANCE: right   PRIOR LEVEL OF FUNCTION: Independent  PATIENT GOALS:  To get back to where I was, work in yard, do housework   OBJECTIVE: Note: Objective measures were completed at Evaluation  unless otherwise noted.  COGNITION: Overall cognitive status: Within functional limits for tasks assessed   PALPATION: Tender right axillary region , chest and medial upper arm, several thin cords noted  OBSERVATIONS / OTHER ASSESSMENTS:   SENSATION: Light touch: Deficits  POSTURE: Forward head and rounded shoulders posture   UPPER EXTREMITY AROM/PROM:  A/PROM RIGHT   eval  RIGHT 05/30/2024  Shoulder extension 32,+chest 44  Shoulder flexion 112,+chest 148  Shoulder abduction 130, chest, shoulder 157  Shoulder internal rotation 55, shoulder   Shoulder external rotation 85     (Blank rows = not tested)  A/PROM LEFT   eval  Shoulder extension 46  Shoulder flexion 160  Shoulder abduction 175  Shoulder internal rotation 58  Shoulder external rotation 98    (Blank rows = not tested)  CERVICAL AROM: All within functional limits:  UPPER EXTREMITY STRENGTH:   LYMPHEDEMA ASSESSMENTS:   SURGERY TYPE/DATE: Rt lumpectomy and SLNB on 11/11/23  NUMBER OF LYMPH NODES REMOVED: 0+/8  CHEMOTHERAPY: no  RADIATION:Yes, ended 01/27/2024  HORMONE TREATMENT: Yes, Anastrozole   :    LYMPHEDEMA ASSESSMENTS:   LANDMARK RIGHT  eval  At axilla  30.4  15 cm proximal to olecranon process 30.6  10 cm proximal to olecranon process 29.1  Olecranon process 23.7  15 cm proximal to ulnar styloid process 22.6  10 cm proximal to ulnar styloid process 20.7  Just proximal to ulnar styloid process 14.7  Across hand at thumb web space 18.3  At base of 2nd digit 5.5  (Blank rows = not tested)  LANDMARK LEFT  eval  At axilla  29.5  15 cm proximal to olecranon process 29.5  10 cm proximal to olecranon process 27.6  Olecranon process 23.4  15 cm proximal to ulnar styloid process 22.3  10 cm proximal to ulnar styloid process 19.3  Just proximal to ulnar styloid process 14.7  Across hand at thumb web space 18.35  At base of 2nd digit 5.5  (Blank rows = not tested)   FUNCTIONAL TESTS:     GAIT:WNL  L-DEX LYMPHEDEMA SCREENING: The patient was assessed using the L-Dex machine today to produce a lymphedema index baseline score. The patient will be reassessed on a regular basis (typically every 3 months) to obtain new L-Dex scores. If the score is > 6.5 points away from his/her baseline score indicating onset of subclinical lymphedema, it will be recommended to wear a compression garment for 4 weeks, 12 hours per day and then be reassessed. If the score continues to be > 6.5 points from baseline at reassessment, we will initiate lymphedema treatment. Assessing in this manner has a 95% rate of preventing clinically significant lymphedema.  QUICK DASH SURVEY: 50%                                                                                                                            TREATMENT DATE:   06/06/2024 Ball rolls on wall x 10 forward, 5 abd Wall arc to the left x 5 IR behind back with strap x 5 Snow angels x 6 3 D AROM;B. Flexion, scaption, horizontal abd x 3 ea MFR right upper arm, forearm, axilla PROM right shoulder flex, scaption, abd, IR and  ER Supine scapular series;flex, bilateral ER, supine horizontal abd x 5 with yellow. Pictures given. Advised not to overdo and to stick with 5 reps, and continue stretches 05/30/2024 Pt showed me her 2 sleeves one with gauntlet attached; both different sizes with no tag inside but low compression Supine wand flexion and scaption x 5 LTR x 4 B 3 D AROM;B. Flexion, scaption, horizontal abd, snow angels  x4 MFR right upper arm, forearm, axilla PROM right shoulder flex, scaption, abd, IR and ER Ball rolls on wall x 10 forward, 5 abd Wall arc to the left x 5 Pec wall stretch x3 right Updated HEP EVAL Educated pt about axillary Web syndrome and gave handout. Performed SOZO to r/o lymphedema and pt is in the green. Educated to perform supine AAflexion and stargazer in supine and gentle cord stretching with wrist extension, and  reeducated in wall slide. Discussed importance of preventing UT compensation for shoulder pain and showed how to depress scapula. Discussed benefits of compression sleeve for cording and lymphedema prevention. Will measure next visit.    PATIENT EDUCATION:  Access Code: [email or web address] URL: [email or web address] Date: 05/30/2024 Prepared by: Grayce Sheldon Supine scapular series;flex, bilateral ER, supine horizontal abd x 5 with yellow.(06/06/2024) Exercises - Supine Lower Trunk Rotation  - 2 x daily - 7 x weekly - 1 sets - 4 reps - Single Arm Doorway Pec Stretch at 90 Degrees Abduction  - 2 x daily - 7 x weekly - 1 sets - 3 reps - 20 hold - Standing Shoulder and Trunk Flexion at Table  - 2 x daily - 7 x weekly - 1 sets - 3 reps - 20 hold - Supine Shoulder Flexion Extension AAROM with Dowel  - 2 x daily - 7 x weekly - 1 sets - 15 reps Education details: see treatment today Person educated: Patient Education method: Explanation, Demonstration, and Handouts Education comprehension: verbalized understanding and returned demonstration  HOME EXERCISE PROGRAM: Supine scapular series;flex, bilateral ER, supine horizontal abd x 5 with yellow. supine AAflexion and stargazer in supine and gentle cord stretching with wrist extension, and reeducated in wall slide, scapular retraction. Advised to avoid UT compensation and shoulder pain and to perform 5 reps only 2-3 times/day to the point of tightness, not pain Supine Lower Trunk Rotation  - 2 x daily - 7 x weekly - 1 sets - 4 reps - Single Arm Doorway Pec Stretch at 90 Degrees Abduction  - 2 x daily - 7 x weekly - 1 sets - 3 reps - 20 hold - Standing Shoulder and Trunk Flexion at Table  - 2 x daily - 7 x weekly - 1 sets - 3 reps - 20 hold - Supine Shoulder Flexion Extension AAROM with Dowel  - 2 x daily - 7 x weekly - 1 sets - 15 reps ASSESSMENT:  CLINICAL IMPRESSION: Pt is compliant with her HEP nd doing very well. Cording still present and  crossing elbow but not very palpable or visible . Initiated light strengthening with limited reps and yellow band. May be ready for DC next visit or to return in 2 weeks.  EVAL Patient is a 63 y.o. female who was seen today for physical therapy evaluation and treatment for concerns of right shoulder pain and lymphedema... She presents with significant limitations in right shoulder ROM with complaints of chest tightness and medial arm/axillary tightness. Pt noted to have some cording extending down the arm to the wrist. There is no significant difference in circumference measurements today  and SOZO screen is well in the green, but cording may explain some of the swelling she has had. She has been over exercising  to the point of creating right shoulder pain with compensation. She will benefit from skilled PT to address deficits and return to PLOF    OBJECTIVE IMPAIRMENTS: decreased activity tolerance, decreased knowledge of condition, decreased ROM, decreased strength, increased edema, impaired UE functional use, postural dysfunction, and pain.   ACTIVITY LIMITATIONS: carrying, sleeping, dressing, reach over head, and hygiene/grooming  PARTICIPATION LIMITATIONS: cleaning, laundry, and yard work  PERSONAL FACTORS: 1-2 comorbidities: Right breast cancer s/p radiation are also affecting patient's functional outcome.   REHAB POTENTIAL: Good  CLINICAL DECISION MAKING: Stable/uncomplicated  EVALUATION COMPLEXITY: Moderate  GOALS: Goals reviewed with patient? Yes  SHORT TERM GOALS=LONG TERM GOALS: Target date: 06/28/2024  Pt will be independent in HEP for right shoulder ROM and strength Baseline: Goal status: MET 05/30/2024 2.  Pt will have decreased right shoulder/UE pain by atleast 50% for ability to perform household chores Baseline:  Goal status: MET 05/30/2024 3.  Right shoulder ROM will be within 5-10 degrees of left without increased pain for improved ability to reach into  cabinets Baseline:  Goal status: INITIAL  4.  Pts quick dash will improve to no greater than 20 % to demonstrate improvement in functional activities Baseline:  Goal status: INITIAL  PLAN:  PT FREQUENCY: 2x/week  PT DURATION: 6 weeks  PLANNED INTERVENTIONS: 97164- PT Re-evaluation, 97110-Therapeutic exercises, 97530- Therapeutic activity, 97112- Neuromuscular re-education, 97535- Self Care, [id number]- Manual therapy, 97760- Orthotic Initial, and Patient/Family education  PLAN FOR NEXT SESSION: check goals, DC or hold?STM prn ,MFR for cording, Cupping prn, PROM, review HEP and update as ready. Pt coming from [place].Set up SOZO screen next visit, check ROM/goals. Pt doing well. Cancel appts not needed.  Grayce JINNY Sheldon, PT 06/06/2024, 2:58 PM

## 2024-06-08 ENCOUNTER — Ambulatory Visit

## 2024-06-13 ENCOUNTER — Ambulatory Visit: Attending: Adult Health

## 2024-06-13 DIAGNOSIS — L7682 Other postprocedural complications of skin and subcutaneous tissue: Secondary | ICD-10-CM | POA: Insufficient documentation

## 2024-06-13 DIAGNOSIS — L905 Scar conditions and fibrosis of skin: Secondary | ICD-10-CM | POA: Insufficient documentation

## 2024-06-13 DIAGNOSIS — M25611 Stiffness of right shoulder, not elsewhere classified: Secondary | ICD-10-CM | POA: Diagnosis not present

## 2024-06-13 DIAGNOSIS — Z483 Aftercare following surgery for neoplasm: Secondary | ICD-10-CM | POA: Diagnosis not present

## 2024-06-13 DIAGNOSIS — Z170 Estrogen receptor positive status [ER+]: Secondary | ICD-10-CM | POA: Diagnosis not present

## 2024-06-13 DIAGNOSIS — C50311 Malignant neoplasm of lower-inner quadrant of right female breast: Secondary | ICD-10-CM | POA: Insufficient documentation

## 2024-06-13 DIAGNOSIS — M25511 Pain in right shoulder: Secondary | ICD-10-CM | POA: Insufficient documentation

## 2024-06-13 NOTE — Therapy (Addendum)
 OUTPATIENT PHYSICAL THERAPY  UPPER EXTREMITY ONCOLOGY TREATMENT  Patient Name: Lori Wise MRN: [id number] DOB:Apr 25, 1961, 63 y.o., female Today's Date: 06/13/2024  END OF SESSION:  PT End of Session - 06/13/24 1355     Visit Number 4    Number of Visits 12    Date for PT Re-Evaluation 06/28/24    Authorization Type [facility] Medicaid    Authorization Time Period 05/17/2024-07/16/2024    PT Start Time 1355    PT Stop Time 1503    PT Time Calculation (min) 68 min    Activity Tolerance Patient tolerated treatment well    Behavior During Therapy [facility] for tasks assessed/performed          Past Medical History:  Diagnosis Date   Acute laryngopharyngitis 04/19/2020   Anxiety 04/19/2020   Atypical nevus 04/19/2020   Bronchitis 04/30/2020   Chronic obstructive pulmonary disease (HCC) 10/22/2020   Chronic obstructive pulmonary disease with (acute) lower respiratory infection (HCC)    Encounter for immunization 04/19/2020   Essential (primary) hypertension    Gastroesophageal reflux disease 10/22/2020   Hormone replacement therapy (HRT) 10/22/2020   Malignant neoplasm of lower-inner quadrant of right female breast (HCC)    Migraine without aura, not intractable, without status migrainosus    Mixed hyperlipidemia    Other chest pain 03/21/2021   Other specified hypothyroidism 04/19/2020   Primary hypertension 03/21/2021   Right leg pain 07/23/2020   Past Surgical History:  Procedure Laterality Date   ABDOMINAL HYSTERECTOMY     BREAST BIOPSY Left 10/15/2023   MM LT BREAST BX W LOC DEV 1ST LESION IMAGE BX SPEC STEREO GUIDE 10/15/2023 [facility]   BREAST BIOPSY Right 10/15/2023   MM RT BREAST BX W LOC DEV 1ST LESION IMAGE BX SPEC STEREO GUIDE 10/15/2023 [facility]   BREAST BIOPSY  11/10/2023   MM RT RADIOACTIVE SEED LOC MAMMO GUIDE 11/10/2023 [facility]   BREAST LUMPECTOMY WITH RADIOACTIVE SEED AND SENTINEL LYMPH NODE BIOPSY Right 11/11/2023   Procedure: RIGHT  BREAST SEED LOCALIZED  LUMPECTOMY AND SENTINEL NODE BIOPSY;  Surgeon: Vernetta Berg, MD;  Location: [facility];  Service: General;  Laterality: Right;   CARDIAC CATHETERIZATION  2000   normal   CHOLECYSTECTOMY     NASAL HEMORRHAGE CONTROL     OTHER SURGICAL HISTORY     Ear surgeries x5   Patient Active Problem List   Diagnosis Date Noted   Hyperglycemia 01/11/2024   Genetic testing 11/04/2023   Malignant neoplasm of lower-inner quadrant of right breast of female, estrogen receptor positive (HCC) 10/19/2023   Epistaxis 05/06/2023   Acute cystitis with hematuria 04/16/2023   Neck pain 04/16/2023   Tobacco abuse 01/28/2023   Allergic rhinitis due to allergen 04/15/2022   Need for pneumococcal 20-valent conjugate vaccination 04/15/2022   Vitamin D  deficiency 04/15/2022   Smoking 08/30/2021   Chronic obstructive pulmonary disease with (acute) lower respiratory infection (HCC)    Essential (primary) hypertension    Migraine without aura, not intractable, without status migrainosus    Sunburn of second degree    Atypical chest pain 03/21/2021   Primary hypertension 03/21/2021   Chronic obstructive pulmonary disease (HCC) 10/22/2020   Hormone replacement therapy (HRT) 10/22/2020   GERD without esophagitis 10/22/2020   Right leg pain 07/23/2020   Bronchitis 04/30/2020   Mixed hyperlipidemia 04/19/2020   Other specified hypothyroidism 04/19/2020   Anxiety 04/19/2020   Acute laryngopharyngitis 04/19/2020   Encounter for immunization 04/19/2020   Atypical nevus 04/19/2020  REFERRING PROVIDER: Crawford Jacobsen, NP  REFERRING DIAG: s/p Right Breast Cancer  THERAPY DIAG:  Malignant neoplasm of lower-inner quadrant of right breast of female, estrogen receptor positive (HCC)  Stiffness of right shoulder, not elsewhere classified  Right shoulder pain, unspecified chronicity  Axillary web syndrome  Aftercare following surgery for neoplasm  ONSET DATE:  02/2024  Rationale for Evaluation and Treatment: Rehabilitation  SUBJECTIVE:                                                                                                                                                                                           SUBJECTIVE STATEMENT:  I was scrubbing my daughters shoes and she heard a pop. Could that have been a cord pop? I don't feel any tightness from the cords. No pain. No problems with anything at home. I used the weed eater but I took breaks. I have not tried my chain saw. EVAL When I saw Jacobsen she noticed there was some swelling in my right UE, and my hand swells some in the evening especially. I am having pain in her right shoulder. I get occasional sharp pains in my breast. I get some pain in the right axillary region too. 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 It measures 1.1 cm and is located in the lower inner quadrant. It is ER/PR positive and HER2 negative with a Ki67 of 3%. Rt lumpectomy and SLNB on 11/11/23 with 8 negative nodes removed.   PAIN:  Are you having pain? No NPRS scale: 0/10 Pain location: around right shoulder Pain orientation: Right  PAIN TYPE: sharp Pain description: intermittent  Aggravating factors: reaching overhead, housework like dusting,laying on right side, reaching behind back, year dwork Relieving factors: pregnancy pillow,800 ibuprofen , propping arm  PRECAUTIONS: Right UE lymphedema risk  RED FLAGS: None   WEIGHT BEARING RESTRICTIONS: No  FALLS:  Has patient fallen in last 6 months? No  LIVING ENVIRONMENT: Lives with: her adult daughter  Lives in: House/apartment   OCCUPATION: retired  LEISURE: gardening,  HAND DOMINANCE: right   PRIOR LEVEL OF FUNCTION: Independent  PATIENT GOALS:  To get back to where I was, work in yard, do  housework   OBJECTIVE: Note: Objective measures were completed at Evaluation unless otherwise noted.  COGNITION: Overall cognitive status: Within functional limits for tasks assessed   PALPATION: Tender right axillary region , chest and medial upper arm, several thin cords noted  OBSERVATIONS / OTHER ASSESSMENTS:   SENSATION: Light touch: Deficits  POSTURE: Forward head and rounded shoulders posture   UPPER EXTREMITY AROM/PROM:  A/PROM RIGHT   eval  RIGHT 05/30/2024 RIGHT 06/13/2024  Shoulder extension 32,+chest 44 53  Shoulder flexion 112,+chest 148 160  Shoulder abduction 130, chest, shoulder 157 171  Shoulder internal rotation 55, shoulder  62  Shoulder external rotation 85  114    (Blank rows = not tested)  A/PROM LEFT   eval  Shoulder extension 46  Shoulder flexion 160  Shoulder abduction 175  Shoulder internal rotation 58  Shoulder external rotation 98    (Blank rows = not tested)  CERVICAL AROM: All within functional limits:  UPPER EXTREMITY STRENGTH:   LYMPHEDEMA ASSESSMENTS:   SURGERY TYPE/DATE: Rt lumpectomy and SLNB on 11/11/23  NUMBER OF LYMPH NODES REMOVED: 0+/8  CHEMOTHERAPY: no  RADIATION:Yes, ended 01/27/2024  HORMONE TREATMENT: Yes, Anastrozole   :    LYMPHEDEMA ASSESSMENTS:   LANDMARK RIGHT  eval  At axilla  30.4  15 cm proximal to olecranon process 30.6  10 cm proximal to olecranon process 29.1  Olecranon process 23.7  15 cm proximal to ulnar styloid process 22.6  10 cm proximal to ulnar styloid process 20.7  Just proximal to ulnar styloid process 14.7  Across hand at thumb web space 18.3  At base of 2nd digit 5.5  (Blank rows = not tested)  LANDMARK LEFT  eval  At axilla  29.5  15 cm proximal to olecranon process 29.5  10 cm proximal to olecranon process 27.6  Olecranon process 23.4  15 cm proximal to ulnar styloid process 22.3  10 cm proximal to ulnar styloid process 19.3  Just proximal to ulnar styloid process 14.7   Across hand at thumb web space 18.35  At base of 2nd digit 5.5  (Blank rows = not tested)   FUNCTIONAL TESTS:    GAIT:WNL  L-DEX LYMPHEDEMA SCREENING: The patient was assessed using the L-Dex machine today to produce a lymphedema index baseline score. The patient will be reassessed on a regular basis (typically every 3 months) to obtain new L-Dex scores. If the score is > 6.5 points away from his/her baseline score indicating onset of subclinical lymphedema, it will be recommended to wear a compression garment for 4 weeks, 12 hours per day and then be reassessed. If the score continues to be > 6.5 points from baseline at reassessment, we will initiate lymphedema treatment. Assessing in this manner has a 95% rate of preventing clinically significant lymphedema.  QUICK DASH SURVEY: 50% EVAL, 06/13/2024 6.82                                                                                                                            TREATMENT DATE:   06/13/2024 Ball rolls on wall x 10 forward, 5 abd Wall arc to the left x 5 and right Jobes flexion and scaption 1# x 10 ea Right pectoral stretch at wall x 3, 20 sec Snow angels x 5 Supine scapular  series; flexion, horizontal abd, bilateral ER yellow x 7 ea MFR to right axilla, upper arm and forearm; no significant cords found PROM Right shoulder flex, abd, IR and ER Measured AROM and had pt complete quick dash. Updated HEP with pec wall stretch and jobes flex and scaption 06/06/2024 Ball rolls on wall x 10 forward, 5 abd Wall arc to the left x 5 IR behind back with strap x 5 Snow angels x 6 3 D AROM;B. Flexion, scaption, horizontal abd x 3 ea MFR right upper arm, forearm, axilla PROM right shoulder flex, scaption, abd, IR and ER Supine scapular series;flex, bilateral ER, supine horizontal abd x 5 with yellow. Pictures given. Advised not to overdo and to stick with 5 reps, and continue stretches 05/30/2024 Pt showed me her 2 sleeves one with  gauntlet attached; both different sizes with no tag inside but low compression Supine wand flexion and scaption x 5 LTR x 4 B 3 D AROM;B. Flexion, scaption, horizontal abd, snow angels  x4 MFR right upper arm, forearm, axilla PROM right shoulder flex, scaption, abd, IR and ER Ball rolls on wall x 10 forward, 5 abd Wall arc to the left x 5 Pec wall stretch x3 right Updated HEP EVAL Educated pt about axillary Web syndrome and gave handout. Performed SOZO to r/o lymphedema and pt is in the green. Educated to perform supine AAflexion and stargazer in supine and gentle cord stretching with wrist extension, and reeducated in wall slide. Discussed importance of preventing UT compensation for shoulder pain and showed how to depress scapula. Discussed benefits of compression sleeve for cording and lymphedema prevention. Will measure next visit.    PATIENT EDUCATION:  Access Code: [email or web address] URL: [email or web address] Date: 05/30/2024 Prepared by: Grayce Sheldon Supine scapular series;flex, bilateral ER, supine horizontal abd x 5 with yellow.(06/06/2024) Exercises - Supine Lower Trunk Rotation  - 2 x daily - 7 x weekly - 1 sets - 4 reps - Single Arm Doorway Pec Stretch at 90 Degrees Abduction  - 2 x daily - 7 x weekly - 1 sets - 3 reps - 20 hold - Standing Shoulder and Trunk Flexion at Table  - 2 x daily - 7 x weekly - 1 sets - 3 reps - 20 hold - Supine Shoulder Flexion Extension AAROM with Dowel  - 2 x daily - 7 x weekly - 1 sets - 15 reps Education details: see treatment today Person educated: Patient Education method: Explanation, Demonstration, and Handouts Education comprehension: verbalized understanding and returned demonstration  HOME EXERCISE PROGRAM: Supine scapular series;flex, bilateral ER, supine horizontal abd x 5 with yellow. supine AAflexion and stargazer in supine and gentle cord stretching with wrist extension, and reeducated in wall slide, scapular retraction.  Advised to avoid UT compensation and shoulder pain and to perform 5 reps only 2-3 times/day to the point of tightness, not pain Supine Lower Trunk Rotation  - 2 x daily - 7 x weekly - 1 sets - 4 reps - Single Arm Doorway Pec Stretch at 90 Degrees Abduction  - 2 x daily - 7 x weekly - 1 sets - 3 reps - 20 hold - Standing Shoulder and Trunk Flexion at Table  - 2 x daily - 7 x weekly - 1 sets - 3 reps - 20 hold - Supine Shoulder Flexion Extension AAROM with Dowel  - 2 x daily - 7 x weekly - 1 sets - 15 reps ASSESSMENT:  CLINICAL IMPRESSION: Pt is doing very well overall and she has achieved  all goals. She notes no limitations with home activities. She was cautioned not to over do it at home, and to progress with her activities slowly so as not to aggravate cording. We will follow up with her in 2 weeks and if she continues to do well she will be discharged. She was advised to call if she has any problems before then and may call 24-48 hours in advance if she is feeling fine and leave a message that we may discharge her. She verbalized understanding. There are no visible or palpable cords today and ROM is now WNL. EVAL Patient is a 63 y.o. female who was seen today for physical therapy evaluation and treatment for concerns of right shoulder pain and lymphedema... She presents with significant limitations in right shoulder ROM with complaints of chest tightness and medial arm/axillary tightness. Pt noted to have some cording extending down the arm to the wrist. There is no significant difference in circumference measurements today and SOZO screen is well in the green, but cording may explain some of the swelling she has had. She has been over exercising  to the point of creating right shoulder pain with compensation. She will benefit from skilled PT to address deficits and return to PLOF    OBJECTIVE IMPAIRMENTS: decreased activity tolerance, decreased knowledge of condition, decreased ROM, decreased  strength, increased edema, impaired UE functional use, postural dysfunction, and pain.   ACTIVITY LIMITATIONS: carrying, sleeping, dressing, reach over head, and hygiene/grooming  PARTICIPATION LIMITATIONS: cleaning, laundry, and yard work  PERSONAL FACTORS: 1-2 comorbidities: Right breast cancer s/p radiation are also affecting patient's functional outcome.   REHAB POTENTIAL: Good  CLINICAL DECISION MAKING: Stable/uncomplicated  EVALUATION COMPLEXITY: Moderate  GOALS: Goals reviewed with patient? Yes  SHORT TERM GOALS=LONG TERM GOALS: Target date: 06/28/2024  Pt will be independent in HEP for right shoulder ROM and strength Baseline: Goal status: MET 05/30/2024 2.  Pt will have decreased right shoulder/UE pain by atleast 50% for ability to perform household chores Baseline:  Goal status: MET 05/30/2024 3.  Right shoulder ROM will be within 5-10 degrees of left without increased pain for improved ability to reach into cabinets Baseline:  Goal status: MET 06/13/2024 4.  Pts quick dash will improve to no greater than 20 % to demonstrate improvement in functional activities Baseline:  Goal status: MET  06/13/2024 PLAN:  PT FREQUENCY: 2x/week  PT DURATION: 6 weeks  PLANNED INTERVENTIONS: 97164- PT Re-evaluation, 97110-Therapeutic exercises, 97530- Therapeutic activity, 97112- Neuromuscular re-education, 97535- Self Care, [id number]- Manual therapy, 97760- Orthotic Initial, and Patient/Family education  PLAN FOR NEXT SESSION: has achieved all goals, DC if still doing well,MFR for cording prn, Cupping prn, PROM, review HEP and update as ready. Pt coming from [place].Set up SOZO screen next visit, May call to cancel in advance and will advise us  if she may be discharged PHYSICAL THERAPY DISCHARGE SUMMARY  Visits from Start of Care: 4  Current functional level related to goals / functional outcomes: Achieved all goals   Remaining deficits: Cording is still mildly present but is not  limiting pt   Education / Equipment: HEP   Patient agrees to discharge. Patient goals were met. Patient is being discharged due to meeting the stated rehab goals.  Grayce JINNY Sheldon, PT 06/13/2024, 5:32 PM

## 2024-06-14 ENCOUNTER — Other Ambulatory Visit: Payer: Self-pay | Admitting: Physician Assistant

## 2024-06-14 DIAGNOSIS — I10 Essential (primary) hypertension: Secondary | ICD-10-CM

## 2024-06-15 ENCOUNTER — Ambulatory Visit

## 2024-06-18 DIAGNOSIS — Z419 Encounter for procedure for purposes other than remedying health state, unspecified: Secondary | ICD-10-CM | POA: Diagnosis not present

## 2024-06-20 ENCOUNTER — Encounter

## 2024-06-22 ENCOUNTER — Encounter

## 2024-06-27 ENCOUNTER — Ambulatory Visit

## 2024-06-27 ENCOUNTER — Other Ambulatory Visit: Payer: Self-pay | Admitting: Family Medicine

## 2024-06-27 DIAGNOSIS — F419 Anxiety disorder, unspecified: Secondary | ICD-10-CM

## 2024-06-29 ENCOUNTER — Encounter

## 2024-07-14 ENCOUNTER — Other Ambulatory Visit: Payer: Self-pay | Admitting: Physician Assistant

## 2024-07-14 DIAGNOSIS — K219 Gastro-esophageal reflux disease without esophagitis: Secondary | ICD-10-CM

## 2024-07-16 ENCOUNTER — Other Ambulatory Visit: Payer: Self-pay | Admitting: Physician Assistant

## 2024-07-19 DIAGNOSIS — Z419 Encounter for procedure for purposes other than remedying health state, unspecified: Secondary | ICD-10-CM | POA: Diagnosis not present

## 2024-07-27 ENCOUNTER — Other Ambulatory Visit: Payer: Self-pay | Admitting: Physician Assistant

## 2024-07-27 DIAGNOSIS — F419 Anxiety disorder, unspecified: Secondary | ICD-10-CM

## 2024-08-05 DIAGNOSIS — D485 Neoplasm of uncertain behavior of skin: Secondary | ICD-10-CM | POA: Diagnosis not present

## 2024-08-05 DIAGNOSIS — D2239 Melanocytic nevi of other parts of face: Secondary | ICD-10-CM | POA: Diagnosis not present

## 2024-08-05 DIAGNOSIS — D225 Melanocytic nevi of trunk: Secondary | ICD-10-CM | POA: Diagnosis not present

## 2024-08-05 DIAGNOSIS — L578 Other skin changes due to chronic exposure to nonionizing radiation: Secondary | ICD-10-CM | POA: Diagnosis not present

## 2024-08-05 DIAGNOSIS — L821 Other seborrheic keratosis: Secondary | ICD-10-CM | POA: Diagnosis not present

## 2024-08-09 ENCOUNTER — Inpatient Hospital Stay: Attending: Adult Health | Admitting: Hematology and Oncology

## 2024-08-09 VITALS — BP 134/57 | HR 79 | Temp 97.6°F | Resp 16 | Wt 156.4 lb

## 2024-08-09 DIAGNOSIS — Z881 Allergy status to other antibiotic agents status: Secondary | ICD-10-CM | POA: Insufficient documentation

## 2024-08-09 DIAGNOSIS — Z885 Allergy status to narcotic agent status: Secondary | ICD-10-CM | POA: Insufficient documentation

## 2024-08-09 DIAGNOSIS — M810 Age-related osteoporosis without current pathological fracture: Secondary | ICD-10-CM | POA: Insufficient documentation

## 2024-08-09 DIAGNOSIS — Z1732 Human epidermal growth factor receptor 2 negative status: Secondary | ICD-10-CM | POA: Insufficient documentation

## 2024-08-09 DIAGNOSIS — Z7982 Long term (current) use of aspirin: Secondary | ICD-10-CM | POA: Insufficient documentation

## 2024-08-09 DIAGNOSIS — Z79811 Long term (current) use of aromatase inhibitors: Secondary | ICD-10-CM | POA: Diagnosis not present

## 2024-08-09 DIAGNOSIS — Z1721 Progesterone receptor positive status: Secondary | ICD-10-CM | POA: Insufficient documentation

## 2024-08-09 DIAGNOSIS — Z79899 Other long term (current) drug therapy: Secondary | ICD-10-CM | POA: Diagnosis not present

## 2024-08-09 DIAGNOSIS — C50311 Malignant neoplasm of lower-inner quadrant of right female breast: Secondary | ICD-10-CM | POA: Diagnosis not present

## 2024-08-09 DIAGNOSIS — Z170 Estrogen receptor positive status [ER+]: Secondary | ICD-10-CM | POA: Insufficient documentation

## 2024-08-09 DIAGNOSIS — N644 Mastodynia: Secondary | ICD-10-CM | POA: Insufficient documentation

## 2024-08-09 NOTE — Progress Notes (Signed)
 Patient Care Team: Nicholaus Credit, [name] as PCP - General (Physician Assistant) Tobb, Kardie, DO as PCP - Cardiology (Cardiology) Glean Stephane BROCKS, RN (Inactive) as Oncology Nurse Navigator Tyree Nanetta SAILOR, RN as Oncology Nurse Navigator Odean Potts, MD as Consulting Physician (Hematology and Oncology) Vernetta Berg, MD as Consulting Physician (General Surgery) Shannon Agent, MD as Consulting Physician (Radiation Oncology)  DIAGNOSIS:  Encounter Diagnosis  Name Primary?   Malignant neoplasm of lower-inner quadrant of right breast of female, estrogen receptor positive (HCC) Yes    SUMMARY OF ONCOLOGIC HISTORY: Oncology History  Malignant neoplasm of lower-inner quadrant of right breast of female, estrogen receptor positive (HCC)  10/15/2023 Initial Diagnosis   Screening mammogram detected right breast distortion at 6:00 measuring 1.1 cm, stereotactic biopsy: Grade 1 ILC ER 95%, PR 95%, Ki-67 3%, HER2 negative, axilla negative (left breast biopsy: Benign)   10/21/2023 Cancer Staging   Staging form: Breast, AJCC 8th Edition - Clinical: Stage IA (cT1c, cN0, cM0, G1, ER+, PR+, HER2-) - Signed by Odean Potts, MD on 10/21/2023 Stage prefix: Initial diagnosis Histologic grading system: 3 grade system    Genetic Testing   [facility] CancerNext-Expanded Panel+RNA was Negative. Report date is 11/13/2023.   The CancerNext-Expanded gene panel offered by [facility] and includes sequencing, rearrangement, and RNA analysis for the following 76 genes: AIP, ALK, APC, ATM, AXIN2, BAP1, BARD1, BMPR1A, BRCA1, BRCA2, BRIP1, CDC73, CDH1, CDK4, CDKN1B, CDKN2A, CEBPA, CHEK2, CTNNA1, DDX41, DICER1, ETV6, FH, FLCN, GATA2, LZTR1, MAX, MBD4, MEN1, MET, MLH1, MSH2, MSH3, MSH6, MUTYH, NF1, NF2, NTHL1, PALB2, PHOX2B, PMS2, POT1, PRKAR1A, PTCH1, PTEN, RAD51C, RAD51D, RB1, RET, RUNX1, SDHA, SDHAF2, SDHB, SDHC, SDHD, SMAD4, SMARCA4, SMARCB1, SMARCE1, STK11, SUFU, TMEM127, TP53, TSC1, TSC2, VHL, and WT1 (sequencing  and deletion/duplication); EGFR, HOXB13, KIT, MITF, PDGFRA, POLD1, and POLE (sequencing only); EPCAM and GREM1 (deletion/duplication only).     11/11/2023 Surgery   Right lumpectomy: Grade 1 ILC 1.4 cm with LCIS, negative for lymphovascular invasion, 0/8 lymph nodes negative, ER 95%, PR 95%, HER2 1+ negative, Ki-67 3%   12/29/2023 - 01/27/2024 Radiation Therapy   First Treatment Date: 2023-12-29 Last Treatment Date: 2024-01-27   Plan Name: Breast_R Site: Breast, Right Technique: 3D Mode: Photon Dose Per Fraction: 2.66 Gy Prescribed Dose (Delivered / Prescribed): 42.56 Gy / 42.56 Gy Prescribed Fxs (Delivered / Prescribed): 16 / 16   Plan Name: Breast_R_Bst Site: Breast, Right Technique: Electron Mode: Electron Dose Per Fraction: 2 Gy Prescribed Dose (Delivered / Prescribed): 10 Gy / 10 Gy Prescribed Fxs (Delivered / Prescribed): 5 / 5   01/2024 -  Anti-estrogen oral therapy   Anastrozole      CHIEF COMPLIANT: Follow-up on half a tablet of anastrozole   HISTORY OF PRESENT ILLNESS:  History of Present Illness Lori Wise is a 63 year old female with osteoporosis who presents for follow-up on anastrozole  treatment and bone health management.  She has been on anastrozole  since March 2025 and experiences manageable hot flashes since discontinuing estrogen replacement therapy. She has occasional breast pain without significant discomfort.  A bone density test was performed on May 24, 2024. She currently takes calcium  and vitamin D  supplements.     ALLERGIES:  is allergic to codeine, macrobid  [nitrofurantoin ], and ativan [lorazepam].  MEDICATIONS:  Current Outpatient Medications  Medication Sig Dispense Refill   albuterol  (VENTOLIN  HFA) 108 (90 Base) MCG/ACT inhaler Inhale 2 puffs into the lungs every 6 (six) hours as needed for wheezing or shortness of breath. 8 g 2   ALPRAZolam  (XANAX ) 0.5  MG tablet TAKE ONE TABLET BY MOUTH 3 TIMES DAILY 90 tablet 0   anastrozole  (ARIMIDEX ) 1  MG tablet Take 1 tablet (1 mg total) by mouth daily. 90 tablet 3   Ascorbic Acid (VITAMIN C PO) Take 1 tablet by mouth daily. Unknown strength     aspirin EC 81 MG tablet Take 81 mg by mouth daily. Swallow whole.     atenolol  (TENORMIN ) 25 MG tablet TAKE ONE TABLET BY MOUTH TWICE DAILY 60 tablet 5   cetirizine  (ZYRTEC ) 10 MG tablet TAKE ONE TABLET BY MOUTH DAILY 30 tablet 11   Cholecalciferol (VITAMIN D3) 25 MCG (1000 UT) CAPS Take 1 Units by mouth daily.     Cyanocobalamin (VITAMIN B-12 PO) Take 1 tablet by mouth daily. Unknown strenght     Dermatological Products, Misc. (STRATA XRT) GEL Apply topically daily.     Emollient (ROC RETINOL CORREXION EX) Apply topically.     ezetimibe  (ZETIA ) 10 MG tablet TAKE ONE TABLET BY MOUTH EVERY DAY 30 tablet 5   Folic Acid-Vit B6-Vit B12 (FOLBEE) 2.5-25-1 MG TABS tablet Take 1 tablet by mouth daily.     ibuprofen  (ADVIL ) 800 MG tablet Take 1 tablet (800 mg total) by mouth every 8 (eight) hours as needed for mild pain (pain score 1-3) or moderate pain (pain score 4-6). 90 tablet 1   levothyroxine (SYNTHROID) 25 MCG tablet TAKE ONE TABLET BY MOUTH EVERY DAY 90 tablet 1   Omega-3 1000 MG CAPS Take 1 capsule by mouth daily.     omeprazole  (PRILOSEC) 20 MG capsule Take 1 capsule (20 mg total) by mouth 2 (two) times daily before a meal. 180 capsule 1   rosuvastatin  (CRESTOR ) 5 MG tablet Take 1 tablet (5 mg total) by mouth daily. 90 tablet 1   tretinoin (RETIN-A) 0.05 % cream Apply topically at bedtime.     VITAMIN E PO Take 1 tablet by mouth daily at 6 (six) AM. Unknown strength     ipratropium-albuterol  (DUONEB) 0.5-2.5 (3) MG/3ML SOLN Take 3 mLs by nebulization every 4 (four) hours as needed. (Patient not taking: Reported on 08/09/2024) 360 mL 3   Current Facility-Administered Medications  Medication Dose Route Frequency Provider Last Rate Last Admin   triamcinolone  acetonide (KENALOG -40) injection 60 mg  60 mg Intramuscular Once         PHYSICAL  EXAMINATION: ECOG PERFORMANCE STATUS: 1 - Symptomatic but completely ambulatory  Vitals:   08/09/24 1425  BP: (!) 134/57  Pulse: 79  Resp: 16  Temp: 97.6 F (36.4 C)  SpO2: 99%   Filed Weights   08/09/24 1425  Weight: 156 lb 6.4 oz (70.9 kg)    Physical Exam   (exam performed in the presence of a chaperone)  LABORATORY DATA:  I have reviewed the data as listed    Latest Ref Rng & Units 05/12/2024    9:55 AM 05/05/2024    2:41 PM 01/11/2024    8:50 AM  CMP  Glucose 70 - 99 mg/dL 897  91  89   BUN 8 - 27 mg/dL 17  20  15    Creatinine 0.57 - 1.00 mg/dL 9.21  9.22  9.19   Sodium 134 - 144 mmol/L 140  142  141   Potassium 3.5 - 5.2 mmol/L 4.4  4.2  4.3   Chloride 96 - 106 mmol/L 105  106  106   CO2 20 - 29 mmol/L 20  30  21    Calcium  8.7 - 10.3 mg/dL 10.0  10.1  9.7   Total Protein 6.0 - 8.5 g/dL 6.5  7.0  6.3   Total Bilirubin 0.0 - 1.2 mg/dL <9.7  0.3  0.4   Alkaline Phos 44 - 121 IU/L 96  71  77   AST 0 - 40 IU/L 23  27  20    ALT 0 - 32 IU/L 22  25  18      Lab Results  Component Value Date   WBC 10.5 05/12/2024   HGB 12.1 05/12/2024   HCT 37.5 05/12/2024   MCV 95 05/12/2024   PLT 325 05/12/2024   NEUTROABS 5.5 05/12/2024    ASSESSMENT & PLAN:  Malignant neoplasm of lower-inner quadrant of right breast of female, estrogen receptor positive (HCC) 10/15/2023:Screening mammogram detected right breast distortion at 6:00 measuring 1.1 cm, stereotactic biopsy: Grade 1 ILC ER 95%, PR 95%, Ki-67 3%, HER2 negative, axilla negative (left breast biopsy: Benign)   11/11/2023:Right lumpectomy: Grade 1 ILC 1.4 cm with LCIS, negative for lymphovascular invasion, 0/8 lymph nodes negative, ER 95%, PR 95%, HER2 1+ negative, Ki-67 3% Oncotype DX recurrence score: 15 (4% risk of distant recurrence)   Recommendations: 1. Adjuvant radiation therapy 12/31/2023-01/27/2024 2. Adjuvant antiestrogen therapy: Anastrozole  started 02/01/24   Estrogen replacement therapy: Patient was able to  come off of estrogen replacement therapy.   Anastrozole  Toxicities: Tolerating extremely well without any problems or concerns she is able to take half a tablet of anastrozole  fairly well.  Breast Cancer Surveillance: Breast Exam: 08/09/24: Benign Mammogram appointment will be scheduled to be done in [place] in 1 month  Osteoporosis: T-score -3: Recommended bisphosphonate therapy.  She chose to receive Zometa infusions once a year.  We will set her up for Zometa in October when she comes back for her rehab appointment.  Subsequently she will be seen by us  once a year along with labs and Zometa.  Assessment & Plan Estrogen receptor positive malignant neoplasm of lower-inner quadrant of right breast On anastrozole  for six months with manageable hot flashes and occasional breast pain, common post-treatment. - Change mamm[facility] appointment location to [facility]. - Ensure mammogram is scheduled for October.  Age-related osteoporosis without current pathological fracture Bone density test on June 17th indicates osteoporosis. Discussed treatment options: annual Zometa infusion or weekly oral medication. Chose annual infusion due to medication burden. Zometa requires adequate calcium  and vitamin D  intake. Discussed potential side effects: esophageal irritation from the pill, rare jaw issues with both treatments. Infusion involves a 30-minute procedure with 30-minute observation. Risk of fracture is 20% if untreated. - Schedule Zometa infusion. - Ensure adequate calcium  and vitamin D  intake. - Coordinate infusion appointment with other medical appointments if possible.      No orders of the defined types were placed in this encounter.  The patient has a good understanding of the overall plan. she agrees with it. she will call with any problems that may develop before the next visit here. Total time spent: 30 mins including face to face time and time spent for planning, charting and co-ordination of  care   Naomi MARLA Chad, MD 08/09/24

## 2024-08-09 NOTE — Assessment & Plan Note (Signed)
 10/15/2023:Screening mammogram detected right breast distortion at 6:00 measuring 1.1 cm, stereotactic biopsy: Grade 1 ILC ER 95%, PR 95%, Ki-67 3%, HER2 negative, axilla negative (left breast biopsy: Benign)   11/11/2023:Right lumpectomy: Grade 1 ILC 1.4 cm with LCIS, negative for lymphovascular invasion, 0/8 lymph nodes negative, ER 95%, PR 95%, HER2 1+ negative, Ki-67 3% Oncotype DX recurrence score: 15 (4% risk of distant recurrence)   Recommendations: 1. Adjuvant radiation therapy 12/31/2023-01/27/2024 2. Adjuvant antiestrogen therapy: Anastrozole  started 02/01/24   Estrogen replacement therapy: Patient was able to come off of estrogen replacement therapy.   Anastrozole  Toxicities:  Breast Cancer Surveillance: Breast Exam: 08/09/24: Benign

## 2024-08-19 DIAGNOSIS — Z419 Encounter for procedure for purposes other than remedying health state, unspecified: Secondary | ICD-10-CM | POA: Diagnosis not present

## 2024-08-26 ENCOUNTER — Other Ambulatory Visit: Payer: Self-pay | Admitting: Physician Assistant

## 2024-08-26 DIAGNOSIS — F419 Anxiety disorder, unspecified: Secondary | ICD-10-CM

## 2024-09-02 DIAGNOSIS — D485 Neoplasm of uncertain behavior of skin: Secondary | ICD-10-CM | POA: Diagnosis not present

## 2024-09-05 ENCOUNTER — Other Ambulatory Visit: Payer: Self-pay | Admitting: Medical Genetics

## 2024-09-12 ENCOUNTER — Encounter: Payer: Self-pay | Admitting: Physician Assistant

## 2024-09-12 ENCOUNTER — Ambulatory Visit (INDEPENDENT_AMBULATORY_CARE_PROVIDER_SITE_OTHER): Admitting: Physician Assistant

## 2024-09-12 VITALS — BP 122/84 | HR 82 | Temp 97.8°F | Resp 18 | Ht 64.0 in | Wt 159.8 lb

## 2024-09-12 DIAGNOSIS — F419 Anxiety disorder, unspecified: Secondary | ICD-10-CM

## 2024-09-12 DIAGNOSIS — M810 Age-related osteoporosis without current pathological fracture: Secondary | ICD-10-CM | POA: Diagnosis not present

## 2024-09-12 DIAGNOSIS — Z23 Encounter for immunization: Secondary | ICD-10-CM

## 2024-09-12 DIAGNOSIS — K219 Gastro-esophageal reflux disease without esophagitis: Secondary | ICD-10-CM

## 2024-09-12 DIAGNOSIS — E038 Other specified hypothyroidism: Secondary | ICD-10-CM | POA: Diagnosis not present

## 2024-09-12 DIAGNOSIS — E782 Mixed hyperlipidemia: Secondary | ICD-10-CM

## 2024-09-12 DIAGNOSIS — C50911 Malignant neoplasm of unspecified site of right female breast: Secondary | ICD-10-CM

## 2024-09-12 DIAGNOSIS — E559 Vitamin D deficiency, unspecified: Secondary | ICD-10-CM

## 2024-09-12 DIAGNOSIS — I10 Essential (primary) hypertension: Secondary | ICD-10-CM | POA: Diagnosis not present

## 2024-09-12 DIAGNOSIS — R7303 Prediabetes: Secondary | ICD-10-CM | POA: Diagnosis not present

## 2024-09-12 NOTE — Progress Notes (Signed)
 Subjective:  Patient ID: Lori Wise, female    DOB: [birth year]  Age: 63 y.o. MRN: [id number]  Chief Complaint  Patient presents with   Medical Management of Chronic Issues    .  Hyperlipidemia    Pt presents for follow up of hypertension. The patient is tolerating the medication well without side effects. Compliance with treatment has been good; including taking medication as directed , maintains a healthy diet and regular exercise regimen , and following up as directed. She is currently on tenormin  25mg  1 po bid Denies chest pain or dyspnea  Pt with  asthma/COPD - is having no problems with cough/congestion or wheezing She currently is not using her nebulizer or inhalers She had lung CT done 6/25 - results stable She quit smoking 05/28/23  Pt with GERD- sympotms stable on prilosec 20mg  qd -   Mixed hyperlipidemia  Pt presents with hyperlipidemia.  The patient is compliant with medications, maintains a low cholesterol diet , follows up as directed , and maintains an exercise regimen . The patient denies experiencing any hypercholesterolemia related symptoms. Pt currently on crestor  5mg  qd and zetia  10mg  qd - she also takes fish oil qd  Pt with hypothyroidism - due for labwork - taking synthroid 25mcg qd - voices no concerns  Pt with anxiety - symptoms stable on alprazolam  that she takes as needed  pt has been diagnosed with breast cancer of right breast - currently following with oncology and is on arimidex  therapy  Pt had dexa scan 6/25 and diagnosed with osteoporosis - she has been set up for Zometa infusion therapy that will begin 10/03/24  Pt would like flu shot today Current Outpatient Medications on File Prior to Visit  Medication Sig Dispense Refill   albuterol  (VENTOLIN  HFA) 108 (90 Base) MCG/ACT inhaler Inhale 2 puffs into the lungs every 6 (six) hours as needed for wheezing or shortness of breath. 8 g 2   ALPRAZolam  (XANAX ) 0.5 MG tablet TAKE ONE TABLET BY MOUTH 3  TIMES DAILY 90 tablet 0   anastrozole  (ARIMIDEX ) 1 MG tablet Take 1 tablet (1 mg total) by mouth daily. 90 tablet 3   Ascorbic Acid (VITAMIN C PO) Take 1 tablet by mouth daily. Unknown strength     aspirin EC 81 MG tablet Take 81 mg by mouth daily. Swallow whole.     atenolol  (TENORMIN ) 25 MG tablet TAKE ONE TABLET BY MOUTH TWICE DAILY 60 tablet 5   cetirizine  (ZYRTEC ) 10 MG tablet TAKE ONE TABLET BY MOUTH DAILY 30 tablet 11   Cholecalciferol (VITAMIN D3) 25 MCG (1000 UT) CAPS Take 1 Units by mouth daily.     Cyanocobalamin (VITAMIN B-12 PO) Take 1 tablet by mouth daily. Unknown strenght     Dermatological Products, Misc. (STRATA XRT) GEL Apply topically daily.     Emollient (ROC RETINOL CORREXION EX) Apply topically.     ezetimibe  (ZETIA ) 10 MG tablet TAKE ONE TABLET BY MOUTH EVERY DAY 30 tablet 5   Folic Acid-Vit B6-Vit B12 (FOLBEE) 2.5-25-1 MG TABS tablet Take 1 tablet by mouth daily.     ibuprofen  (ADVIL ) 800 MG tablet Take 1 tablet (800 mg total) by mouth every 8 (eight) hours as needed for mild pain (pain score 1-3) or moderate pain (pain score 4-6). 90 tablet 1   ipratropium-albuterol  (DUONEB) 0.5-2.5 (3) MG/3ML SOLN Take 3 mLs by nebulization every 4 (four) hours as needed. 360 mL 3   levothyroxine (SYNTHROID) 25 MCG tablet TAKE ONE TABLET BY  MOUTH EVERY DAY 90 tablet 1   Omega-3 1000 MG CAPS Take 1 capsule by mouth daily.     omeprazole  (PRILOSEC) 20 MG capsule Take 1 capsule (20 mg total) by mouth 2 (two) times daily before a meal. 180 capsule 1   rosuvastatin  (CRESTOR ) 5 MG tablet Take 1 tablet (5 mg total) by mouth daily. 90 tablet 1   tretinoin (RETIN-A) 0.05 % cream Apply topically at bedtime.     VITAMIN E PO Take 1 tablet by mouth daily at 6 (six) AM. Unknown strength     Current Facility-Administered Medications on File Prior to Visit  Medication Dose Route Frequency Provider Last Rate Last Admin   triamcinolone  acetonide (KENALOG -40) injection 60 mg  60 mg Intramuscular Once         Past Medical History:  Diagnosis Date   Acute laryngopharyngitis 04/19/2020   Anxiety 04/19/2020   Atypical nevus 04/19/2020   Bronchitis 04/30/2020   Chronic obstructive pulmonary disease (HCC) 10/22/2020   Chronic obstructive pulmonary disease with (acute) lower respiratory infection (HCC)    Encounter for immunization 04/19/2020   Essential (primary) hypertension    Gastroesophageal reflux disease 10/22/2020   Hormone replacement therapy (HRT) 10/22/2020   Malignant neoplasm of lower-inner quadrant of right female breast (HCC)    Migraine without aura, not intractable, without status migrainosus    Mixed hyperlipidemia    Other chest pain 03/21/2021   Other specified hypothyroidism 04/19/2020   Primary hypertension 03/21/2021   Right leg pain 07/23/2020   Past Surgical History:  Procedure Laterality Date   ABDOMINAL HYSTERECTOMY     BREAST BIOPSY Left 10/15/2023   MM LT BREAST BX W LOC DEV 1ST LESION IMAGE BX SPEC STEREO GUIDE 10/15/2023 [facility]   BREAST BIOPSY Right 10/15/2023   MM RT BREAST BX W LOC DEV 1ST LESION IMAGE BX SPEC STEREO GUIDE 10/15/2023 [facility]   BREAST BIOPSY  11/10/2023   MM RT RADIOACTIVE SEED LOC MAMMO GUIDE 11/10/2023 [facility]   BREAST LUMPECTOMY WITH RADIOACTIVE SEED AND SENTINEL LYMPH NODE BIOPSY Right 11/11/2023   Procedure: RIGHT BREAST SEED LOCALIZED  LUMPECTOMY AND SENTINEL NODE BIOPSY;  Surgeon: Vernetta Berg, MD;  Location: [facility];  Service: General;  Laterality: Right;   CARDIAC CATHETERIZATION  2000   normal   CHOLECYSTECTOMY     NASAL HEMORRHAGE CONTROL     OTHER SURGICAL HISTORY     Ear surgeries x5    Family History  Problem Relation Age of Onset   Suicidality Mother    Lung cancer Father 57       smoked   Lung cancer Sister 88       smoked   Brain cancer Brother 62   Lung cancer Brother 16       smoked   Breast cancer Paternal Aunt    Prostate cancer Paternal Uncle 53 - 99    Breast cancer Paternal Grandmother 78 - 75   Social History   Socioeconomic History   Marital status: Legally Separated    Spouse name: Not on file   Number of children: 2   Years of education: Not on file   Highest education level: 12th grade  Occupational History   Occupation: [employer] service  Tobacco Use   Smoking status: Former    Current packs/day: 0.00    Types: Cigarettes    Quit date: 05/28/2023    Years since quitting: 1.2   Smokeless tobacco: Never  Vaping Use  Vaping status: Never Used  Substance and Sexual Activity   Alcohol use: Not Currently    Comment: Drinks approximately two times per week.   Drug use: Never   Sexual activity: Not on file  Other Topics Concern   Not on file  Social History Narrative   Not on file   Social Drivers of Health   Financial Resource Strain: Low Risk  (08/04/2023)   Overall Financial Resource Strain (CARDIA)    Difficulty of Paying Living Expenses: Not hard at all  Food Insecurity: No Food Insecurity (10/21/2023)   Hunger Vital Sign    Worried About Running Out of Food in the Last Year: Never true    Ran Out of Food in the Last Year: Never true  Transportation Needs: No Transportation Needs (10/21/2023)   PRAPARE - Administrator, Civil Service (Medical): No    Lack of Transportation (Non-Medical): No  Physical Activity: Inactive (08/04/2023)   Exercise Vital Sign    Days of Exercise per Week: 0 days    Minutes of Exercise per Session: 0 min  Stress: No Stress Concern Present (08/04/2023)   [employer] of Occupational Health - Occupational Stress Questionnaire    Feeling of Stress : Not at all  Social Connections: Unknown (08/04/2023)   Social Connection and Isolation Panel    Frequency of Communication with Friends and Family: More than three times a week    Frequency of Social Gatherings with Friends and Family: Three times a week    Attends Religious Services: Patient declined    Active Member  of Clubs or Organizations: No    Attends Banker Meetings: Never    Marital Status: Patient declined   CONSTITUTIONAL: Negative for chills, fatigue, fever, unintentional weight gain and unintentional weight loss.  E/N/T: Negative for ear pain, nasal congestion and sore throat.  CARDIOVASCULAR: Negative for chest pain, dizziness, palpitations and pedal edema.  RESPIRATORY: Negative for recent cough and dyspnea.  GASTROINTESTINAL: Negative for abdominal pain, acid reflux symptoms, constipation, diarrhea, nausea and vomiting.  MSK: Negative for arthralgias and myalgias.  INTEGUMENTARY: Negative for rash.  NEUROLOGICAL: Negative for dizziness and headaches.  PSYCHIATRIC: Negative for sleep disturbance and to question depression screen.  Negative for depression, negative for anhedonia.        Objective:  PHYSICAL EXAM:     09/12/2024   10:29 AM 05/12/2024    9:03 AM 01/11/2024    8:19 AM 10/21/2023    4:10 PM 08/04/2023   10:39 AM  Depression screen PHQ 2/9  Decreased Interest 0 0 2 0 0  Down, Depressed, Hopeless 0 0 2 0 0  PHQ - 2 Score 0 0 4 0 0  Altered sleeping  0 2  1  Tired, decreased energy  0 2  1  Change in appetite  0 3  1  Feeling bad or failure about yourself   0 0  0  Trouble concentrating  0 0  0  Moving slowly or fidgety/restless  0 0  0  Suicidal thoughts  0 0  0  PHQ-9 Score  0 11  3  Difficult doing work/chores  Not difficult at all Somewhat difficult  Not difficult at all  PHYSICAL EXAM:   VS: BP 122/84   Pulse 82   Temp 97.8 F (36.6 C) (Temporal)   Resp 18   Ht 5' 4 (1.626 m)   Wt 159 lb 12.8 oz (72.5 kg)   SpO2 96%   BMI  27.43 kg/m   GEN: Well nourished, well developed, in no acute distress   Cardiac: RRR; no murmurs, rubs, or gallops,no edema - Respiratory:  normal respiratory rate and pattern with no distress - normal breath sounds with no rales, rhonchi, wheezes or rubs  MS: no deformity or atrophy  Skin: warm and dry, no rash -  had moles on back/abdomen removed recently - healing well Neuro:  Alert and Oriented x 3, - CN II-Xii grossly intact Psych: euthymic mood, appropriate affect and demeanor    Lab Results  Component Value Date   WBC 10.5 05/12/2024   HGB 12.1 05/12/2024   HCT 37.5 05/12/2024   PLT 325 05/12/2024   GLUCOSE 102 (H) 05/12/2024   CHOL 148 05/12/2024   TRIG 150 (H) 05/12/2024   HDL 61 05/12/2024   LDLCALC 62 05/12/2024   ALT 22 05/12/2024   AST 23 05/12/2024   NA 140 05/12/2024   K 4.4 05/12/2024   CL 105 05/12/2024   CREATININE 0.78 05/12/2024   BUN 17 05/12/2024   CO2 20 05/12/2024   TSH 3.370 05/12/2024   HGBA1C 5.6 05/12/2024      Assessment & Plan:   Problem List Items Addressed This Visit       Cardiovascular and Mediastinum   Primary hypertension   Relevant Orders   CBC with Differential/Platelet   Comprehensive metabolic panel Continue meds Low salt diet     Respiratory        Digestive   GERD without esophagitis - Primary Continue prilosec 20mg  qd     Endocrine   Other specified hypothyroidism   Relevant Orders   TSH Continue synthroid 25mcg qd     Other   Mixed hyperlipidemia   Relevant Orders   Lipid panel Continue fish oil, zetia  and crestor  Low chol diet   Anxiety Continue  ativan prn   Vitamin D  deficiency   Relevant Orders   VITAMIN D  25 Hydroxy (Vit-D Deficiency, Fractures)  Malignant neoplasm of right breast  Follow up with oncology as directed  Prediabetes Watch diet Hgb A1c pending  Osteoporosis Follow up for Zometa infusion as directed  Need flu shot Fluzone Hi dose given  .  No orders of the defined types were placed in this encounter.   Orders Placed This Encounter  Procedures   Flu vaccine HIGH DOSE PF(Fluzone Trivalent)   CBC with Differential/Platelet   Comprehensive metabolic panel with GFR   TSH   Lipid panel   Hemoglobin A1c   VITAMIN D  25 Hydroxy (Vit-D Deficiency, Fractures)     Follow-up: Return  in about 4 months (around 01/13/2025) for chronic fasting follow-up.  An After Visit Summary was printed and given to the patient.  CAMIE JONELLE NICHOLAUS DEVONNA [facility] [phone]

## 2024-09-13 ENCOUNTER — Ambulatory Visit: Payer: Self-pay | Admitting: Physician Assistant

## 2024-09-13 LAB — COMPREHENSIVE METABOLIC PANEL WITH GFR
ALT: 34 IU/L — ABNORMAL HIGH (ref 0–32)
AST: 28 IU/L (ref 0–40)
Albumin: 4.3 g/dL (ref 3.9–4.9)
Alkaline Phosphatase: 92 IU/L (ref 49–135)
BUN/Creatinine Ratio: 16 (ref 12–28)
BUN: 14 mg/dL (ref 8–27)
Bilirubin Total: 0.2 mg/dL (ref 0.0–1.2)
CO2: 20 mmol/L (ref 20–29)
Calcium: 9.5 mg/dL (ref 8.7–10.3)
Chloride: 105 mmol/L (ref 96–106)
Creatinine, Ser: 0.85 mg/dL (ref 0.57–1.00)
Globulin, Total: 2.3 g/dL (ref 1.5–4.5)
Glucose: 110 mg/dL — ABNORMAL HIGH (ref 70–99)
Potassium: 4 mmol/L (ref 3.5–5.2)
Sodium: 141 mmol/L (ref 134–144)
Total Protein: 6.6 g/dL (ref 6.0–8.5)
eGFR: 76 mL/min/1.73 (ref >59)

## 2024-09-13 LAB — CBC WITH DIFFERENTIAL/PLATELET
Basophils Absolute: 0.1 x10E3/uL (ref 0.0–0.2)
Basos: 1 %
EOS (ABSOLUTE): 0.2 x10E3/uL (ref 0.0–0.4)
Eos: 3 %
Hematocrit: 39.1 % (ref 34.0–46.6)
Hemoglobin: 12.5 g/dL (ref 11.1–15.9)
Immature Grans (Abs): 0.1 x10E3/uL (ref 0.0–0.1)
Immature Granulocytes: 1 %
Lymphocytes Absolute: 2.7 x10E3/uL (ref 0.7–3.1)
Lymphs: 36 %
MCH: 29.3 pg (ref 26.6–33.0)
MCHC: 32 g/dL (ref 31.5–35.7)
MCV: 92 fL (ref 79–97)
Monocytes Absolute: 0.7 x10E3/uL (ref 0.1–0.9)
Monocytes: 9 %
Neutrophils Absolute: 3.8 x10E3/uL (ref 1.4–7.0)
Neutrophils: 50 %
Platelets: 284 x10E3/uL (ref 150–450)
RBC: 4.27 x10E6/uL (ref 3.77–5.28)
RDW: 13.1 % (ref 11.7–15.4)
WBC: 7.4 x10E3/uL (ref 3.4–10.8)

## 2024-09-13 LAB — HEMOGLOBIN A1C
Est. average glucose Bld gHb Est-mCnc: 126 mg/dL
Hgb A1c MFr Bld: 6 % — ABNORMAL HIGH (ref 4.8–5.6)

## 2024-09-13 LAB — LIPID PANEL
Chol/HDL Ratio: 2 ratio (ref 0.0–4.4)
Cholesterol, Total: 148 mg/dL (ref 100–199)
HDL: 74 mg/dL (ref >39)
LDL Chol Calc (NIH): 57 mg/dL (ref 0–99)
Triglycerides: 90 mg/dL (ref 0–149)
VLDL Cholesterol Cal: 17 mg/dL (ref 5–40)

## 2024-09-13 LAB — VITAMIN D 25 HYDROXY (VIT D DEFICIENCY, FRACTURES): Vit D, 25-Hydroxy: 35.3 ng/mL (ref 30.0–100.0)

## 2024-09-13 LAB — TSH: TSH: 2.18 u[IU]/mL (ref 0.450–4.500)

## 2024-09-18 DIAGNOSIS — Z419 Encounter for procedure for purposes other than remedying health state, unspecified: Secondary | ICD-10-CM | POA: Diagnosis not present

## 2024-09-26 ENCOUNTER — Ambulatory Visit
Admission: RE | Admit: 2024-09-26 | Discharge: 2024-09-26 | Disposition: A | Source: Ambulatory Visit | Attending: Hematology and Oncology

## 2024-09-26 ENCOUNTER — Other Ambulatory Visit: Payer: Self-pay | Admitting: Physician Assistant

## 2024-09-26 DIAGNOSIS — R928 Other abnormal and inconclusive findings on diagnostic imaging of breast: Secondary | ICD-10-CM | POA: Diagnosis not present

## 2024-09-26 DIAGNOSIS — Z170 Estrogen receptor positive status [ER+]: Secondary | ICD-10-CM

## 2024-09-26 DIAGNOSIS — F419 Anxiety disorder, unspecified: Secondary | ICD-10-CM

## 2024-09-30 ENCOUNTER — Other Ambulatory Visit: Payer: Self-pay | Admitting: *Deleted

## 2024-09-30 DIAGNOSIS — C50311 Malignant neoplasm of lower-inner quadrant of right female breast: Secondary | ICD-10-CM

## 2024-10-03 ENCOUNTER — Inpatient Hospital Stay: Attending: Adult Health

## 2024-10-03 ENCOUNTER — Inpatient Hospital Stay

## 2024-10-03 ENCOUNTER — Ambulatory Visit: Attending: Adult Health

## 2024-10-03 VITALS — BP 144/69 | HR 67 | Temp 97.8°F | Resp 16 | Ht 65.0 in | Wt 158.8 lb

## 2024-10-03 DIAGNOSIS — M810 Age-related osteoporosis without current pathological fracture: Secondary | ICD-10-CM | POA: Diagnosis not present

## 2024-10-03 DIAGNOSIS — Z170 Estrogen receptor positive status [ER+]: Secondary | ICD-10-CM | POA: Insufficient documentation

## 2024-10-03 DIAGNOSIS — Z79899 Other long term (current) drug therapy: Secondary | ICD-10-CM | POA: Diagnosis not present

## 2024-10-03 DIAGNOSIS — C50311 Malignant neoplasm of lower-inner quadrant of right female breast: Secondary | ICD-10-CM | POA: Diagnosis not present

## 2024-10-03 DIAGNOSIS — Z483 Aftercare following surgery for neoplasm: Secondary | ICD-10-CM | POA: Insufficient documentation

## 2024-10-03 LAB — CBC WITH DIFFERENTIAL (CANCER CENTER ONLY)
Abs Immature Granulocytes: 0.05 K/uL (ref 0.00–0.07)
Basophils Absolute: 0.1 K/uL (ref 0.0–0.1)
Basophils Relative: 1 %
Eosinophils Absolute: 0.2 K/uL (ref 0.0–0.5)
Eosinophils Relative: 2 %
HCT: 39 % (ref 36.0–46.0)
Hemoglobin: 13.3 g/dL (ref 12.0–15.0)
Immature Granulocytes: 1 %
Lymphocytes Relative: 31 %
Lymphs Abs: 2.5 K/uL (ref 0.7–4.0)
MCH: 29.8 pg (ref 26.0–34.0)
MCHC: 34.1 g/dL (ref 30.0–36.0)
MCV: 87.4 fL (ref 80.0–100.0)
Monocytes Absolute: 0.9 K/uL (ref 0.1–1.0)
Monocytes Relative: 11 %
Neutro Abs: 4.4 K/uL (ref 1.7–7.7)
Neutrophils Relative %: 54 %
Platelet Count: 270 K/uL (ref 150–400)
RBC: 4.46 MIL/uL (ref 3.87–5.11)
RDW: 13.5 % (ref 11.5–15.5)
WBC Count: 8.1 K/uL (ref 4.0–10.5)
nRBC: 0 % (ref 0.0–0.2)

## 2024-10-03 LAB — CMP (CANCER CENTER ONLY)
ALT: 26 U/L (ref 0–44)
AST: 24 U/L (ref 15–41)
Albumin: 4.4 g/dL (ref 3.5–5.0)
Alkaline Phosphatase: 73 U/L (ref 38–126)
Anion gap: 7 (ref 5–15)
BUN: 13 mg/dL (ref 8–23)
CO2: 26 mmol/L (ref 22–32)
Calcium: 9.9 mg/dL (ref 8.9–10.3)
Chloride: 107 mmol/L (ref 98–111)
Creatinine: 0.86 mg/dL (ref 0.44–1.00)
GFR, Estimated: >60 mL/min (ref >60)
Glucose, Bld: 105 mg/dL — ABNORMAL HIGH (ref 70–99)
Potassium: 3.8 mmol/L (ref 3.5–5.1)
Sodium: 140 mmol/L (ref 135–145)
Total Bilirubin: 0.4 mg/dL (ref 0.0–1.2)
Total Protein: 7.2 g/dL (ref 6.5–8.1)

## 2024-10-03 MED ORDER — ZOLEDRONIC ACID 4 MG/100ML IV SOLN
4.0000 mg | Freq: Once | INTRAVENOUS | Status: AC
  Administered 2024-10-03: 4 mg via INTRAVENOUS
  Filled 2024-10-03: qty 100

## 2024-10-03 MED ORDER — SODIUM CHLORIDE 0.9 % IV SOLN: INTRAVENOUS | Status: DC

## 2024-10-03 NOTE — Therapy (Signed)
 OUTPATIENT PHYSICAL THERAPY SOZO SCREENING NOTE   Patient Name: Lori Wise MRN: [id number] DOB:Dec 21, 1960, 63 y.o., female Today's Date: 10/03/2024  PCP: Nicholaus Credit, [name] REFERRING PROVIDER: Crawford Jacobsen Cornett*   PT End of Session - 10/03/24 1355     Visit Number 4   unchanged due to screen only   Number of Visits 12    Date for Recertification  06/28/24    Authorization Type [facility] Medicaid    PT Start Time 1400    Activity Tolerance Patient tolerated treatment well    Behavior During Therapy [facility] for tasks assessed/performed          Past Medical History:  Diagnosis Date   Acute laryngopharyngitis 04/19/2020   Anxiety 04/19/2020   Atypical nevus 04/19/2020   Bronchitis 04/30/2020   Chronic obstructive pulmonary disease (HCC) 10/22/2020   Chronic obstructive pulmonary disease with (acute) lower respiratory infection (HCC)    Encounter for immunization 04/19/2020   Essential (primary) hypertension    Gastroesophageal reflux disease 10/22/2020   Hormone replacement therapy (HRT) 10/22/2020   Malignant neoplasm of lower-inner quadrant of right female breast (HCC)    Migraine without aura, not intractable, without status migrainosus    Mixed hyperlipidemia    Other chest pain 03/21/2021   Other specified hypothyroidism 04/19/2020   Primary hypertension 03/21/2021   Right leg pain 07/23/2020   Past Surgical History:  Procedure Laterality Date   ABDOMINAL HYSTERECTOMY     BREAST BIOPSY Left 10/15/2023   MM LT BREAST BX W LOC DEV 1ST LESION IMAGE BX SPEC STEREO GUIDE 10/15/2023 [facility]   BREAST BIOPSY Right 10/15/2023   MM RT BREAST BX W LOC DEV 1ST LESION IMAGE BX SPEC STEREO GUIDE 10/15/2023 [facility]   BREAST BIOPSY  11/10/2023   MM RT RADIOACTIVE SEED LOC MAMMO GUIDE 11/10/2023 [facility]   BREAST LUMPECTOMY WITH RADIOACTIVE SEED AND SENTINEL LYMPH NODE BIOPSY Right 11/11/2023   Procedure: RIGHT BREAST SEED LOCALIZED  LUMPECTOMY AND  SENTINEL NODE BIOPSY;  Surgeon: Vernetta Berg, MD;  Location: [facility];  Service: General;  Laterality: Right;   CARDIAC CATHETERIZATION  2000   normal   CHOLECYSTECTOMY     NASAL HEMORRHAGE CONTROL     OTHER SURGICAL HISTORY     Ear surgeries x5   Patient Active Problem List   Diagnosis Date Noted   Osteoporosis 08/09/2024   Hyperglycemia 01/11/2024   Genetic testing 11/04/2023   Malignant neoplasm of lower-inner quadrant of right breast of female, estrogen receptor positive (HCC) 10/19/2023   Epistaxis 05/06/2023   Acute cystitis with hematuria 04/16/2023   Neck pain 04/16/2023   Tobacco abuse 01/28/2023   Allergic rhinitis due to allergen 04/15/2022   Need for pneumococcal 20-valent conjugate vaccination 04/15/2022   Vitamin D  deficiency 04/15/2022   Smoking 08/30/2021   Chronic obstructive pulmonary disease with (acute) lower respiratory infection (HCC)    Essential (primary) hypertension    Migraine without aura, not intractable, without status migrainosus    Sunburn of second degree    Atypical chest pain 03/21/2021   Primary hypertension 03/21/2021   Chronic obstructive pulmonary disease (HCC) 10/22/2020   Hormone replacement therapy (HRT) 10/22/2020   GERD without esophagitis 10/22/2020   Right leg pain 07/23/2020   Bronchitis 04/30/2020   Mixed hyperlipidemia 04/19/2020   Other specified hypothyroidism 04/19/2020   Anxiety 04/19/2020   Acute laryngopharyngitis 04/19/2020   Encounter for immunization 04/19/2020   Atypical nevus 04/19/2020    REFERRING DIAG: right breast  cancer at risk for lymphedema  THERAPY DIAG:  Aftercare following surgery for neoplasm  PERTINENT HISTORY:  Patient was diagnosed on 09/16/2023 with 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 It measures 1.1 cm and is located in the lower inner quadrant. It is ER/PR positive and HER2 negative with a Ki67 of 3%. Rt lumpectomy and SLNB on 11/11/23 with 8 negative  nodes removed.   PRECAUTIONS: right UE Lymphedema risk,   SUBJECTIVE: The only issue I have is when my shoulder will catch  PAIN:  Are you having pain? No  SOZO SCREENING: Patient was assessed today using the SOZO machine to determine the lymphedema index score. This was compared to her baseline score. It was determined that she is within the recommended range when compared to her baseline and no further action is needed at this time. She will continue SOZO screenings. These are done every 3 months for 2 years post operatively followed by every 6 months for 2 years, and then annually.   L-DEX FLOWSHEETS - 10/03/24 1400       L-DEX LYMPHEDEMA SCREENING   Measurement Type Unilateral    L-DEX MEASUREMENT EXTREMITY Upper Extremity    POSITION  Standing    DOMINANT SIDE Right    At Risk Side Right    BASELINE SCORE (UNILATERAL) 0.6    L-DEX SCORE (UNILATERAL) -5.7    VALUE CHANGE (UNILAT) -6.3             Grayce JINNY Sheldon, PT 10/03/2024, 1:56 PM

## 2024-10-03 NOTE — Patient Instructions (Signed)

## 2024-10-03 NOTE — Progress Notes (Signed)
 Okay to proceed with Zometa today without dental clearance per Dr. Gudena.  Harlene Nasuti, PharmD Oncology Infusion Pharmacist 10/03/2024 12:23 PM

## 2024-10-18 ENCOUNTER — Other Ambulatory Visit: Payer: Self-pay | Admitting: Physician Assistant

## 2024-10-18 DIAGNOSIS — F419 Anxiety disorder, unspecified: Secondary | ICD-10-CM

## 2024-10-19 DIAGNOSIS — Z419 Encounter for procedure for purposes other than remedying health state, unspecified: Secondary | ICD-10-CM | POA: Diagnosis not present

## 2024-10-31 ENCOUNTER — Telehealth: Payer: Self-pay

## 2024-10-31 NOTE — Telephone Encounter (Signed)
 S/w patient regarding ongoing issues with fatigue, weakness, and poor appetite. Patient notes that these issues have been ongoing since Zometa  infusion on 10/27. Patient also notes having flu-like illness for two weeks around this time. Patient states that she is taking all medications and vitamins as prescribed. She has not started on any new medications within this time period.  Patient denies any chest pain, shortness of breath, muscle cramps, muscle spasms, or seizure-like activity at this time.  Informed patient that her symptoms are unlikely related to the zometa  infusion, especially for as long as they have been going on.  Patient saw PCP today for ongoing issues and was encouraged to push food and fluids. She was told that she would have labs drawn with PCP in 2-weeks on 12/8. Encouraged patient to keep appointment with PCP. Reviewed interventions to help with nausea and fatigue. Patient declined prescription for at-home nausea medication at this time. Patient verbalized an understanding of the information.  Strict ED precautions reviewed. Dr. Odean aware of the situation.

## 2024-11-03 ENCOUNTER — Other Ambulatory Visit: Payer: Self-pay | Admitting: Family Medicine

## 2024-11-03 ENCOUNTER — Other Ambulatory Visit: Payer: Self-pay | Admitting: Physician Assistant

## 2024-11-03 DIAGNOSIS — E782 Mixed hyperlipidemia: Secondary | ICD-10-CM

## 2024-11-08 ENCOUNTER — Other Ambulatory Visit: Payer: Self-pay | Admitting: Medical Genetics

## 2024-11-08 DIAGNOSIS — Z006 Encounter for examination for normal comparison and control in clinical research program: Secondary | ICD-10-CM

## 2024-11-14 ENCOUNTER — Ambulatory Visit: Admitting: Physician Assistant

## 2024-11-14 ENCOUNTER — Encounter: Payer: Self-pay | Admitting: Physician Assistant

## 2024-11-14 VITALS — BP 110/68 | HR 74 | Resp 16 | Ht 65.0 in | Wt 148.4 lb

## 2024-11-14 DIAGNOSIS — E559 Vitamin D deficiency, unspecified: Secondary | ICD-10-CM

## 2024-11-14 DIAGNOSIS — N3001 Acute cystitis with hematuria: Secondary | ICD-10-CM

## 2024-11-14 DIAGNOSIS — R5383 Other fatigue: Secondary | ICD-10-CM | POA: Diagnosis not present

## 2024-11-14 LAB — POCT URINALYSIS DIP (CLINITEK)
Bilirubin, UA: NEGATIVE
Glucose, UA: NEGATIVE mg/dL
Ketones, POC UA: NEGATIVE mg/dL
Nitrite, UA: NEGATIVE
Spec Grav, UA: 1.015 (ref 1.010–1.025)
Urobilinogen, UA: 0.2 U/dL
pH, UA: 6 (ref 5.0–8.0)

## 2024-11-14 MED ORDER — DOXYCYCLINE HYCLATE 100 MG PO TABS: 100.0000 mg | ORAL_TABLET | Freq: Two times a day (BID) | ORAL | 0 refills | Status: DC

## 2024-11-14 NOTE — Progress Notes (Signed)
 Acute Office Visit  Subjective:    Patient ID: Lori Wise, female    DOB: 26-May-1961, 63 y.o.   MRN: [id number]  Chief Complaint  Patient presents with   Fatigue    After infusion on 10/03/24    HPI: Patient is in today for complaints of moderate fatigue. She states it began after receiving chemotherapy infusion for breast cancer treatment on 10/27. Says for the first several days after the infusion she slept and could not even get up out of her bed.  She has steadily improved her stamina but still more fatigued than usual.  She spoke with her oncology office and they advised to get labwork / evaluation done because the treatment she had does not usually cause those symptoms.   She states however about a week ago she also began having urinary urgency and dysuria Pt denies chest pain, abdominal pain or back pain Denies fever   Current Outpatient Medications:    albuterol  (VENTOLIN  HFA) 108 (90 Base) MCG/ACT inhaler, Inhale 2 puffs into the lungs every 6 (six) hours as needed for wheezing or shortness of breath., Disp: 8 g, Rfl: 2   ALPRAZolam  (XANAX ) 0.5 MG tablet, TAKE 1 TABLET BY MOUTH 3 TIMES DAILY, Disp: 90 tablet, Rfl: 0   Ascorbic Acid (VITAMIN C PO), Take 1 tablet by mouth daily. Unknown strength, Disp: , Rfl:    aspirin EC 81 MG tablet, Take 81 mg by mouth daily. Swallow whole., Disp: , Rfl:    atenolol  (TENORMIN ) 25 MG tablet, TAKE ONE TABLET BY MOUTH TWICE DAILY, Disp: 60 tablet, Rfl: 5   cetirizine  (ZYRTEC ) 10 MG tablet, TAKE ONE TABLET BY MOUTH DAILY, Disp: 30 tablet, Rfl: 11   Cholecalciferol (VITAMIN D3) 25 MCG (1000 UT) CAPS, Take 1 Units by mouth daily., Disp: , Rfl:    Cyanocobalamin (VITAMIN B-12 PO), Take 1 tablet by mouth daily. Unknown strenght, Disp: , Rfl:    doxycycline  (VIBRA -TABS) 100 MG tablet, Take 1 tablet (100 mg total) by mouth 2 (two) times daily., Disp: 20 tablet, Rfl: 0   Emollient (ROC RETINOL CORREXION EX), Apply topically., Disp: , Rfl:     ezetimibe  (ZETIA ) 10 MG tablet, TAKE ONE TABLET BY MOUTH EVERY DAY, Disp: 30 tablet, Rfl: 5   Folic Acid-Vit B6-Vit B12 (FOLBEE) 2.5-25-1 MG TABS tablet, Take 1 tablet by mouth daily., Disp: , Rfl:    ibuprofen  (ADVIL ) 800 MG tablet, Take 1 tablet (800 mg total) by mouth every 8 (eight) hours as needed for mild pain (pain score 1-3) or moderate pain (pain score 4-6)., Disp: 90 tablet, Rfl: 1   ipratropium-albuterol  (DUONEB) 0.5-2.5 (3) MG/3ML SOLN, Take 3 mLs by nebulization every 4 (four) hours as needed., Disp: 360 mL, Rfl: 3   levothyroxine (SYNTHROID) 25 MCG tablet, TAKE ONE TABLET BY MOUTH EVERY DAY, Disp: 90 tablet, Rfl: 1   Omega-3 1000 MG CAPS, Take 1 capsule by mouth daily., Disp: , Rfl:    omeprazole  (PRILOSEC) 20 MG capsule, Take 1 capsule (20 mg total) by mouth 2 (two) times daily before a meal., Disp: 180 capsule, Rfl: 1   rosuvastatin  (CRESTOR ) 5 MG tablet, Take 1 tablet (5 mg total) by mouth daily., Disp: 90 tablet, Rfl: 1   tretinoin (RETIN-A) 0.05 % cream, Apply topically at bedtime., Disp: , Rfl:    VITAMIN E PO, Take 1 tablet by mouth daily at 6 (six) AM. Unknown strength, Disp: , Rfl:    anastrozole  (ARIMIDEX ) 1 MG tablet, Take 1 tablet (  1 mg total) by mouth daily., Disp: 90 tablet, Rfl: 3  Current Facility-Administered Medications:    triamcinolone  acetonide (KENALOG -40) injection 60 mg, 60 mg, Intramuscular, Once,   Allergies  Allergen Reactions   Codeine Hives, Itching, Other (See Comments), Rash and Nausea And Vomiting   Macrobid  [Nitrofurantoin ] Other (See Comments)    GI intolerance   Ativan [Lorazepam] Other (See Comments)    agitation    ROS CONSTITUTIONAL: see HPI  E/N/T: Negative for ear pain, nasal congestion and sore throat.  CARDIOVASCULAR: Negative for chest pain, dizziness, palpitations and pedal edema.  RESPIRATORY: Negative for recent cough and dyspnea.  GASTROINTESTINAL: Negative for abdominal pain, acid reflux symptoms, constipation, diarrhea,  nausea and vomiting.  GU - see HPI INTEGUMENTARY: Negative for rash.  NEUROLOGICAL: Negative for dizziness and headaches.       Objective:    PHYSICAL EXAM:   BP 110/68   Pulse 74   Resp 16   Ht 5' 5 (1.651 m)   Wt 148 lb 6.4 oz (67.3 kg)   SpO2 97%   BMI 24.70 kg/m    GEN: Well nourished, well developed, in no acute distress  HEENT: normal external ears and nose - normal external auditory canals and TMS - - Lips, Teeth and Gums - normal  Oropharynx - normal mucosa, palate, and posterior pharynx Cardiac: RRR; no murmurs, Respiratory:  normal respiratory rate and pattern with no distress - normal breath sounds with no rales, rhonchi, wheezes or rubs Skin: warm and dry, no rash  Neuro:  Alert and Oriented x 3, - CN II-Xii grossly intact Psych: euthymic mood, appropriate affect and demeanor     Office Visit on 11/14/2024  Component Date Value Ref Range Status   Color, UA 11/14/2024 yellow  yellow Final   Clarity, UA 11/14/2024 clear  clear Final   Glucose, UA 11/14/2024 negative  negative mg/dL Final   Bilirubin, UA 87/91/7974 negative  negative Final   Ketones, POC UA 11/14/2024 negative  negative mg/dL Final   Spec Grav, UA 87/91/7974 1.015  1.010 - 1.025 Final   Blood, UA 11/14/2024 moderate (A)  negative Final   pH, UA 11/14/2024 6.0  5.0 - 8.0 Final   POC PROTEIN,UA 11/14/2024 trace  negative, trace Final   Urobilinogen, UA 11/14/2024 0.2  0.2 or 1.0 E.U./dL Final   Nitrite, UA 87/91/7974 Negative  Negative Final   Leukocytes, UA 11/14/2024 Moderate (2+) (A)  Negative Final    Assessment & Plan:    Acute hemorrhagic cystitis -     Urine Culture -     POCT URINALYSIS DIP (CLINITEK) -     Doxycycline  Hyclate; Take 1 tablet (100 mg total) by mouth 2 (two) times daily.  Dispense: 20 tablet; Refill: 0  Other fatigue -     Fe+CBC/D/Plt+TIBC+Fer+Retic -     Comprehensive metabolic panel with GFR -     TSH -     B12 and Folate Panel -     VITAMIN D  25 Hydroxy  (Vit-D Deficiency, Fractures)  Vitamin D  deficiency -     VITAMIN D  25 Hydroxy (Vit-D Deficiency, Fractures)     Follow-up: Return in about 3 weeks (around 12/05/2024) for follow-up.  An After Visit Summary was printed and given to the patient.  CAMIE JONELLE NICHOLAUS DEVONNA [facility] [phone]

## 2024-11-15 ENCOUNTER — Ambulatory Visit: Payer: Self-pay | Admitting: Physician Assistant

## 2024-11-15 ENCOUNTER — Other Ambulatory Visit: Payer: Self-pay | Admitting: Physician Assistant

## 2024-11-15 DIAGNOSIS — F419 Anxiety disorder, unspecified: Secondary | ICD-10-CM

## 2024-11-15 LAB — FE+CBC/D/PLT+TIBC+FER+RETIC
Basophils Absolute: 0.1 x10E3/uL (ref 0.0–0.2)
Basos: 1 %
EOS (ABSOLUTE): 0.4 x10E3/uL (ref 0.0–0.4)
Eos: 5 %
Ferritin: 267 ng/mL — ABNORMAL HIGH (ref 15–150)
Hematocrit: 37.5 % (ref 34.0–46.6)
Hemoglobin: 11.8 g/dL (ref 11.1–15.9)
Immature Grans (Abs): 0 x10E3/uL (ref 0.0–0.1)
Immature Granulocytes: 0 %
Iron Saturation: 28 % (ref 15–55)
Iron: 69 ug/dL (ref 27–139)
Lymphocytes Absolute: 1.9 x10E3/uL (ref 0.7–3.1)
Lymphs: 28 %
MCH: 29 pg (ref 26.6–33.0)
MCHC: 31.5 g/dL (ref 31.5–35.7)
MCV: 92 fL (ref 79–97)
Monocytes Absolute: 0.7 x10E3/uL (ref 0.1–0.9)
Monocytes: 10 %
Neutrophils Absolute: 4 x10E3/uL (ref 1.4–7.0)
Neutrophils: 56 %
Platelets: 335 x10E3/uL (ref 150–450)
RBC: 4.07 x10E6/uL (ref 3.77–5.28)
RDW: 13.7 % (ref 11.7–15.4)
Retic Ct Pct: 1.5 % (ref 0.6–2.6)
Total Iron Binding Capacity: 248 ug/dL — ABNORMAL LOW (ref 250–450)
UIBC: 179 ug/dL (ref 118–369)
WBC: 7 x10E3/uL (ref 3.4–10.8)

## 2024-11-15 LAB — COMPREHENSIVE METABOLIC PANEL WITH GFR
ALT: 17 IU/L (ref 0–32)
AST: 24 IU/L (ref 0–40)
Albumin: 4.1 g/dL (ref 3.9–4.9)
Alkaline Phosphatase: 84 IU/L (ref 49–135)
BUN/Creatinine Ratio: 8 — ABNORMAL LOW (ref 12–28)
BUN: 7 mg/dL — ABNORMAL LOW (ref 8–27)
Bilirubin Total: 0.3 mg/dL (ref 0.0–1.2)
CO2: 21 mmol/L (ref 20–29)
Calcium: 9.4 mg/dL (ref 8.7–10.3)
Chloride: 108 mmol/L — ABNORMAL HIGH (ref 96–106)
Creatinine, Ser: 0.83 mg/dL (ref 0.57–1.00)
Globulin, Total: 2.1 g/dL (ref 1.5–4.5)
Glucose: 117 mg/dL — ABNORMAL HIGH (ref 70–99)
Potassium: 3.9 mmol/L (ref 3.5–5.2)
Sodium: 144 mmol/L (ref 134–144)
Total Protein: 6.2 g/dL (ref 6.0–8.5)
eGFR: 79 mL/min/1.73 (ref >59)

## 2024-11-15 LAB — B12 AND FOLATE PANEL
Folate: 10.6 ng/mL (ref >3.0)
Vitamin B-12: >2000 pg/mL — ABNORMAL HIGH (ref 232–1245)

## 2024-11-15 LAB — VITAMIN D 25 HYDROXY (VIT D DEFICIENCY, FRACTURES): Vit D, 25-Hydroxy: 47 ng/mL (ref 30.0–100.0)

## 2024-11-15 LAB — TSH: TSH: 2.12 u[IU]/mL (ref 0.450–4.500)

## 2024-11-16 LAB — URINE CULTURE

## 2024-11-16 LAB — GENECONNECT MOLECULAR SCREEN: Genetic Analysis Overall Interpretation: NEGATIVE

## 2024-12-01 ENCOUNTER — Other Ambulatory Visit: Payer: Self-pay | Admitting: Physician Assistant

## 2024-12-01 DIAGNOSIS — I10 Essential (primary) hypertension: Secondary | ICD-10-CM

## 2024-12-07 ENCOUNTER — Ambulatory Visit (INDEPENDENT_AMBULATORY_CARE_PROVIDER_SITE_OTHER): Admitting: Physician Assistant

## 2024-12-07 ENCOUNTER — Encounter: Payer: Self-pay | Admitting: Physician Assistant

## 2024-12-07 VITALS — BP 120/60 | HR 89 | Temp 97.4°F | Resp 18 | Ht 65.0 in | Wt 146.0 lb

## 2024-12-07 DIAGNOSIS — N3001 Acute cystitis with hematuria: Secondary | ICD-10-CM

## 2024-12-07 DIAGNOSIS — R899 Unspecified abnormal finding in specimens from other organs, systems and tissues: Secondary | ICD-10-CM

## 2024-12-07 DIAGNOSIS — J069 Acute upper respiratory infection, unspecified: Secondary | ICD-10-CM

## 2024-12-07 LAB — POCT URINALYSIS DIP (CLINITEK)
Glucose, UA: NEGATIVE mg/dL
Ketones, POC UA: NEGATIVE mg/dL
Leukocytes, UA: NEGATIVE
Nitrite, UA: NEGATIVE
Spec Grav, UA: 1.025
Urobilinogen, UA: 0.2 U/dL
pH, UA: 5.5

## 2024-12-07 LAB — POCT RESPIRATORY SYNCYTIAL VIRUS: RSV Rapid Ag: NEGATIVE

## 2024-12-07 LAB — POC COVID19 BINAXNOW: SARS Coronavirus 2 Ag: NEGATIVE

## 2024-12-07 MED ORDER — CIPROFLOXACIN HCL 500 MG PO TABS: 500.0000 mg | ORAL_TABLET | Freq: Two times a day (BID) | ORAL | 0 refills | Status: AC

## 2024-12-07 MED ORDER — FLUCONAZOLE 150 MG PO TABS: 150.0000 mg | ORAL_TABLET | Freq: Once | ORAL | 0 refills | Status: AC

## 2024-12-07 NOTE — Progress Notes (Signed)
 "  Subjective:  Patient ID: Lori Wise, female    DOB: February 24, 1961  Age: 63 y.o. MRN: [id number]  Chief Complaint  Patient presents with   Recheck urine Cold symptoms    HPI Pt in today to recheck ua - is currently not having symptoms but a few weeks ago treated for uti with positive urine culture for Klebsiella  Pt complains of cough, congestion and mild malaise for the past 3 days.  Has had productive cough at times Denies chest pain or dyspnea - Family with similar symptoms  Pt due to repeat labwork - at last visit advised to stop Vit B12 and will recheck today Also iron was low with elevated ferritin - will repeat today (pt does not see her hematologist/oncologist until October)     10/03/2024   11:57 AM 09/12/2024   10:29 AM 05/12/2024    9:03 AM 01/11/2024    8:19 AM 10/21/2023    4:10 PM  Depression screen PHQ 2/9  Decreased Interest 0 0 0 2 0  Down, Depressed, Hopeless 0 0 0 2 0  PHQ - 2 Score 0 0 0 4 0  Altered sleeping   0 2   Tired, decreased energy   0 2   Change in appetite   0 3   Feeling bad or failure about yourself    0 0   Trouble concentrating   0 0   Moving slowly or fidgety/restless   0 0   Suicidal thoughts   0 0   PHQ-9 Score   0  11    Difficult doing work/chores   Not difficult at all Somewhat difficult      Data saved with a previous flowsheet row definition        04/15/2023    9:47 AM 08/04/2023   10:39 AM 01/11/2024    8:19 AM 05/12/2024    9:03 AM 09/12/2024   10:29 AM  Fall Risk  Falls in the past year? 0 0 0 0 0  Was there an injury with Fall? 0  0  0  0  0   Fall Risk Category Calculator 0  0 0 0  Patient at Risk for Falls Due to No Fall Risks No Fall Risks No Fall Risks No Fall Risks No Fall Risks  Fall risk Follow up Falls evaluation completed Falls evaluation completed Falls evaluation completed Falls evaluation completed Falls evaluation completed     Data saved with a previous flowsheet row definition     ROS CONSTITUTIONAL: Negative  for chills, fatigue, fever, E/N/T: see HPI CARDIOVASCULAR: Negative for chest pain, dizziness, palpitations and pedal edema.  RESPIRATORY: see HPI GASTROINTESTINAL: Negative for abdominal pain, acid reflux symptoms, constipation, diarrhea, nausea and vomiting.  GU - negative  MSK: Negative for arthralgias and myalgias.  INTEGUMENTARY: Negative for rash.    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)    Migraine without aura, not intractable, without status migrainosus    Mixed hyperlipidemia    Other chest pain 03/21/2021   Other  specified hypothyroidism 04/19/2020   Primary hypertension 03/21/2021   Right leg pain 07/23/2020   Objective:  PHYSICAL EXAM:   BP 120/60   Pulse 89   Temp (!) 97.4 F (36.3 C)   Resp 18   Ht 5' 5 (1.651 m)   Wt 146 lb (66.2 kg)   SpO2 96%   BMI 24.30 kg/m    GEN: Well nourished, well developed, in no acute distress  HEENT: normal external ears and nose - normal external auditory canals and TMS  - Lips, Teeth and Gums - normal  Oropharynx - erythema/pnd Cardiac: RRR; no murmurs, rubs,  Respiratory:  normal respiratory rate and pattern with no distress - normal breath sounds with no rales, rhonchi, wheezes or rubs Psych: euthymic mood, appropriate affect and demeanor Office Visit on 12/07/2024  Component Date Value Ref Range Status   RSV Rapid Ag 12/07/2024 Negative   Final   SARS Coronavirus 2 Ag 12/07/2024 Negative  Negative  Final   Color, UA 12/07/2024 straw (A)  yellow Final   Clarity, UA 12/07/2024 cloudy (A)  clear Final   Glucose, UA 12/07/2024 negative  negative mg/dL Final   Bilirubin, UA 87/68/7974 small (A)  negative Final   Ketones, POC UA 12/07/2024 negative  negative mg/dL Final   Spec Grav, UA 87/68/7974 1.025  1.010 - 1.025 Final   Blood, UA 12/07/2024 moderate (A)  negative Final   pH, UA 12/07/2024 5.5  5.0 - 8.0 Final   POC PROTEIN,UA 12/07/2024 trace  negative, trace Final   Urobilinogen, UA 12/07/2024 0.2  0.2 or 1.0 E.U./dL Final   Nitrite, UA 87/68/7974 Negative  Negative Final   Leukocytes, UA 12/07/2024 Negative  Negative Final    Assessment & Plan:    Acute upper respiratory infection -     POCT respiratory syncytial virus -     POC COVID-19 BinaxNow -     Ciprofloxacin HCl; Take 1 tablet (500 mg total) by mouth 2 (two) times daily for 10 days.  Dispense: 20 tablet; Refill: 0  Abnormal laboratory test -     Fe+CBC/D/Plt+TIBC+Fer+Retic -     B12 and Folate Panel  Acute cystitis with hematuria -     POCT URINALYSIS DIP (CLINITEK) -     Urine Culture -     Ciprofloxacin HCl; Take 1 tablet (500 mg total) by mouth 2 (two) times daily for 10 days.  Dispense: 20 tablet; Refill: 0  Other orders -     Fluconazole ; Take 1 tablet (150 mg total) by mouth once for 1 dose.  Dispense: 1 tablet; Refill: 0     Follow-up: No follow-ups on file.  An After Visit Summary was printed and given to the patient.  SARA R Kayveon Lennartz, [name] [facility] [phone]     [1]  Current Outpatient Medications:    ALPRAZolam  (XANAX ) 0.5 MG tablet, TAKE 1 TABLET BY MOUTH 3 TIMES DAILY, Disp: 90 tablet, Rfl: 0   anastrozole  (ARIMIDEX ) 1 MG tablet, Take 1 tablet (1 mg total) by mouth daily., Disp: 90 tablet, Rfl: 3   Ascorbic Acid (VITAMIN C PO), Take 1 tablet by mouth daily. Unknown strength, Disp: , Rfl:    aspirin EC 81 MG tablet, Take 81 mg by mouth daily. Swallow whole., Disp: , Rfl:     atenolol  (TENORMIN ) 25 MG tablet, TAKE ONE TABLET BY MOUTH TWICE DAILY, Disp: 60 tablet, Rfl: 0   cetirizine  (ZYRTEC ) 10 MG tablet, TAKE ONE TABLET BY MOUTH DAILY, Disp: 30  tablet, Rfl: 11   Cholecalciferol (VITAMIN D3) 25 MCG (1000 UT) CAPS, Take 1 Units by mouth daily., Disp: , Rfl:    ciprofloxacin (CIPRO) 500 MG tablet, Take 1 tablet (500 mg total) by mouth 2 (two) times daily for 10 days., Disp: 20 tablet, Rfl: 0   Cyanocobalamin (VITAMIN B-12 PO), Take 1 tablet by mouth daily. Unknown strenght, Disp: , Rfl:    Emollient (ROC RETINOL CORREXION EX), Apply topically., Disp: , Rfl:    ezetimibe  (ZETIA ) 10 MG tablet, TAKE ONE TABLET BY MOUTH EVERY DAY, Disp: 30 tablet, Rfl: 5   fluconazole  (DIFLUCAN ) 150 MG tablet, Take 1 tablet (150 mg total) by mouth once for 1 dose., Disp: 1 tablet, Rfl: 0   Folic Acid-Vit B6-Vit B12 (FOLBEE) 2.5-25-1 MG TABS tablet, Take 1 tablet by mouth daily., Disp: , Rfl:    ibuprofen  (ADVIL ) 800 MG tablet, Take 1 tablet (800 mg total) by mouth every 8 (eight) hours as needed for mild pain (pain score 1-3) or moderate pain (pain score 4-6)., Disp: 90 tablet, Rfl: 1   ipratropium-albuterol  (DUONEB) 0.5-2.5 (3) MG/3ML SOLN, Take 3 mLs by nebulization every 4 (four) hours as needed., Disp: 360 mL, Rfl: 3   levothyroxine (SYNTHROID) 25 MCG tablet, TAKE ONE TABLET BY MOUTH EVERY DAY, Disp: 90 tablet, Rfl: 1   Omega-3 1000 MG CAPS, Take 1 capsule by mouth daily., Disp: , Rfl:    omeprazole  (PRILOSEC) 20 MG capsule, Take 1 capsule (20 mg total) by mouth 2 (two) times daily before a meal., Disp: 180 capsule, Rfl: 1   rosuvastatin  (CRESTOR ) 5 MG tablet, Take 1 tablet (5 mg total) by mouth daily., Disp: 90 tablet, Rfl: 1   tretinoin (RETIN-A) 0.05 % cream, Apply topically at bedtime., Disp: , Rfl:    VITAMIN E PO, Take 1 tablet by mouth daily at 6 (six) AM. Unknown strength, Disp: , Rfl:   Current Facility-Administered Medications:    triamcinolone  acetonide (KENALOG -40) injection  60 mg, 60 mg, Intramuscular, Once,   "

## 2024-12-08 ENCOUNTER — Ambulatory Visit: Payer: Self-pay | Admitting: Physician Assistant

## 2024-12-08 ENCOUNTER — Other Ambulatory Visit: Payer: Self-pay | Admitting: Physician Assistant

## 2024-12-08 DIAGNOSIS — R7989 Other specified abnormal findings of blood chemistry: Secondary | ICD-10-CM

## 2024-12-08 LAB — FE+CBC/D/PLT+TIBC+FER+RETIC
Basophils Absolute: 0.1 x10E3/uL (ref 0.0–0.2)
Basos: 1 %
EOS (ABSOLUTE): 0.4 x10E3/uL (ref 0.0–0.4)
Eos: 3 %
Ferritin: 277 ng/mL — ABNORMAL HIGH (ref 15–150)
Hematocrit: 37.1 % (ref 34.0–46.6)
Hemoglobin: 12.2 g/dL (ref 11.1–15.9)
Immature Grans (Abs): 0 x10E3/uL (ref 0.0–0.1)
Immature Granulocytes: 0 %
Iron Saturation: 10 % — ABNORMAL LOW (ref 15–55)
Iron: 24 ug/dL — ABNORMAL LOW (ref 27–139)
Lymphocytes Absolute: 2.2 x10E3/uL (ref 0.7–3.1)
Lymphs: 14 %
MCH: 30.1 pg (ref 26.6–33.0)
MCHC: 32.9 g/dL (ref 31.5–35.7)
MCV: 92 fL (ref 79–97)
Monocytes Absolute: 1.6 x10E3/uL — ABNORMAL HIGH (ref 0.1–0.9)
Monocytes: 10 %
Neutrophils Absolute: 11 x10E3/uL — ABNORMAL HIGH (ref 1.4–7.0)
Neutrophils: 72 %
Platelets: 324 x10E3/uL (ref 150–450)
RBC: 4.05 x10E6/uL (ref 3.77–5.28)
RDW: 13.4 % (ref 11.7–15.4)
Retic Ct Pct: 1.8 % (ref 0.6–2.6)
Total Iron Binding Capacity: 239 ug/dL — ABNORMAL LOW (ref 250–450)
UIBC: 215 ug/dL (ref 118–369)
WBC: 15.3 x10E3/uL — ABNORMAL HIGH (ref 3.4–10.8)

## 2024-12-08 LAB — B12 AND FOLATE PANEL
Folate: 10.4 ng/mL
Vitamin B-12: 1008 pg/mL (ref 232–1245)

## 2024-12-09 LAB — URINE CULTURE: Organism ID, Bacteria: NO GROWTH

## 2024-12-12 ENCOUNTER — Other Ambulatory Visit (HOSPITAL_COMMUNITY)

## 2024-12-13 ENCOUNTER — Other Ambulatory Visit: Payer: Self-pay | Admitting: Family Medicine

## 2024-12-13 DIAGNOSIS — F419 Anxiety disorder, unspecified: Secondary | ICD-10-CM

## 2024-12-20 ENCOUNTER — Telehealth: Payer: Self-pay

## 2024-12-20 NOTE — Telephone Encounter (Signed)
 Called patient schedule appointment for Thursday with Camie moats, [name] Copied from CRM [id number]. Topic: Referral - Request for Referral >> Dec 20, 2024  3:56 PM Kevelyn M wrote: Did the patient discuss referral with their provider in the last year? Yes (If No - schedule appointment) (If Yes - send message)  Appointment offered? No  Type of order/referral and detailed reason for visit: Nose bleed  Preference of office, provider, location: ENT (Dr. Honor) on [address] in [place]  If referral order, have you been seen by this specialty before? Yes (If Yes, this issue or another issue? When? Where?  Can we respond through MyChart? Yes

## 2024-12-22 ENCOUNTER — Ambulatory Visit: Admitting: Physician Assistant

## 2024-12-22 ENCOUNTER — Encounter: Payer: Self-pay | Admitting: Physician Assistant

## 2024-12-22 VITALS — BP 112/66 | HR 62 | Temp 98.0°F | Ht 65.0 in | Wt 146.0 lb

## 2024-12-22 DIAGNOSIS — R040 Epistaxis: Secondary | ICD-10-CM

## 2024-12-22 NOTE — Progress Notes (Signed)
 "  Subjective:  Patient ID: Lori Wise, female    DOB: [birth year]  Age: 64 y.o. MRN: [id number]  Chief Complaint  Patient presents with   Epistaxis    HPI Pt in today with complaints of nosebleeds.  She states a few days ago she had a bad nosebleed from her right nostril.  It did take awhile for it to stop.  She states she still is having drainage down back of her throat.  A few years ago she had issues with same nostril causing moderate epistaxis to point she had to have cauterization.  She would like to go ahead and have ENT referral at this time for further management/evaluation Currently not having nosebleed today     12/22/2024    1:09 PM 10/03/2024   11:57 AM 09/12/2024   10:29 AM 05/12/2024    9:03 AM 01/11/2024    8:19 AM  Depression screen PHQ 2/9  Decreased Interest 0 0 0 0 2  Down, Depressed, Hopeless 0 0 0 0 2  PHQ - 2 Score 0 0 0 0 4  Altered sleeping    0 2  Tired, decreased energy    0 2  Change in appetite    0 3  Feeling bad or failure about yourself     0 0  Trouble concentrating    0 0  Moving slowly or fidgety/restless    0 0  Suicidal thoughts    0 0  PHQ-9 Score    0  11   Difficult doing work/chores    Not difficult at all Somewhat difficult     Data saved with a previous flowsheet row definition        08/04/2023   10:39 AM 01/11/2024    8:19 AM 05/12/2024    9:03 AM 09/12/2024   10:29 AM 12/22/2024    1:09 PM  Fall Risk  Falls in the past year? 0 0 0 0 0  Was there an injury with Fall? 0  0  0  0  0  Fall Risk Category Calculator  0 0 0 0  Patient at Risk for Falls Due to No Fall Risks No Fall Risks No Fall Risks No Fall Risks No Fall Risks  Fall risk Follow up Falls evaluation completed Falls evaluation completed Falls evaluation completed Falls evaluation completed Falls evaluation completed     Data saved with a previous flowsheet row definition     ROS CONSTITUTIONAL: Negative for chills, fatigue, fever, E/N/T: Negative for ear pain, nasal  congestion and sore throat.  CARDIOVASCULAR: Negative for chest pain,  RESPIRATORY: Negative for recent cough and dyspnea.   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)    Migraine without aura, not intractable, without status migrainosus    Mixed hyperlipidemia    Other chest pain 03/21/2021   Other specified hypothyroidism 04/19/2020   Primary hypertension 03/21/2021   Right leg pain 07/23/2020   Objective:  PHYSICAL EXAM:   BP 112/66   Pulse 62   Temp 98 F (36.7  C)   Ht 5' 5 (1.651 m)   Wt 146 lb (66.2 kg)   SpO2 98%   BMI 24.30 kg/m    GEN: Well nourished, well developed, in no acute distress  HEENT: normal external ears and nose - normal external auditory canals and TMS - - Lips, Teeth and Gums - normal  Nares hyperemic but no active bleeding noted Oropharynx - normal mucosa, palate, and posterior pharynx Cardiac: RRR; no murmurs,  Respiratory:  normal respiratory rate and pattern with no distress - normal breath sounds with no rales, rhonchi, wheezes or rubs     Assessment & Plan Epistaxis Recommend nasal saline spray bid  Will refer to ENT Orders:   Ambulatory referral to ENT    Follow-up: Return if symptoms worsen or fail to improve.  An After Visit Summary was printed and given to the patient.  SARA R Floride Hutmacher, [name] [facility] [phone]    [1]  Current Outpatient Medications:    ALPRAZolam  (XANAX ) 0.5 MG tablet, TAKE 1  TABLET BY MOUTH 3 TIMES DAILY, Disp: 90 tablet, Rfl: 0   anastrozole  (ARIMIDEX ) 1 MG tablet, Take 1 tablet (1 mg total) by mouth daily., Disp: 90 tablet, Rfl: 3   Ascorbic Acid (VITAMIN C PO), Take 1 tablet by mouth daily. Unknown strength, Disp: , Rfl:    aspirin EC 81 MG tablet, Take 81 mg by mouth daily. Swallow whole., Disp: , Rfl:    atenolol  (TENORMIN ) 25 MG tablet, TAKE ONE TABLET BY MOUTH TWICE DAILY, Disp: 60 tablet, Rfl: 0   cetirizine  (ZYRTEC ) 10 MG tablet, TAKE ONE TABLET BY MOUTH DAILY, Disp: 30 tablet, Rfl: 11   Cholecalciferol (VITAMIN D3) 25 MCG (1000 UT) CAPS, Take 1 Units by mouth daily., Disp: , Rfl:    Cyanocobalamin (VITAMIN B-12 PO), Take 1 tablet by mouth daily. Unknown strenght, Disp: , Rfl:    Emollient (ROC RETINOL CORREXION EX), Apply topically., Disp: , Rfl:    ezetimibe  (ZETIA ) 10 MG tablet, TAKE ONE TABLET BY MOUTH EVERY DAY, Disp: 30 tablet, Rfl: 5   Folic Acid-Vit B6-Vit B12 (FOLBEE) 2.5-25-1 MG TABS tablet, Take 1 tablet by mouth daily., Disp: , Rfl:    ibuprofen  (ADVIL ) 800 MG tablet, Take 1 tablet (800 mg total) by mouth every 8 (eight) hours as needed for mild pain (pain score 1-3) or moderate pain (pain score 4-6)., Disp: 90 tablet, Rfl: 1   ipratropium-albuterol  (DUONEB) 0.5-2.5 (3) MG/3ML SOLN, Take 3 mLs by nebulization every 4 (four) hours as needed., Disp: 360 mL, Rfl: 3   levothyroxine (SYNTHROID) 25 MCG tablet, TAKE ONE TABLET BY MOUTH EVERY DAY, Disp: 90 tablet, Rfl: 1   Omega-3 1000 MG CAPS, Take 1 capsule by mouth daily., Disp: , Rfl:    omeprazole  (PRILOSEC) 20 MG capsule, Take 1 capsule (20 mg total) by mouth 2 (two) times daily before a meal., Disp: 180 capsule, Rfl: 1   rosuvastatin  (CRESTOR ) 5 MG tablet, Take 1 tablet (5 mg total) by mouth daily., Disp: 90 tablet, Rfl: 1   tretinoin (RETIN-A) 0.05 % cream, Apply topically at bedtime., Disp: , Rfl:    VITAMIN E PO, Take 1 tablet by mouth daily at 6 (six) AM. Unknown strength, Disp: , Rfl:    Current Facility-Administered Medications:    triamcinolone  acetonide (KENALOG -40) injection 60 mg, 60 mg, Intramuscular, Once,   "

## 2024-12-22 NOTE — Assessment & Plan Note (Signed)
 Recommend nasal saline spray bid  Will refer to ENT Orders:   Ambulatory referral to ENT

## 2024-12-27 ENCOUNTER — Inpatient Hospital Stay: Attending: Adult Health

## 2024-12-27 ENCOUNTER — Inpatient Hospital Stay: Attending: Adult Health | Admitting: Adult Health

## 2024-12-27 VITALS — BP 137/54 | HR 75 | Temp 97.7°F | Resp 17 | Wt 145.2 lb

## 2024-12-27 DIAGNOSIS — D509 Iron deficiency anemia, unspecified: Secondary | ICD-10-CM

## 2024-12-27 DIAGNOSIS — C50311 Malignant neoplasm of lower-inner quadrant of right female breast: Secondary | ICD-10-CM

## 2024-12-27 DIAGNOSIS — Z170 Estrogen receptor positive status [ER+]: Secondary | ICD-10-CM

## 2024-12-27 LAB — CBC WITH DIFFERENTIAL (CANCER CENTER ONLY)
Abs Immature Granulocytes: 0.02 K/uL (ref 0.00–0.07)
Basophils Absolute: 0.1 K/uL (ref 0.0–0.1)
Basophils Relative: 1 %
Eosinophils Absolute: 0.3 K/uL (ref 0.0–0.5)
Eosinophils Relative: 4 %
HCT: 35.7 % — ABNORMAL LOW (ref 36.0–46.0)
Hemoglobin: 11.9 g/dL — ABNORMAL LOW (ref 12.0–15.0)
Immature Granulocytes: 0 %
Lymphocytes Relative: 37 %
Lymphs Abs: 2.9 K/uL (ref 0.7–4.0)
MCH: 29.4 pg (ref 26.0–34.0)
MCHC: 33.3 g/dL (ref 30.0–36.0)
MCV: 88.1 fL (ref 80.0–100.0)
Monocytes Absolute: 0.6 K/uL (ref 0.1–1.0)
Monocytes Relative: 7 %
Neutro Abs: 4.1 K/uL (ref 1.7–7.7)
Neutrophils Relative %: 51 %
Platelet Count: 307 K/uL (ref 150–400)
RBC: 4.05 MIL/uL (ref 3.87–5.11)
RDW: 14.4 % (ref 11.5–15.5)
WBC Count: 7.9 K/uL (ref 4.0–10.5)
nRBC: 0 % (ref 0.0–0.2)

## 2024-12-27 LAB — IRON AND IRON BINDING CAPACITY (CC-WL,HP ONLY)
Iron: 77 ug/dL (ref 28–170)
Saturation Ratios: 25 % (ref 10.4–31.8)
TIBC: 308 ug/dL (ref 250–450)
UIBC: 231 ug/dL

## 2024-12-27 LAB — RETIC PANEL
Immature Retic Fract: 9.9 % (ref 2.3–15.9)
RBC.: 4.07 MIL/uL (ref 3.87–5.11)
Retic Count, Absolute: 58.6 K/uL (ref 19.0–186.0)
Retic Ct Pct: 1.4 % (ref 0.4–3.1)
Reticulocyte Hemoglobin: 34.1 pg

## 2024-12-27 LAB — FERRITIN: Ferritin: 214 ng/mL (ref 11–307)

## 2024-12-27 LAB — CMP (CANCER CENTER ONLY)
ALT: 15 U/L (ref 0–44)
AST: 27 U/L (ref 15–41)
Albumin: 4.5 g/dL (ref 3.5–5.0)
Alkaline Phosphatase: 61 U/L (ref 38–126)
Anion gap: 11 (ref 5–15)
BUN: 12 mg/dL (ref 8–23)
CO2: 25 mmol/L (ref 22–32)
Calcium: 9.8 mg/dL (ref 8.9–10.3)
Chloride: 107 mmol/L (ref 98–111)
Creatinine: 0.73 mg/dL (ref 0.44–1.00)
GFR, Estimated: >60 mL/min
Glucose, Bld: 103 mg/dL — ABNORMAL HIGH (ref 70–99)
Potassium: 3.4 mmol/L — ABNORMAL LOW (ref 3.5–5.1)
Sodium: 143 mmol/L (ref 135–145)
Total Bilirubin: 0.4 mg/dL (ref 0.0–1.2)
Total Protein: 7.3 g/dL (ref 6.5–8.1)

## 2024-12-27 LAB — C-REACTIVE PROTEIN: CRP: <0.5 mg/dL

## 2024-12-27 LAB — HEPATITIS B CORE ANTIBODY, IGM: Hep B C IgM: NONREACTIVE

## 2024-12-27 LAB — HIV ANTIBODY (ROUTINE TESTING W REFLEX): HIV Screen 4th Generation wRfx: NONREACTIVE

## 2024-12-27 LAB — HEPATITIS C ANTIBODY: HCV Ab: NONREACTIVE

## 2024-12-27 LAB — SEDIMENTATION RATE: Sed Rate: 8 mm/h (ref 0–22)

## 2024-12-27 NOTE — Progress Notes (Unsigned)
 [facility] Cancer Follow up:    Lori Credit, [name] [address] [place] [address]   DIAGNOSIS: Cancer Staging  Malignant neoplasm of lower-inner quadrant of right breast of female, estrogen receptor positive (HCC) Staging form: Breast, AJCC 8th Edition - Clinical: Stage IA (cT1c, cN0, cM0, G1, ER+, PR+, HER2-) - Signed by Odean Potts, MD on 10/21/2023 Stage prefix: Initial diagnosis Histologic grading system: 3 grade system    SUMMARY OF ONCOLOGIC HISTORY: Oncology History  Malignant neoplasm of lower-inner quadrant of right breast of female, estrogen receptor positive (HCC)  10/15/2023 Initial Diagnosis   Screening mammogram detected right breast distortion at 6:00 measuring 1.1 cm, stereotactic biopsy: Grade 1 ILC ER 95%, PR 95%, Ki-67 3%, HER2 negative, axilla negative (left breast biopsy: Benign)   10/21/2023 Cancer Staging   Staging form: Breast, AJCC 8th Edition - Clinical: Stage IA (cT1c, cN0, cM0, G1, ER+, PR+, HER2-) - Signed by Odean Potts, MD on 10/21/2023 Stage prefix: Initial diagnosis Histologic grading system: 3 grade system    Genetic Testing   [facility] CancerNext-Expanded Panel+RNA was Negative. Report date is 11/13/2023.   The CancerNext-Expanded gene panel offered by [facility] and includes sequencing, rearrangement, and RNA analysis for the following 76 genes: AIP, ALK, APC, ATM, AXIN2, BAP1, BARD1, BMPR1A, BRCA1, BRCA2, BRIP1, CDC73, CDH1, CDK4, CDKN1B, CDKN2A, CEBPA, CHEK2, CTNNA1, DDX41, DICER1, ETV6, FH, FLCN, GATA2, LZTR1, MAX, MBD4, MEN1, MET, MLH1, MSH2, MSH3, MSH6, MUTYH, NF1, NF2, NTHL1, PALB2, PHOX2B, PMS2, POT1, PRKAR1A, PTCH1, PTEN, RAD51C, RAD51D, RB1, RET, RUNX1, SDHA, SDHAF2, SDHB, SDHC, SDHD, SMAD4, SMARCA4, SMARCB1, SMARCE1, STK11, SUFU, TMEM127, TP53, TSC1, TSC2, VHL, and WT1 (sequencing and deletion/duplication); EGFR, HOXB13, KIT, MITF, PDGFRA, POLD1, and POLE (sequencing only); EPCAM and GREM1 (deletion/duplication  only).     11/11/2023 Surgery   Right lumpectomy: Grade 1 ILC 1.4 cm with LCIS, negative for lymphovascular invasion, 0/8 lymph nodes negative, ER 95%, PR 95%, HER2 1+ negative, Ki-67 3%   12/29/2023 - 01/27/2024 Radiation Therapy   First Treatment Date: 2023-12-29 Last Treatment Date: 2024-01-27   Plan Name: Breast_R Site: Breast, Right Technique: 3D Mode: Photon Dose Per Fraction: 2.66 Gy Prescribed Dose (Delivered / Prescribed): 42.56 Gy / 42.56 Gy Prescribed Fxs (Delivered / Prescribed): 16 / 16   Plan Name: Breast_R_Bst Site: Breast, Right Technique: Electron Mode: Electron Dose Per Fraction: 2 Gy Prescribed Dose (Delivered / Prescribed): 10 Gy / 10 Gy Prescribed Fxs (Delivered / Prescribed): 5 / 5   01/2024 -  Anti-estrogen oral therapy   Anastrozole      CURRENT THERAPY:  INTERVAL HISTORY:  Discussed the use of AI scribe software for clinical note transcription with the patient, who gave verbal consent to proceed.  History of Present Illness      Patient Active Problem List   Diagnosis Date Noted   Osteoporosis 08/09/2024   Hyperglycemia 01/11/2024   Genetic testing 11/04/2023   Chronic obstructive pulmonary disease with (acute) lower respiratory infection (HCC) 10/21/2023   Malignant neoplasm of lower-inner quadrant of right breast of female, estrogen receptor positive (HCC) 10/19/2023   Epistaxis 05/06/2023   Acute cystitis with hematuria 04/16/2023   Neck pain 04/16/2023   Tobacco abuse 01/28/2023   Allergic rhinitis due to allergen 04/15/2022   Need for pneumococcal 20-valent conjugate vaccination 04/15/2022   Vitamin D  deficiency 04/15/2022   Smoking 08/30/2021   Essential (primary) hypertension    Migraine without aura, not intractable, without status migrainosus    Sunburn of second degree  Atypical chest pain 03/21/2021   Primary hypertension 03/21/2021   Chronic obstructive pulmonary disease (HCC) 10/22/2020   Hormone  replacement therapy (HRT) 10/22/2020   GERD without esophagitis 10/22/2020   Right leg pain 07/23/2020   Bronchitis 04/30/2020   Mixed hyperlipidemia 04/19/2020   Other specified hypothyroidism 04/19/2020   Anxiety 04/19/2020   Acute laryngopharyngitis 04/19/2020   Encounter for immunization 04/19/2020   Atypical nevus 04/19/2020    is allergic to codeine, macrobid  [nitrofurantoin ], and ativan [lorazepam].  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)    Migraine without aura, not intractable, without status migrainosus    Mixed hyperlipidemia    Other chest pain 03/21/2021   Other specified hypothyroidism 04/19/2020   Primary hypertension 03/21/2021   Right leg pain 07/23/2020    SURGICAL HISTORY: Past Surgical History:  Procedure Laterality Date   ABDOMINAL HYSTERECTOMY     BREAST BIOPSY Left 10/15/2023   MM LT BREAST BX W LOC DEV 1ST LESION IMAGE BX SPEC STEREO GUIDE 10/15/2023 [facility]   BREAST BIOPSY Right 10/15/2023   MM RT BREAST BX W LOC DEV 1ST LESION IMAGE BX SPEC STEREO GUIDE 10/15/2023 [facility]   BREAST BIOPSY  11/10/2023   MM RT RADIOACTIVE SEED LOC MAMMO GUIDE 11/10/2023 [facility]   BREAST LUMPECTOMY WITH RADIOACTIVE SEED AND SENTINEL LYMPH NODE BIOPSY Right 11/11/2023   Procedure: RIGHT BREAST SEED LOCALIZED  LUMPECTOMY AND SENTINEL NODE BIOPSY;  Surgeon: Vernetta Berg, MD;  Location: [facility];  Service: General;  Laterality: Right;   CARDIAC CATHETERIZATION  2000   normal   CHOLECYSTECTOMY     NASAL HEMORRHAGE CONTROL     OTHER SURGICAL HISTORY     Ear surgeries x5    SOCIAL HISTORY: Social History   Socioeconomic History   Marital status: Legally Separated    Spouse name: Not on file   Number of children: 2   Years of education: Not on file   Highest education level: 12th grade  Occupational History   Occupation: [employer] service  Tobacco Use   Smoking status: Former    Current packs/day: 0.00    Types: Cigarettes    Quit date: 05/28/2023    Years since quitting: 1.5   Smokeless tobacco: Never  Vaping Use   Vaping status: Never Used  Substance and Sexual Activity   Alcohol use: Not Currently    Comment: Drinks approximately two times per week.   Drug use: Never   Sexual activity: Not on file  Other Topics Concern   Not on file  Social History Narrative   Not on file   Social Drivers of Health   Tobacco Use: Medium Risk (12/22/2024)   Patient History    Smoking Tobacco Use: Former    Smokeless Tobacco Use: Never    Passive Exposure: Not on file  Financial Resource Strain: Low Risk (08/04/2023)   Overall Financial Resource Strain (CARDIA)    Difficulty of Paying Living Expenses: Not hard at all  Food Insecurity: No Food Insecurity (10/21/2023)   Hunger  Vital Sign    Worried About Programme Researcher, Broadcasting/film/video in the Last Year: Never true    Ran Out of Food in the Last Year: Never true  Transportation Needs: No Transportation Needs (10/21/2023)   PRAPARE - Administrator, Civil Service (Medical): No    Lack of Transportation (Non-Medical): No  Physical Activity: Inactive (08/04/2023)   Exercise Vital Sign    Days of Exercise per Week: 0 days    Minutes of Exercise per Session: 0 min  Stress: No Stress Concern Present (08/04/2023)   [employer] of Occupational Health - Occupational Stress  Questionnaire    Feeling of Stress : Not at all  Social Connections: Unknown (08/04/2023)   Social Connection and Isolation Panel    Frequency of Communication with Friends and Family: More than three times a week    Frequency of Social Gatherings with Friends and Family: Three times a week    Attends Religious Services: Patient declined    Active Member of Clubs or Organizations: No    Attends Banker Meetings: Never    Marital Status: Patient declined  Intimate Partner Violence: Not At Risk (10/21/2023)   Humiliation, Afraid, Rape, and Kick questionnaire    Fear of Current or Ex-Partner: No    Emotionally Abused: No    Physically Abused: No    Sexually Abused: No  Depression (PHQ2-9): Low Risk (12/22/2024)   Depression (PHQ2-9)    PHQ-2 Score: 0  Alcohol Screen: Low Risk (08/04/2023)   Alcohol Screen    Last Alcohol Screening Score (AUDIT): 0  Housing: Low Risk (10/21/2023)   Housing    Last Housing Risk Score: 0  Utilities: Not At Risk (10/21/2023)   AHC Utilities    Threatened with loss of utilities: No  Health Literacy: Adequate Health Literacy (08/04/2023)   B1300 Health Literacy    Frequency of need for help with medical instructions: Never    FAMILY HISTORY: Family History  Problem Relation Age of Onset   Suicidality Mother    Lung cancer Father 39       smoked   Lung cancer Sister 65       smoked   Brain cancer Brother 17   Lung cancer Brother 2       smoked   Breast cancer Paternal Aunt    Prostate cancer Paternal Uncle 3 - 99   Breast cancer Paternal Grandmother 43 - 28    Review of Systems - Oncology    PHYSICAL EXAMINATION    There were no vitals filed for this visit.  Physical Exam  LABORATORY DATA:  CBC    Component Value Date/Time   WBC 15.3 (H) 12/07/2024 1110   WBC 8.1 10/03/2024 1125   RBC 4.05 12/07/2024 1110   RBC 4.46 10/03/2024 1125   HGB 12.2 12/07/2024 1110   HCT 37.1 12/07/2024 1110    PLT 324 12/07/2024 1110   MCV 92 12/07/2024 1110   MCH 30.1 12/07/2024 1110   MCH 29.8 10/03/2024 1125   MCHC 32.9 12/07/2024 1110   MCHC 34.1 10/03/2024 1125   RDW 13.4 12/07/2024 1110   LYMPHSABS 2.2 12/07/2024 1110   MONOABS 0.9 10/03/2024 1125   EOSABS 0.4 12/07/2024 1110   BASOSABS 0.1 12/07/2024 1110    CMP     Component Value Date/Time   NA 144 11/14/2024 1059   K 3.9 11/14/2024 1059   CL 108 (H) 11/14/2024 1059   CO2 21 11/14/2024 1059   GLUCOSE  117 (H) 11/14/2024 1059   GLUCOSE 105 (H) 10/03/2024 1125   BUN 7 (L) 11/14/2024 1059   CREATININE 0.83 11/14/2024 1059   CREATININE 0.86 10/03/2024 1125   CALCIUM  9.4 11/14/2024 1059   PROT 6.2 11/14/2024 1059   ALBUMIN 4.1 11/14/2024 1059   AST 24 11/14/2024 1059   AST 24 10/03/2024 1125   ALT 17 11/14/2024 1059   ALT 26 10/03/2024 1125   ALKPHOS 84 11/14/2024 1059   BILITOT 0.3 11/14/2024 1059   BILITOT 0.4 10/03/2024 1125   GFRNONAA >60 10/03/2024 1125   GFRAA 116 02/01/2021 1022     ASSESSMENT and THERAPY PLAN:   No problem-specific Assessment & Plan notes found for this encounter.   Assessment and Plan Assessment & Plan       All questions were answered. The patient knows to call the clinic with any problems, questions or concerns. We can certainly see the patient much sooner if necessary.  Total encounter time:*** minutes*in face-to-face visit time, chart review, lab review, care coordination, order entry, and documentation of the encounter time.    Morna Kendall, NP 12/27/24 1:16 PM Medical Oncology and Hematology [facility] [address], [place] [address] Tel. [phone]    Fax. [phone]  *Total Encounter Time as defined by the Centers for Medicare and Medicaid Services includes, in addition to the face-to-face time of a patient visit (documented in the note above) non-face-to-face time: obtaining and reviewing outside history, ordering and reviewing medications,  tests or procedures, care coordination (communications with other health care professionals or caregivers) and documentation in the medical record.

## 2024-12-28 LAB — HEPATITIS B CORE ANTIBODY, TOTAL: HEP B CORE AB: NEGATIVE

## 2024-12-28 LAB — ANTINUCLEAR ANTIBODIES, IFA: ANA Ab, IFA: NEGATIVE

## 2024-12-29 ENCOUNTER — Encounter: Payer: Self-pay | Admitting: Hematology and Oncology

## 2024-12-29 ENCOUNTER — Other Ambulatory Visit: Payer: Self-pay | Admitting: Physician Assistant

## 2024-12-29 DIAGNOSIS — K219 Gastro-esophageal reflux disease without esophagitis: Secondary | ICD-10-CM

## 2024-12-29 NOTE — Assessment & Plan Note (Signed)
 10/15/2023:Screening mammogram detected right breast distortion at 6:00 measuring 1.1 cm, stereotactic biopsy: Grade 1 ILC ER 95%, PR 95%, Ki-67 3%, HER2 negative, axilla negative (left breast biopsy: Benign)   11/11/2023:Right lumpectomy: Grade 1 ILC 1.4 cm with LCIS, negative for lymphovascular invasion, 0/8 lymph nodes negative, ER 95%, PR 95%, HER2 1+ negative, Ki-67 3% Oncotype DX recurrence score: 15 (4% risk of distant recurrence)   Recommendations: 1. Adjuvant radiation therapy 12/31/2023-01/27/2024 2. Adjuvant antiestrogen therapy: Anastrozole  started 02/01/24   Estrogen replacement therapy: Patient was able to come off of estrogen replacement therapy.   Anastrozole  Toxicities: tolerating without difficulty.  Will continue  Breast Cancer Surveillance: Breast Exam: 08/09/24: Benign  Recent lab changes with mildly elevated ferritin and one lab test with decreased iron saturation.   -Will repeat lab testing including iron studies, inflammatory markers, liver enzymes, and potential infectious etiologies.

## 2025-01-02 ENCOUNTER — Ambulatory Visit

## 2025-01-05 ENCOUNTER — Ambulatory Visit: Payer: Self-pay

## 2025-01-05 NOTE — Telephone Encounter (Signed)
-----   Message from Morna Kendall, NP sent at 01/05/2025  1:59 PM EST ----- Please call patient and let her know all her labs were normal and her ferritin has since normalized.  I reviewed with Dr. Odean who attributes the temporary elevation in ferritin to inflammation in  her body.    Thanks, LC

## 2025-01-05 NOTE — Telephone Encounter (Signed)
 Message from provider was communicated to the patient. Patient received the information well and acknowledged understanding with no questions or concerns.

## 2025-01-08 ENCOUNTER — Encounter: Payer: Self-pay | Admitting: Hematology and Oncology

## 2025-01-09 ENCOUNTER — Ambulatory Visit

## 2025-01-10 ENCOUNTER — Other Ambulatory Visit: Payer: Self-pay | Admitting: Family Medicine

## 2025-01-10 ENCOUNTER — Other Ambulatory Visit: Payer: Self-pay | Admitting: Physician Assistant

## 2025-01-10 DIAGNOSIS — F419 Anxiety disorder, unspecified: Secondary | ICD-10-CM

## 2025-01-10 DIAGNOSIS — I10 Essential (primary) hypertension: Secondary | ICD-10-CM

## 2025-01-16 ENCOUNTER — Ambulatory Visit: Admitting: Physician Assistant

## 2025-01-23 ENCOUNTER — Ambulatory Visit

## 2025-05-05 ENCOUNTER — Inpatient Hospital Stay: Attending: Adult Health | Admitting: Adult Health

## 2025-05-05 ENCOUNTER — Inpatient Hospital Stay

## 2025-10-03 ENCOUNTER — Inpatient Hospital Stay: Attending: Adult Health | Admitting: Hematology and Oncology

## 2025-10-03 ENCOUNTER — Inpatient Hospital Stay
